# Patient Record
Sex: Female | Born: 1975 | Race: Black or African American | Hispanic: No | Marital: Married | Smoking: Never smoker
Health system: Southern US, Community
[De-identification: ages and names within clinical notes are randomized; demographics above are authoritative.]

## PROBLEM LIST (undated history)

## (undated) DIAGNOSIS — E079 Disorder of thyroid, unspecified: Secondary | ICD-10-CM

## (undated) DIAGNOSIS — G932 Benign intracranial hypertension: Secondary | ICD-10-CM

## (undated) DIAGNOSIS — I2699 Other pulmonary embolism without acute cor pulmonale: Secondary | ICD-10-CM

## (undated) DIAGNOSIS — F32A Depression, unspecified: Secondary | ICD-10-CM

## (undated) DIAGNOSIS — M797 Fibromyalgia: Secondary | ICD-10-CM

## (undated) DIAGNOSIS — I7300 Raynaud's syndrome without gangrene: Secondary | ICD-10-CM

## (undated) DIAGNOSIS — Z8719 Personal history of other diseases of the digestive system: Secondary | ICD-10-CM

## (undated) DIAGNOSIS — H539 Unspecified visual disturbance: Secondary | ICD-10-CM

## (undated) DIAGNOSIS — K589 Irritable bowel syndrome without diarrhea: Secondary | ICD-10-CM

## (undated) DIAGNOSIS — J45909 Unspecified asthma, uncomplicated: Secondary | ICD-10-CM

## (undated) DIAGNOSIS — K219 Gastro-esophageal reflux disease without esophagitis: Secondary | ICD-10-CM

## (undated) DIAGNOSIS — F329 Major depressive disorder, single episode, unspecified: Secondary | ICD-10-CM

## (undated) DIAGNOSIS — D649 Anemia, unspecified: Secondary | ICD-10-CM

## (undated) DIAGNOSIS — I10 Essential (primary) hypertension: Secondary | ICD-10-CM

## (undated) DIAGNOSIS — F419 Anxiety disorder, unspecified: Secondary | ICD-10-CM

## (undated) HISTORY — DX: Depression, unspecified: F32.A

## (undated) HISTORY — DX: Raynaud's syndrome without gangrene: I73.00

## (undated) HISTORY — DX: Fibromyalgia: M79.7

## (undated) HISTORY — DX: Anemia, unspecified: D64.9

## (undated) HISTORY — PX: RHINOPLASTY: SUR1284

## (undated) HISTORY — DX: Benign intracranial hypertension: G93.2

## (undated) HISTORY — DX: Major depressive disorder, single episode, unspecified: F32.9

## (undated) HISTORY — DX: Unspecified visual disturbance: H53.9

## (undated) HISTORY — DX: Disorder of thyroid, unspecified: E07.9

## (undated) HISTORY — DX: Essential (primary) hypertension: I10

## (undated) HISTORY — DX: Irritable bowel syndrome, unspecified: K58.9

## (undated) HISTORY — DX: Gastro-esophageal reflux disease without esophagitis: K21.9

## (undated) HISTORY — DX: Anxiety disorder, unspecified: F41.9

---

## 2001-01-16 ENCOUNTER — Emergency Department (HOSPITAL_COMMUNITY): Admission: EM | Admit: 2001-01-16 | Discharge: 2001-01-16 | Payer: Self-pay | Admitting: *Deleted

## 2002-08-10 HISTORY — PX: TONSILECTOMY, ADENOIDECTOMY, BILATERAL MYRINGOTOMY AND TUBES: SHX2538

## 2003-11-17 ENCOUNTER — Emergency Department (HOSPITAL_COMMUNITY): Admission: AD | Admit: 2003-11-17 | Discharge: 2003-11-17 | Payer: Self-pay | Admitting: Family Medicine

## 2005-02-05 ENCOUNTER — Emergency Department: Payer: Self-pay | Admitting: Emergency Medicine

## 2005-02-06 ENCOUNTER — Other Ambulatory Visit: Payer: Self-pay

## 2005-06-17 ENCOUNTER — Inpatient Hospital Stay: Payer: Self-pay

## 2005-06-17 ENCOUNTER — Other Ambulatory Visit: Payer: Self-pay

## 2007-04-20 ENCOUNTER — Ambulatory Visit: Payer: Self-pay | Admitting: Family Medicine

## 2007-06-01 ENCOUNTER — Ambulatory Visit: Payer: Self-pay | Admitting: General Surgery

## 2007-07-15 ENCOUNTER — Ambulatory Visit: Payer: Self-pay | Admitting: Unknown Physician Specialty

## 2007-07-20 ENCOUNTER — Ambulatory Visit: Payer: Self-pay | Admitting: Unknown Physician Specialty

## 2007-07-21 ENCOUNTER — Ambulatory Visit: Payer: Self-pay | Admitting: Unknown Physician Specialty

## 2007-11-30 ENCOUNTER — Emergency Department: Payer: Self-pay | Admitting: Emergency Medicine

## 2008-11-08 ENCOUNTER — Ambulatory Visit: Payer: Self-pay | Admitting: Internal Medicine

## 2008-11-14 ENCOUNTER — Ambulatory Visit: Payer: Self-pay | Admitting: Internal Medicine

## 2008-12-08 ENCOUNTER — Ambulatory Visit: Payer: Self-pay | Admitting: Internal Medicine

## 2008-12-17 ENCOUNTER — Ambulatory Visit: Payer: Self-pay | Admitting: Family Medicine

## 2008-12-21 ENCOUNTER — Ambulatory Visit: Payer: Self-pay

## 2009-03-05 ENCOUNTER — Encounter: Payer: Self-pay | Admitting: Neurology

## 2009-03-10 ENCOUNTER — Encounter: Payer: Self-pay | Admitting: Neurology

## 2009-07-29 ENCOUNTER — Emergency Department: Payer: Self-pay | Admitting: Emergency Medicine

## 2009-09-04 ENCOUNTER — Ambulatory Visit: Payer: Self-pay | Admitting: Hematology and Oncology

## 2010-10-20 ENCOUNTER — Inpatient Hospital Stay (INDEPENDENT_AMBULATORY_CARE_PROVIDER_SITE_OTHER)
Admission: RE | Admit: 2010-10-20 | Discharge: 2010-10-20 | Disposition: A | Discharge status: Home / Self Care | Payer: Self-pay | Source: Ambulatory Visit | Attending: Family Medicine | Admitting: Family Medicine

## 2010-10-20 DIAGNOSIS — R21 Rash and other nonspecific skin eruption: Secondary | ICD-10-CM

## 2010-10-20 DIAGNOSIS — R6889 Other general symptoms and signs: Secondary | ICD-10-CM

## 2010-11-04 ENCOUNTER — Emergency Department (HOSPITAL_COMMUNITY)
Admission: EM | Admit: 2010-11-04 | Discharge: 2010-11-05 | Disposition: A | Discharge status: Home / Self Care | Payer: Self-pay | Attending: Emergency Medicine | Admitting: Emergency Medicine

## 2010-11-04 ENCOUNTER — Emergency Department (HOSPITAL_COMMUNITY): Payer: Self-pay

## 2010-11-04 DIAGNOSIS — R112 Nausea with vomiting, unspecified: Secondary | ICD-10-CM | POA: Insufficient documentation

## 2010-11-04 DIAGNOSIS — N390 Urinary tract infection, site not specified: Secondary | ICD-10-CM | POA: Insufficient documentation

## 2010-11-04 DIAGNOSIS — R780 Finding of alcohol in blood: Secondary | ICD-10-CM | POA: Insufficient documentation

## 2010-11-04 DIAGNOSIS — N898 Other specified noninflammatory disorders of vagina: Secondary | ICD-10-CM | POA: Insufficient documentation

## 2010-11-04 DIAGNOSIS — I10 Essential (primary) hypertension: Secondary | ICD-10-CM | POA: Insufficient documentation

## 2010-11-04 DIAGNOSIS — Z79899 Other long term (current) drug therapy: Secondary | ICD-10-CM | POA: Insufficient documentation

## 2010-11-04 LAB — CBC
Hemoglobin: 12 g/dL (ref 12.0–15.0)
MCH: 27.1 pg (ref 26.0–34.0)
MCHC: 34.5 g/dL (ref 30.0–36.0)
RDW: 16 % — ABNORMAL HIGH (ref 11.5–15.5)

## 2010-11-04 LAB — URINALYSIS, ROUTINE W REFLEX MICROSCOPIC
Bilirubin Urine: NEGATIVE
Glucose, UA: NEGATIVE mg/dL
Ketones, ur: NEGATIVE mg/dL
Nitrite: NEGATIVE
Protein, ur: NEGATIVE mg/dL
Specific Gravity, Urine: 1.018 (ref 1.005–1.030)
Urobilinogen, UA: 0.2 mg/dL (ref 0.0–1.0)
pH: 5.5 (ref 5.0–8.0)

## 2010-11-04 LAB — COMPREHENSIVE METABOLIC PANEL
AST: 15 U/L (ref 0–37)
CO2: 25 mEq/L (ref 19–32)
Calcium: 8.9 mg/dL (ref 8.4–10.5)
Creatinine, Ser: 0.85 mg/dL (ref 0.4–1.2)
GFR calc Af Amer: >60 mL/min (ref >60)
GFR calc non Af Amer: >60 mL/min (ref >60)

## 2010-11-04 LAB — URINE MICROSCOPIC-ADD ON

## 2010-11-04 LAB — DIFFERENTIAL
Basophils Absolute: 0 10*3/uL (ref 0.0–0.1)
Basophils Relative: 0 % (ref 0–1)
Eosinophils Absolute: 0.2 10*3/uL (ref 0.0–0.7)
Eosinophils Relative: 2 % (ref 0–5)
Lymphocytes Relative: 28 % (ref 12–46)
Lymphs Abs: 2.9 10*3/uL (ref 0.7–4.0)
Monocytes Absolute: 0.6 10*3/uL (ref 0.1–1.0)
Monocytes Relative: 6 % (ref 3–12)
Neutro Abs: 6.5 10*3/uL (ref 1.7–7.7)
Neutrophils Relative %: 64 % (ref 43–77)

## 2010-11-04 LAB — WET PREP, GENITAL
Clue Cells Wet Prep HPF POC: NONE SEEN
Yeast Wet Prep HPF POC: NONE SEEN

## 2010-11-04 LAB — POCT PREGNANCY, URINE: Preg Test, Ur: NEGATIVE

## 2011-06-12 DIAGNOSIS — F411 Generalized anxiety disorder: Secondary | ICD-10-CM | POA: Insufficient documentation

## 2012-03-18 ENCOUNTER — Emergency Department: Payer: Self-pay | Admitting: Emergency Medicine

## 2012-03-18 LAB — URINALYSIS, COMPLETE
Bilirubin,UR: NEGATIVE
Blood: NEGATIVE
Glucose,UR: NEGATIVE mg/dL (ref 0–75)
Ketone: NEGATIVE
Leukocyte Esterase: NEGATIVE
Ph: 5 (ref 4.5–8.0)
Specific Gravity: 1.016 (ref 1.003–1.030)
Squamous Epithelial: 2

## 2012-03-18 LAB — PREGNANCY, URINE: Pregnancy Test, Urine: NEGATIVE m[IU]/mL

## 2012-03-18 LAB — COMPREHENSIVE METABOLIC PANEL
BUN: 12 mg/dL (ref 7–18)
Bilirubin,Total: 0.3 mg/dL (ref 0.2–1.0)
Calcium, Total: 8.8 mg/dL (ref 8.5–10.1)
Chloride: 109 mmol/L — ABNORMAL HIGH (ref 98–107)
Co2: 26 mmol/L (ref 21–32)
EGFR (Non-African Amer.): >60
Potassium: 3.7 mmol/L (ref 3.5–5.1)
SGOT(AST): 12 U/L — ABNORMAL LOW (ref 15–37)
SGPT (ALT): 14 U/L (ref 12–78)

## 2012-03-18 LAB — CBC WITH DIFFERENTIAL/PLATELET
Basophil #: 0.1 10*3/uL (ref 0.0–0.1)
Eosinophil %: 0.7 %
Lymphocyte #: 2.6 10*3/uL (ref 1.0–3.6)
Lymphocyte %: 25.7 %
Monocyte #: 0.7 x10 3/mm (ref 0.2–0.9)
Monocyte %: 6.5 %
Neutrophil %: 66.5 %
Platelet: 411 10*3/uL (ref 150–440)
RDW: 15 % — ABNORMAL HIGH (ref 11.5–14.5)
WBC: 10.1 10*3/uL (ref 3.6–11.0)

## 2012-06-24 DIAGNOSIS — R42 Dizziness and giddiness: Secondary | ICD-10-CM | POA: Insufficient documentation

## 2012-12-08 DIAGNOSIS — R7989 Other specified abnormal findings of blood chemistry: Secondary | ICD-10-CM | POA: Insufficient documentation

## 2012-12-08 DIAGNOSIS — E229 Hyperfunction of pituitary gland, unspecified: Secondary | ICD-10-CM

## 2012-12-08 DIAGNOSIS — G932 Benign intracranial hypertension: Secondary | ICD-10-CM | POA: Insufficient documentation

## 2013-01-25 ENCOUNTER — Ambulatory Visit: Payer: Self-pay | Admitting: Obstetrics and Gynecology

## 2013-01-25 LAB — HEMOGLOBIN: HGB: 11.8 g/dL — ABNORMAL LOW (ref 12.0–16.0)

## 2013-01-25 LAB — BASIC METABOLIC PANEL
BUN: 11 mg/dL (ref 7–18)
Calcium, Total: 8.9 mg/dL (ref 8.5–10.1)
Chloride: 111 mmol/L — ABNORMAL HIGH (ref 98–107)
EGFR (African American): >60
EGFR (Non-African Amer.): >60
Sodium: 141 mmol/L (ref 136–145)

## 2013-02-06 ENCOUNTER — Ambulatory Visit: Payer: Self-pay | Admitting: Obstetrics and Gynecology

## 2013-02-06 HISTORY — PX: ABDOMINAL HYSTERECTOMY: SHX81

## 2013-02-07 LAB — BASIC METABOLIC PANEL
BUN: 7 mg/dL (ref 7–18)
Co2: 25 mmol/L (ref 21–32)
Creatinine: 1.01 mg/dL (ref 0.60–1.30)
EGFR (Non-African Amer.): >60
Osmolality: 279 (ref 275–301)
Sodium: 140 mmol/L (ref 136–145)

## 2013-02-07 LAB — HEMATOCRIT: HCT: 31.7 % — ABNORMAL LOW (ref 35.0–47.0)

## 2013-02-07 LAB — PATHOLOGY REPORT

## 2013-03-22 ENCOUNTER — Ambulatory Visit: Payer: Self-pay | Admitting: Neurology

## 2013-03-22 LAB — PROTIME-INR: INR: 1

## 2013-03-22 LAB — ALBUMIN: Albumin: 3.4 g/dL (ref 3.4–5.0)

## 2013-03-22 LAB — CSF CELL COUNT WITH DIFFERENTIAL
CSF Tube #: 4
RBC (CSF): 18 /mm3
WBC (CSF): 0 /mm3

## 2013-03-25 LAB — CSF CULTURE W GRAM STAIN

## 2013-04-26 DIAGNOSIS — M791 Myalgia, unspecified site: Secondary | ICD-10-CM | POA: Insufficient documentation

## 2013-04-26 DIAGNOSIS — D219 Benign neoplasm of connective and other soft tissue, unspecified: Secondary | ICD-10-CM | POA: Insufficient documentation

## 2013-04-26 DIAGNOSIS — A499 Bacterial infection, unspecified: Secondary | ICD-10-CM | POA: Insufficient documentation

## 2013-04-26 DIAGNOSIS — I10 Essential (primary) hypertension: Secondary | ICD-10-CM | POA: Insufficient documentation

## 2013-04-26 DIAGNOSIS — R519 Headache, unspecified: Secondary | ICD-10-CM | POA: Insufficient documentation

## 2013-04-26 DIAGNOSIS — H5710 Ocular pain, unspecified eye: Secondary | ICD-10-CM | POA: Insufficient documentation

## 2013-04-26 DIAGNOSIS — N390 Urinary tract infection, site not specified: Secondary | ICD-10-CM | POA: Insufficient documentation

## 2013-04-26 DIAGNOSIS — R51 Headache: Secondary | ICD-10-CM

## 2013-05-10 HISTORY — PX: BREAST BIOPSY: SHX20

## 2013-05-29 ENCOUNTER — Encounter: Payer: Self-pay | Admitting: General Surgery

## 2013-05-29 ENCOUNTER — Ambulatory Visit (INDEPENDENT_AMBULATORY_CARE_PROVIDER_SITE_OTHER): Payer: Medicaid Other | Admitting: General Surgery

## 2013-05-29 ENCOUNTER — Other Ambulatory Visit: Payer: Self-pay | Admitting: General Surgery

## 2013-05-29 VITALS — BP 120/80 | HR 78 | Resp 14 | Ht 63.0 in | Wt 310.0 lb

## 2013-05-29 DIAGNOSIS — N6012 Diffuse cystic mastopathy of left breast: Secondary | ICD-10-CM | POA: Insufficient documentation

## 2013-05-29 DIAGNOSIS — N63 Unspecified lump in unspecified breast: Secondary | ICD-10-CM | POA: Insufficient documentation

## 2013-05-29 NOTE — Patient Instructions (Signed)

## 2013-05-29 NOTE — Progress Notes (Signed)
Patient ID: Angela [name], female   DOB: 06/29/1976, 37 y.o.   MRN: [id number]  Chief Complaint  Patient presents with  . Other    breast mass    HPI Angela [name] is a 37 y.o. female who presents for a breast evaluation. The most recent mammogram   was done on 05/18/13 at [facility]. Patient does perform regular self breast checks. She states she has had that lump in her right breast for years and now she states it is getting bigger and very sore . The patient was seen in 2009 for a small lesion beneath the areola of the right breast. Ultrasound that time showed an 8 mm well-circumscribed hypoechoic lesion thought likely represented skin sister breast fibroadenoma. Observation was felt reasonable. This area was at the 6:00 position the breast. The area of patient concern at this time is slightly lateral at the 8:00 position. There is no history of trauma. The patient has experienced massive weight gain since her 2009 exam up (upwards of 100 pounds) reportedly related to steroid administration for her medical conditions. The area as become more pronounced over the past year, especially in the past 4 months.    HPI  Past Medical History  Diagnosis Date  . Hypertension   . Depression   . Anemia   . Thyroid disease     hypothyroid  . Pseudotumor cerebri   . Fibromyalgia   . GERD (gastroesophageal reflux disease)     Past Surgical History  Procedure Laterality Date  . Abdominal hysterectomy  02/06/13  . Tonsilectomy, adenoidectomy, bilateral myringotomy and tubes  2004  . Rhinoplasty    . Cesarean section  1993, 1996, 1997    No family history on file.  Social History History  Substance Use Topics  . Smoking status: Never Smoker   . Smokeless tobacco: Never Used  . Alcohol Use: Yes    Allergies  Allergen Reactions  . Amoxicillin Hives  . Other     Sulfa eye drops- Scratched corneas  . Penicillins Hives    Current Outpatient Prescriptions  Medication Sig  Dispense Refill  . acetaZOLAMIDE (DIAMOX) 500 MG capsule Take 500 mg by mouth 2 (two) times daily.      Marland Kitchen albuterol (PROVENTIL HFA;VENTOLIN HFA) 108 (90 BASE) MCG/ACT inhaler Inhale 2 puffs into the lungs every 6 (six) hours as needed for wheezing.      Marland Kitchen amitriptyline (ELAVIL) 50 MG tablet Take 50 mg by mouth at bedtime.      . Biotin 1000 MCG tablet Take 1,000 mcg by mouth 4 (four) times a week.      . caffeine 200 MG TABS tablet Take 200 mg by mouth every 4 (four) hours as needed.      Marland Kitchen levothyroxine (SYNTHROID, LEVOTHROID) 125 MCG tablet Take 125 mcg by mouth daily before breakfast.      . prednisoLONE acetate (PRED FORTE) 1 % ophthalmic suspension Place 1 drop into both eyes every 4 (four) hours.      . topiramate (TOPAMAX) 25 MG tablet Take 75 mg by mouth daily.       No current facility-administered medications for this visit.    Review of Systems Review of Systems  Constitutional: Negative.   Respiratory: Negative.   Cardiovascular: Negative.     Blood pressure 120/80, pulse 78, resp. rate 14, height 5\' 3"  (1.6 m), weight 310 lb (140.615 kg).  Physical Exam Physical Exam  Constitutional: She is oriented to person, place, and time. She appears  well-developed and well-nourished.  Eyes: No scleral icterus.  Cardiovascular: Normal rate, regular rhythm and normal heart sounds.   Pulmonary/Chest: Breath sounds normal. Right breast exhibits no inverted nipple, no nipple discharge, no skin change and no tenderness. Left breast exhibits no inverted nipple, no mass, no nipple discharge, no skin change and no tenderness.  8-10 mm nodular under the areloar left breast 8 o'clock. Nontender.  Lymphadenopathy:    She has no cervical adenopathy.    She has no axillary adenopathy.  Neurological: She is alert and oriented to person, place, and time.  Skin: Skin is warm and dry.    Data Reviewed 05/18/2013 [facility] mammograms were reviewed. A 1.2 cm oval, macrolobulated circumscribed  mass in the right breast thought to represent a papilloma fibroadenoma. Dilated ducts were not identified. BI-RAD 3. Biopsy recommended in the absence of clinical contraindications.  Ultrasound examination of the right breast in the 8:00 position 3 cm from the nipple showed a irregular 1.25 x 1.4 x 1.46 cm hypoechoic mass. The patient was amenable to core biopsy.  10 cc of 0.5% Xylocaine with 0.25% Marcaine with 1 2000 of epinephrine was utilized well tolerated. A 14-gauge Finesse device was used to obtain multiple samples from the area of concern. Scant bleeding was noted. Skin defect closed with benzoin and Steri-Strips. Telfa and Tegaderm dressing applied. Written instructions for postoperative wound care provided.  Assessment    Right breast mass    Plan    The patient will be contacted when biopsy results are available. She'll plan on nursing visit in one week for a wound check.       Earline Mayotte 05/30/2013, 7:12 AM

## 2013-05-31 ENCOUNTER — Telehealth: Payer: Self-pay | Admitting: *Deleted

## 2013-05-31 LAB — PATHOLOGY

## 2013-05-31 NOTE — Telephone Encounter (Signed)
Notified patient as instructed, patient pleased. Discussed follow-up appointments with nurse next week, patient agrees.  Will need to make f/u with MD at this time.

## 2013-05-31 NOTE — Telephone Encounter (Signed)
Message copied by Currie Paris on Wed May 31, 2013 11:05 AM ------      Message from: [place], Merrily Pew      Created: Wed May 31, 2013  8:40 AM       Please notify the patient the biopsy was fine. Need MD f/u in six weeks to re-assess.       ----- Message -----         From: Milas Kocher, CMA         Sent: 05/31/2013  12:42 AM           To: Earline Mayotte, MD                   ------

## 2013-06-01 ENCOUNTER — Encounter: Payer: Self-pay | Admitting: General Surgery

## 2013-06-05 ENCOUNTER — Telehealth: Payer: Self-pay | Admitting: *Deleted

## 2013-06-05 ENCOUNTER — Ambulatory Visit (INDEPENDENT_AMBULATORY_CARE_PROVIDER_SITE_OTHER): Payer: Medicaid Other | Admitting: *Deleted

## 2013-06-05 DIAGNOSIS — N63 Unspecified lump in unspecified breast: Secondary | ICD-10-CM

## 2013-06-05 NOTE — Patient Instructions (Signed)
Patient to return as scheduled.  

## 2013-06-05 NOTE — Progress Notes (Signed)
Patient here today for follow up post breast biopsy. Dressing and steri strips removed prior to visit today. No bruising noted.  The patient is aware that a heating pad may be used for comfort as needed. Aware of pathology. Follow up as scheduled.

## 2013-06-05 NOTE — Telephone Encounter (Signed)
Message for patient to call the office.  She needs a 6 week follow up arranged.

## 2013-06-08 ENCOUNTER — Telehealth: Payer: Self-pay | Admitting: *Deleted

## 2013-06-08 ENCOUNTER — Encounter: Payer: Self-pay | Admitting: *Deleted

## 2013-06-08 NOTE — Telephone Encounter (Signed)
Another attempt was made to contact patient with no luck. We will arrange a 6 week follow up appointment and mail a letter to the patient with date, time, and instructions.

## 2013-06-29 ENCOUNTER — Telehealth: Payer: Self-pay | Admitting: General Surgery

## 2013-06-29 NOTE — Telephone Encounter (Signed)
v

## 2013-07-10 HISTORY — PX: BREAST BIOPSY: SHX20

## 2013-07-20 ENCOUNTER — Ambulatory Visit: Payer: Medicaid Other | Admitting: General Surgery

## 2013-07-24 ENCOUNTER — Encounter: Payer: Self-pay | Admitting: General Surgery

## 2013-07-24 ENCOUNTER — Ambulatory Visit (INDEPENDENT_AMBULATORY_CARE_PROVIDER_SITE_OTHER): Payer: Medicaid Other | Admitting: General Surgery

## 2013-07-24 ENCOUNTER — Other Ambulatory Visit: Payer: Medicaid Other

## 2013-07-24 VITALS — BP 124/82 | HR 80 | Resp 16 | Ht 63.0 in | Wt 307.0 lb

## 2013-07-24 DIAGNOSIS — N63 Unspecified lump in unspecified breast: Secondary | ICD-10-CM

## 2013-07-24 NOTE — Progress Notes (Signed)
Patient ID: Angela [name], female   DOB: 02-Aug-1976, 37 y.o.   MRN: [id number]  Chief Complaint  Patient presents with  . Follow-up    right breast biopsy    HPI Angela [name] is a 37 y.o. female who presents for a follow up right breast biopsy. The procedure was performed on 05/30/13. The patient denies any new problems with the breast at this time.   HPI  Past Medical History  Diagnosis Date  . Hypertension   . Depression   . Anemia   . Thyroid disease     hypothyroid  . Pseudotumor cerebri   . Fibromyalgia   . GERD (gastroesophageal reflux disease)     Past Surgical History  Procedure Laterality Date  . Abdominal hysterectomy  02/06/13  . Tonsilectomy, adenoidectomy, bilateral myringotomy and tubes  2004  . Rhinoplasty    . Cesarean section  1993, 1996, 1997  . Breast biopsy Right 2014    No family history on file.  Social History History  Substance Use Topics  . Smoking status: Never Smoker   . Smokeless tobacco: Never Used  . Alcohol Use: Yes    Allergies  Allergen Reactions  . Amoxicillin Hives  . Other     Sulfa eye drops- Scratched corneas  . Penicillins Hives    Current Outpatient Prescriptions  Medication Sig Dispense Refill  . acetaZOLAMIDE (DIAMOX) 500 MG capsule Take 500 mg by mouth 2 (two) times daily.      Marland Kitchen albuterol (PROVENTIL HFA;VENTOLIN HFA) 108 (90 BASE) MCG/ACT inhaler Inhale 2 puffs into the lungs every 6 (six) hours as needed for wheezing.      Marland Kitchen amitriptyline (ELAVIL) 50 MG tablet Take 50 mg by mouth at bedtime.      . Biotin 1000 MCG tablet Take 1,000 mcg by mouth 4 (four) times a week.      . caffeine 200 MG TABS tablet Take 200 mg by mouth every 4 (four) hours as needed.      . folic acid (FOLVITE) 1 MG tablet Take 1 mg by mouth daily.      Marland Kitchen levothyroxine (SYNTHROID, LEVOTHROID) 125 MCG tablet Take 125 mcg by mouth daily before breakfast.      . methotrexate (RHEUMATREX) 15 MG tablet Take 15 mg by mouth once a  week. Caution: Chemotherapy. Protect from light.      . prednisoLONE acetate (PRED FORTE) 1 % ophthalmic suspension Place 1 drop into both eyes every 4 (four) hours.      . topiramate (TOPAMAX) 25 MG tablet Take 75 mg by mouth daily.       No current facility-administered medications for this visit.    Review of Systems Review of Systems  Constitutional: Negative.   Respiratory: Negative.   Cardiovascular: Negative.     Blood pressure 124/82, pulse 80, resp. rate 16, height 5\' 3"  (1.6 m), weight 307 lb (139.254 kg).  Physical Exam Physical Exam  Constitutional: She is oriented to person, place, and time. She appears well-developed and well-nourished.  Pulmonary/Chest: Right breast exhibits no inverted nipple, no mass, no nipple discharge, no skin change and no tenderness. Left breast exhibits no inverted nipple, no mass, no nipple discharge, no skin change and no tenderness. Breasts are symmetrical.       Lymphadenopathy:    She has no cervical adenopathy.    She has no axillary adenopathy.  Neurological: She is alert and oriented to person, place, and time.  Skin: Skin is warm and dry.  Data Reviewed Ultrasound examination of the area of palpable thickening shows a bilobed area measuring 0.9 cm in greatest diameter. The patient was amenable to FNA sampling. This was completed using 1 cc of 1% plain Xylocaine. Multiple passes of the lesion were completed and slides x4 were prepared for cytology.  Assessment    Fibroadenoma right breast, asymptomatic.    Plan    The patient will be contacted when cytology report is available. Assuming a benign report, arrangements will made for followup exam and screening mammogram in fall 2015.        Earline Mayotte 07/26/2013, 7:57 AM

## 2013-07-26 LAB — FINE-NEEDLE ASPIRATION

## 2013-07-27 ENCOUNTER — Telehealth: Payer: Self-pay | Admitting: *Deleted

## 2013-07-27 NOTE — Telephone Encounter (Signed)
Pt called at 9:15, stated that you called her and she missed your call. Her best number to reach her is [phone]

## 2013-07-27 NOTE — Telephone Encounter (Signed)
Notified patient as instructed, patient pleased. Discussed follow-up appointments, patient agrees  

## 2013-07-27 NOTE — Telephone Encounter (Signed)
Completed.

## 2013-07-27 NOTE — Telephone Encounter (Signed)
Message copied by Currie Paris on Thu Jul 27, 2013  9:13 AM ------      Message from: [place], [place] W      Created: Thu Jul 27, 2013  8:44 AM       Please notify the patient that the needle sample from the right breast obtained on her December 15 exam shows a benign, fibroadenoma. Normal breast cells tightly packed together.            We'll keep our plan is to arrange for bilateral screening mammograms in fall 2015 with office visit to follow. She should call she appreciates any changes in her breasts            ----- Message -----         From: [facility] Lab Results In Interface         Sent: 07/26/2013   4:41 PM           To: Earline Mayotte, MD                   ------

## 2013-08-10 HISTORY — PX: UPPER GASTROINTESTINAL ENDOSCOPY: SHX188

## 2013-08-10 HISTORY — PX: COLONOSCOPY: SHX174

## 2013-08-16 ENCOUNTER — Ambulatory Visit: Payer: Self-pay | Admitting: Neurology

## 2013-12-26 ENCOUNTER — Ambulatory Visit: Payer: Self-pay | Admitting: Gastroenterology

## 2013-12-27 LAB — PATHOLOGY REPORT

## 2014-01-09 ENCOUNTER — Ambulatory Visit: Payer: Medicaid Other | Admitting: General Surgery

## 2014-05-23 ENCOUNTER — Ambulatory Visit (INDEPENDENT_AMBULATORY_CARE_PROVIDER_SITE_OTHER): Payer: Medicaid Other | Admitting: General Surgery

## 2014-05-23 ENCOUNTER — Other Ambulatory Visit: Payer: Medicaid Other

## 2014-05-23 ENCOUNTER — Encounter: Payer: Self-pay | Admitting: General Surgery

## 2014-05-23 VITALS — BP 136/84 | HR 72 | Resp 14 | Ht 63.0 in | Wt 303.0 lb

## 2014-05-23 DIAGNOSIS — N63 Unspecified lump in breast: Secondary | ICD-10-CM

## 2014-05-23 DIAGNOSIS — N6310 Unspecified lump in the right breast, unspecified quadrant: Secondary | ICD-10-CM

## 2014-05-23 NOTE — Progress Notes (Signed)
Patient ID: Angela [name], female   DOB: 1976/07/22, 38 y.o.   MRN: [id number]  Chief Complaint  Patient presents with  . Follow-up    mammogram    HPI Angela [name] is a 38 y.o. female who presents for a breast evaluation and office ultrasound. The most recent mammogram was done on 05/22/14.  Patient does perform regular self breast checks and gets regular mammograms done.  No new breast issues, only occasional "tingling" pain. Minimal nipple discharge with monthly checks.  Bra size GGG. She has talked about gastric bypass but her thyroid is not stable.  HPI  Past Medical History  Diagnosis Date  . Hypertension   . Depression   . Anemia   . Thyroid disease     hypothyroid  . Pseudotumor cerebri   . Fibromyalgia   . GERD (gastroesophageal reflux disease)   . Anxiety   . Irritable bowel syndrome (IBS)   . Vision disturbance     Past Surgical History  Procedure Laterality Date  . Abdominal hysterectomy  02/06/13  . Tonsilectomy, adenoidectomy, bilateral myringotomy and tubes  2004  . Rhinoplasty    . Cesarean section  1993, 1996, 1997  . Upper gastrointestinal endoscopy  2015    Dr Allen Norris  . Colonoscopy  2015    Dr Allen Norris  . Breast biopsy Right October, 2014    right breast core biopsy, fibroadenomatous changes  . Breast biopsy Right December 2014    FNA retroareolar nodule consistent with fibroadenoma.    No family history on file.  Social History History  Substance Use Topics  . Smoking status: Never Smoker   . Smokeless tobacco: Never Used  . Alcohol Use: Yes    Allergies  Allergen Reactions  . Amoxicillin Hives  . Other     Sulfa eye drops- Scratched corneas  . Penicillins Hives    Current Outpatient Prescriptions  Medication Sig Dispense Refill  . albuterol (PROVENTIL HFA;VENTOLIN HFA) 108 (90 BASE) MCG/ACT inhaler Inhale 2 puffs into the lungs every 6 (six) hours as needed for wheezing.      . Biotin 1000 MCG tablet Take 1,000 mcg by mouth  4 (four) times a week.      . clonazePAM (KLONOPIN) 1 MG tablet Take 1 mg by mouth 3 (three) times daily.      Marland Kitchen dicyclomine (BENTYL) 10 MG capsule Take 10 mg by mouth 4 (four) times daily -  before meals and at bedtime.      . hydrochlorothiazide (HYDRODIURIL) 25 MG tablet Take 25 mg by mouth daily.      Marland Kitchen levothyroxine (SYNTHROID, LEVOTHROID) 125 MCG tablet Take 150 mcg by mouth daily before breakfast.       . oxycodone (OXY-IR) 5 MG capsule Take 5 mg by mouth 2 (two) times daily.      . prednisoLONE acetate (PRED FORTE) 1 % ophthalmic suspension Place 1 drop into both eyes every 4 (four) hours.      . topiramate (TOPAMAX) 25 MG tablet Take 75 mg by mouth daily.      . Vortioxetine HBr (BRINTELLIX) 10 MG TABS Take by mouth daily.       No current facility-administered medications for this visit.    Review of Systems Review of Systems  Constitutional: Negative.   Respiratory: Negative.   Cardiovascular: Negative.     Blood pressure 136/84, pulse 72, resp. rate 14, height 5\' 3"  (1.6 m), weight 303 lb (137.44 kg).  Physical Exam Physical Exam  Constitutional:  She is oriented to person, place, and time. She appears well-developed and well-nourished.  Neck: Neck supple.  Cardiovascular: Normal rate, regular rhythm and normal heart sounds.   Pulmonary/Chest: Effort normal and breath sounds normal. Right breast exhibits mass. Right breast exhibits no inverted nipple, no nipple discharge, no skin change and no tenderness. Left breast exhibits no inverted nipple, no mass, no nipple discharge, no skin change and no tenderness.  Nodule base of right nipple, unchanged.  Lymphadenopathy:    She has no cervical adenopathy.    She has no axillary adenopathy.  Neurological: She is alert and oriented to person, place, and time.  Skin: Skin is warm and dry.    Data Reviewed Bilateral mammograms is May 22, 2014 completed [facility] showed no interval change. BI-RAD-2.  Ultrasound  examination of the retroareolar nodule previously managed by FNA showed a bilobed lesion measuring 0.8 x 0.9 x 0.9 cm as well as 0.4 x 0.5 x 0.5 cm. Both areas are smoothly marginated, she would posterior acoustic enhancement and smooth borders. BI-RAD-2.  Assessment    Retroareolar fibroadenoma.     Plan    The patient is on multiple medications, and for breast tenderness she may obtain some back with the use of antioxidants.  She discussed breast reduction surgery. Her better overall strategy would be bariatric surgery. Follow up in one year with bilateral screening mammograms and office visit.        PCP: Karie Soda 05/24/2014, 6:22 AM

## 2014-05-23 NOTE — Patient Instructions (Addendum)
,  Continue self breast exams. Call office for any new breast issues or concerns. Follow up in one year with bilateral screening mammograms and office visit. May use antioxidants, Protegra or ocuvite

## 2014-05-24 ENCOUNTER — Encounter: Payer: Self-pay | Admitting: General Surgery

## 2014-05-24 DIAGNOSIS — N6310 Unspecified lump in the right breast, unspecified quadrant: Secondary | ICD-10-CM | POA: Insufficient documentation

## 2014-06-11 ENCOUNTER — Encounter: Payer: Self-pay | Admitting: General Surgery

## 2014-10-05 ENCOUNTER — Ambulatory Visit: Payer: Self-pay | Admitting: Anesthesiology

## 2014-10-09 DIAGNOSIS — M069 Rheumatoid arthritis, unspecified: Secondary | ICD-10-CM | POA: Insufficient documentation

## 2014-11-30 NOTE — Op Note (Signed)
PATIENT NAME:  Angela [name], Angela [name] MR#:  [id number] DATE OF BIRTH:  Jan 12, 1976  DATE OF PROCEDURE:  02/06/2013  PREOPERATIVE DIAGNOSES:  1.  Menorrhagia, failed endometrial ablation. 2.  Fibroid uterus.   POSTOPERATIVE DIAGNOSES:  1.  Menorrhagia, failed endometrial ablation. 2.  Fibroid uterus.   PROCEDURE: Laparoscopic supracervical hysterectomy.   SURGEON: Boykin Nearing, MD   FIRST ASSISTANT: Franchot Erichsen, MD  ANESTHESIA:  General endotracheal.   INDICATIONS: This is a 39 year old gravida 5 para 3.  The patient underwent endometrial ablation but had persistent menorrhagia. Postoperatively, the patient is known to have fibroid uterus.   DESCRIPTION OF PROCEDURE: After adequate general endotracheal anesthesia, the patient was prepped and draped in normal sterile fashion. Foley catheter placed into the bladder and single-tooth tenaculum placed on the anterior cervix.  After placing a uterine sound into the uterus, uterine sound and a single-tooth tenaculum were attached with Steri-Strips. Gloves were changed, and a 12 mm infraumbilical incision was made, and the laparoscope was advanced into the abdominal cavity under direct visualization with the Optiview cannula. The patient's abdomen was insufflated with carbon dioxide. A second port was placed below the pannus 2 cm medial to the anterior iliac spine.  Under direct visualization, a  10 mm port was advanced into the abdominal cavity. A third port site placed right lower quadrant, again 2 cm medial to the right anterior iliac spine.  Under direct visualization another 10 mm port was advanced into the abdominal cavity. The patient had large omental adhesion to the anterior abdominal wall. This was taken down with a Harmonic scalpel. Ultimately, when visualization allowed for grasping of the uterus, the right side of the uterus was grasped with a single-tooth tenaculum and the round ligament was then clamped and transected with the  Harmonic followed by clamping and transecting the utero-ovarian ligament. Sequential dissection of the broad ligament down to the left-sided uterine artery was accomplished. The uterine artery was cauterized and at the level of the uterosacral ligaments Harmonic scalpel transected the cervix half way across. A similar procedure was repeated on the patient's right side. After grasping the right side of the uterus and placing on traction, the round ligament and the utero-ovarian ligaments were transected and dissected free from the uterus. The right uterine artery was cauterized with the Kleppinger and transected with Harmonic scalpel, and the cervix was then transected with the Harmonic.  The uterine sound was removed and the rest of the cervical stump was visualized. Kleppinger was brought into the abdominal cavity and the cervical stump was then cauterized aided by a vaginal hand.  Good hemostasis noted. The uterus was then morcellated in standard fashion without difficulties. The ovaries and fallopian tubes appeared normal. The patient's abdomen was copiously irrigated. Good hemostasis noted. Ureters were identified bilaterally and there was normal peristaltic activity noted. Pressure was lowered to 7 mmHg.  Again good hemostasis was noted. Procedure was then terminated. The patient's abdomen was deflated, and the port sites were closed.  Left lower port site was closed with a fascial stitch of 2-0 Vicryl and all remaining incisions were closed with interrupted 4-0 Vicryl suture. Steri-Strips  applied and Tegaderm dressing placed. The single-tooth tenaculum was removed from the cervix, and the cervix was visualized and was noted to have good hemostasis. Estimated blood loss 100 mL. Intraoperative fluids 1 liter LR. Urine output 275 mL.  There were no complications. The patient did receive 900 mg intravenous clindamycin prior to commencement of the case.  ____________________________  Boykin Nearing,  MD tjs:sb D: 02/06/2013 12:17:43 ET T: 02/06/2013 12:27:40 ET JOB#: [id number]  cc: Boykin Nearing, MD, <Dictator> Boykin Nearing MD ELECTRONICALLY SIGNED 02/06/2013 13:24

## 2015-01-28 ENCOUNTER — Ambulatory Visit: Payer: Medicare Other | Attending: Rheumatology | Admitting: Physical Therapy

## 2015-01-28 ENCOUNTER — Encounter: Payer: Self-pay | Admitting: Physical Therapy

## 2015-01-28 DIAGNOSIS — M25511 Pain in right shoulder: Secondary | ICD-10-CM | POA: Diagnosis present

## 2015-01-28 DIAGNOSIS — M542 Cervicalgia: Secondary | ICD-10-CM | POA: Insufficient documentation

## 2015-01-28 NOTE — Patient Instructions (Signed)
All exercises provided were adapted from [email or web address]. Patient was provided a written handout with pictures as described. Any additional cues were manually entered in to handout and copied in to this document.    SCAPULAR RETRACTIONS (10 times, 3 second hold, 2 sets, 1 x per day)  Draw your shoulder blades back and down.     Thoracic Extension (10 times, 3 second hold, 1 set, 3 x per day)  Sitting on a chair with the top hitting your mid back, place your hands crossed on your shoulders.  Slowly tilt back so you are stretching your mid back.  Hold for 20-30 seconds.  Release and repeat.

## 2015-01-29 NOTE — Therapy (Signed)
[place] [facility] [address] [place], [address] Phone: [phone]   Fax:  [phone]  Physical Therapy Evaluation  Patient Details  Name: Angela [name] MRN: [id number] Date of Birth: 05/14/1976 Referring Provider:  Kristopher Glee, *  Encounter Date: 01/28/2015      PT End of Session - 01/28/15 1819    Visit Number 1   Number of Visits 13   Date for PT Re-Evaluation 03/25/15   Authorization Type Medicaid - 1    PT Start Time 4081   PT Stop Time 1528   PT Time Calculation (min) 43 min   Activity Tolerance Patient limited by pain   Behavior During Therapy [facility] for tasks assessed/performed;Flat affect      Past Medical History  Diagnosis Date  . Hypertension   . Depression   . Anemia   . Thyroid disease     hypothyroid  . Pseudotumor cerebri   . Fibromyalgia   . GERD (gastroesophageal reflux disease)   . Anxiety   . Irritable bowel syndrome (IBS)   . Vision disturbance     Past Surgical History  Procedure Laterality Date  . Abdominal hysterectomy  02/06/13  . Tonsilectomy, adenoidectomy, bilateral myringotomy and tubes  2004  . Rhinoplasty    . Cesarean section  1993, 1996, 1997  . Upper gastrointestinal endoscopy  2015    Dr Allen Norris  . Colonoscopy  2015    Dr Allen Norris  . Breast biopsy Right October, 2014    right breast core biopsy, fibroadenomatous changes  . Breast biopsy Right December 2014    FNA retroareolar nodule consistent with fibroadenoma.    There were no vitals filed for this visit.  Visit Diagnosis:  Right shoulder pain - Plan: PT plan of care cert/re-cert  Cervicalgia - Plan: PT plan of care cert/re-cert      Subjective Assessment - 01/28/15 1451    Subjective Patient reports she has had difficulty completing ADLs, holding anything of weight, and use of her RUE after a fall several weeks ago. Patient sought advice from her physician and was provided with a PT  consult and several exercises, which have marginally been helpful.    Pertinent History Patient fell several weeks ago landing in the outstretched arms position and has had increased right shoulder pain since that time. She describes a history of fibromyalgia, neck pain, a "psuedotumor" and OA.    Limitations Lifting   Diagnostic tests She does not describe any    Patient Stated Goals To be able to hold her grandchildren without pain    Currently in Pain? Yes   Pain Score 7    Pain Location Shoulder   Pain Orientation Right   Aggravating Factors  Cold air, rain, weather changes, overhead activities.    Pain Relieving Factors Leaving it alone, and not "messing with it".    Effect of Pain on Daily Activities She is right handed and has had difficulty with ADLs.            [facility] PT Assessment - 01/29/15 0001    Assessment   Medical Diagnosis Right shoulder pain    Hand Dominance Right   Precautions   Precautions None   Restrictions   Weight Bearing Restrictions No   Balance Screen   Has the patient fallen in the past 6 months Yes   How many times? 1   Has the patient had a decrease in activity level because of  a fear of falling?  Yes   Is the patient reluctant to leave their home because of a fear of falling?  No   Home Ec[employer] residence   Prior Function   Level of Independence Independent   Cognition   Overall Cognitive Status Within Functional Limits for tasks assessed   Observation/Other Assessments   Quick DASH  72.7   Sensation   Light Touch Appears Intact   Additional Comments Hyperalgesia noted in right upper trapezius and along RUE   Posture/Postural Control   Posture Comments Forward head posture, rounded shoulders   AROM   Right Shoulder Flexion --  Roughly 90 degrees limited by pain   Right Shoulder ABduction --  Roughly 70-90 degrees limited by pain   Right Shoulder Internal Rotation --  Full   Right Shoulder External  Rotation --  Limited by pain   Left Shoulder Flexion --  Full   Left Shoulder ABduction --  Full   Left Shoulder Internal Rotation --  Full   Left Shoulder External Rotation --  Full   Right Elbow Flexion --  Full   Right Elbow Extension --  Full   Left Elbow Flexion --  Full   Left Elbow Extension --  Full   PROM   Right Shoulder Flexion --  Roughly 90 degrees limited by pain   Right Shoulder ABduction --  Roughly 90 degrees then limited by pain   Right Shoulder Internal Rotation --  Full   Right Shoulder External Rotation --  Limited by pain   Strength   Right Shoulder Flexion 4-/5   Right Shoulder Extension 4-/5   Right Shoulder ABduction 4-/5   Right Shoulder Internal Rotation 3+/5   Right Shoulder External Rotation 3+/5   Left Shoulder Flexion 5/5   Left Shoulder Extension 5/5   Left Shoulder ABduction 5/5   Left Shoulder Internal Rotation 5/5   Left Shoulder External Rotation 5/5   Right Elbow Flexion 4/5   Right Elbow Extension 4/5   Left Elbow Flexion 5/5   Left Elbow Extension 5/5   Palpation   Palpation comment --  Hyperalgesia and myofascial restrictions noted in RUE    Spurling's   Findings Positive   Distraction Test   Findngs --  No change   Neer Impingement test    Findings Positive   Side Right   Hawkins-Kennedy test   Findings Positive   Side Right   Belly Press   Findings Negative   Side Right   Empty Can test   Findings Positive   Side Right   Full Can test   Findings --  Mild +   Side Right   Comment Mild pain to 90 degrees, then too painful to raise higher   Painful Arc of Motion   Findings Positive   Side Right   Comments Patient only able to flex RUE to 90 degrees      Treatments applied  Soft tissue mobilization to right upper trapezius and deltoid region, no change in symptoms  Grade I-II mobilizations provided to right scapula in upward rotation and protraction, thoracic spine P-A, cervical spine P-A. Distraction  provided to cervical spine no change in symptoms  Scapular retractions 2 sets x 10, cuing for not shrugging upper trapezius  Pec minor stretching with hand below waist x 5, increased symptoms  Seated thoracic extensions over chair x 10 with arms crossed over chest  Prone rows x 8, prone horizontal abductions with  shoulder ER x 5, both were painful and discontinued.                      PT Education - 2015-02-17 1817    Education provided Yes   Education Details HEP, potential factors contributing to her condition including scapular dysfunction, rotator cuffweakness. Referral to [facility] clinic.    Person(s) Educated Patient   Methods Explanation;Demonstration;Handout   Comprehension Verbalized understanding;Returned demonstration;Verbal cues required;Tactile cues required             PT Long Term Goals - Feb 17, 2015 1825    PT LONG TERM GOAL #1   Title Patient will be independent with a home exercise program to increase her RUE strength, AROM, and decrease pain by 04/04/2015.    Status New   PT LONG TERM GOAL #2   Title Patient will report a decrease of at least 9% on the Quick Dash to demonstrate decreased pain and increased activity in her RUE by 04/04/2015   Baseline 72.7% - Quick Dash    Status New   PT LONG TERM GOAL #3   Title Patient will demonstrate at least 120 degrees of symptom free shoulder flexion in her RUE to demonstrate increased ADL performance by 04/04/2015.    Baseline 90 degrees                Plan - Feb 17, 2015 1821    Clinical Impression Statement Patient is a 39 y/o female that experienced a fall roughly 2 months ago and has experienced right shoulder pain since that time. She is late to the exam today and was severely limited by pain in her RUE. She certainly appears to have right shoulder "impingement" symptoms, though given her complex medical history (fibromylagia, etc.) it is difficult to determine if there are any additional biomechanical  complications. Patient was provided with a litany of PT treatments, none of which seemed to increase her ROM or decrease her pain in this session. It seems that patient may have cognitive components to her presentation today , as she is rather anxious about any and all RUE movement and is unable to relax her RUE with any PROM with PT. Patient was recommended for follow up with [place] clinic for assessment of HEP   Pt will benefit from skilled therapeutic intervention in order to improve on the following deficits Decreased activity tolerance;Impaired UE functional use;Pain;Decreased strength   Rehab Potential Fair   Clinical Impairments Affecting Rehab Potential History of multiple complaints, fibromyalgia, poor response to treatments provided today.    PT Frequency 2x / week   PT Duration 6 weeks   PT Treatment/Interventions Traction;Ultrasound;Moist Heat;Taping;Dry needling;Therapeutic exercise;Therapeutic activities;Manual techniques;ADLs/Self Care Home Management;Aquatic Therapy   PT Next Visit Plan Manual techniques, distraction, progress HEP    PT Home Exercise Plan See patient instructions    Consulted and Agree with Plan of Care Patient          G-Codes - 2015-02-17 1820    Functional Assessment Tool Used QuickDASH, Clinical judgement    Functional Limitation Carrying, moving and handling objects   Carrying, Moving and Handling Objects Current Status (O3785) At least 60 percent but less than 80 percent impaired, limited or restricted   Carrying, Moving and Handling Objects Goal Status (Y8502) At least 20 percent but less than 40 percent impaired, limited or restricted       Problem List Patient Active Problem List   Diagnosis Date Noted  . Mass of breast, right 05/24/2014  .  Lump or mass in breast 05/29/2013    Kerman Passey, PT, DPT   01/29/2015, 11:30 AM  [place] [facility] [name], [place],  [address] Phone: [phone]   Fax:  [phone]

## 2015-02-20 DIAGNOSIS — N200 Calculus of kidney: Secondary | ICD-10-CM | POA: Insufficient documentation

## 2015-02-20 DIAGNOSIS — H9319 Tinnitus, unspecified ear: Secondary | ICD-10-CM | POA: Insufficient documentation

## 2015-02-20 DIAGNOSIS — R0981 Nasal congestion: Secondary | ICD-10-CM | POA: Insufficient documentation

## 2015-02-20 DIAGNOSIS — J329 Chronic sinusitis, unspecified: Secondary | ICD-10-CM | POA: Insufficient documentation

## 2015-02-20 DIAGNOSIS — R768 Other specified abnormal immunological findings in serum: Secondary | ICD-10-CM | POA: Insufficient documentation

## 2015-04-10 ENCOUNTER — Telehealth: Payer: Self-pay | Admitting: Gastroenterology

## 2015-04-10 NOTE — Telephone Encounter (Signed)
This is an established patient of Vickey Huger, she has not been seen in awhile. I have scheduled her an appointment with Dr Allen Norris on Tuesday October 4th (first available) She is still having problems with her stomach and IBS. She would like to speak with the nurse about medications she could take until she can get in for her appointment with [facility] in October. She was prescribed by Tyson Alias and Linzess and given samples of Align. Please call and advise. Thanks.

## 2015-05-14 ENCOUNTER — Ambulatory Visit: Payer: Self-pay | Admitting: Gastroenterology

## 2015-06-05 ENCOUNTER — Ambulatory Visit: Payer: Medicaid Other | Admitting: General Surgery

## 2015-06-13 ENCOUNTER — Encounter: Payer: Self-pay | Admitting: General Surgery

## 2015-06-18 ENCOUNTER — Other Ambulatory Visit: Payer: Self-pay

## 2015-06-18 ENCOUNTER — Ambulatory Visit: Payer: Self-pay | Admitting: Gastroenterology

## 2015-06-18 ENCOUNTER — Telehealth: Payer: Self-pay

## 2015-06-18 DIAGNOSIS — G43909 Migraine, unspecified, not intractable, without status migrainosus: Secondary | ICD-10-CM | POA: Insufficient documentation

## 2015-06-18 DIAGNOSIS — F329 Major depressive disorder, single episode, unspecified: Secondary | ICD-10-CM | POA: Insufficient documentation

## 2015-06-18 DIAGNOSIS — F32A Depression, unspecified: Secondary | ICD-10-CM | POA: Insufficient documentation

## 2015-06-18 NOTE — Telephone Encounter (Signed)
Patient called to cancel appointment for today as she does not have transportation.   Please call her to reschedule.

## 2015-06-24 NOTE — Telephone Encounter (Signed)
Please call and reschedule. Patient has called once again.

## 2015-06-24 NOTE — Telephone Encounter (Signed)
Pt scheduled in [place] on Nov 30th.

## 2015-07-10 ENCOUNTER — Ambulatory Visit: Payer: Self-pay | Admitting: Gastroenterology

## 2015-07-22 DIAGNOSIS — M7580 Other shoulder lesions, unspecified shoulder: Secondary | ICD-10-CM | POA: Insufficient documentation

## 2015-07-31 ENCOUNTER — Encounter: Payer: Self-pay | Admitting: *Deleted

## 2015-10-15 ENCOUNTER — Ambulatory Visit: Payer: Medicare Other | Admitting: Physical Therapy

## 2015-10-17 ENCOUNTER — Encounter: Payer: Medicare Other | Admitting: Physical Therapy

## 2015-10-22 ENCOUNTER — Encounter: Payer: Medicare Other | Admitting: Physical Therapy

## 2015-10-22 ENCOUNTER — Ambulatory Visit: Payer: Medicare Other | Admitting: Physical Therapy

## 2015-10-24 ENCOUNTER — Encounter: Payer: Medicare Other | Admitting: Physical Therapy

## 2015-10-29 ENCOUNTER — Encounter: Payer: Medicare Other | Admitting: Physical Therapy

## 2015-10-31 ENCOUNTER — Encounter: Payer: Medicare Other | Admitting: Physical Therapy

## 2015-11-05 ENCOUNTER — Encounter: Payer: Medicare Other | Admitting: Physical Therapy

## 2015-11-07 ENCOUNTER — Encounter: Payer: Medicare Other | Admitting: Physical Therapy

## 2015-12-19 ENCOUNTER — Ambulatory Visit (INDEPENDENT_AMBULATORY_CARE_PROVIDER_SITE_OTHER): Payer: Medicare Other | Admitting: Gastroenterology

## 2015-12-19 ENCOUNTER — Encounter: Payer: Self-pay | Admitting: Gastroenterology

## 2015-12-19 VITALS — Temp 97.8°F | Ht 63.0 in | Wt 274.0 lb

## 2015-12-19 DIAGNOSIS — K582 Mixed irritable bowel syndrome: Secondary | ICD-10-CM | POA: Diagnosis not present

## 2015-12-19 MED ORDER — DICYCLOMINE HCL 20 MG PO TABS: 20.0000 mg | ORAL_TABLET | Freq: Three times a day (TID) | ORAL | Status: DC

## 2015-12-19 NOTE — Progress Notes (Signed)
Primary Care Physician: Casilda Carls, MD  Primary Gastroenterologist:  Dr. Lucilla Lame  Chief Complaint  Patient presents with  . Abdominal Pain    Constipation    HPI: Angela [name] is a 40 y.o. female here for constipation. The patient reports that she has alternating diarrhea and constipation. The patient had a colonoscopy last year that did not show any cause for her symptoms. She also has some left-sided abdominal pain. She is obese and has been trying to lose weight. There is no report of any black stools or bloody stools. She has been on Linzess 145 g. She states that she takes this as needed because she has alternating diarrhea and constipation.  Current Outpatient Prescriptions  Medication Sig Dispense Refill  . acyclovir (ZOVIRAX) 400 MG tablet Take 1 tablet by mouth.    Marland Kitchen albuterol (PROVENTIL HFA;VENTOLIN HFA) 108 (90 BASE) MCG/ACT inhaler Inhale 2 puffs into the lungs every 6 (six) hours as needed for wheezing.    Marland Kitchen amitriptyline (ELAVIL) 25 MG tablet Take by mouth.    Marland Kitchen apraclonidine (IOPIDINE) 0.5 % ophthalmic solution Place 1 drop into both eyes 1 day or 1 dose.    Marland Kitchen atorvastatin (LIPITOR) 10 MG tablet Take 1 tablet by mouth 1 day or 1 dose.    . Biotin 1000 MCG tablet Take 1,000 mcg by mouth 4 (four) times a week.    . citalopram (CELEXA) 10 MG tablet Take by mouth.    . clonazePAM (KLONOPIN) 1 MG tablet Take 1 mg by mouth 3 (three) times daily.    Marland Kitchen dicyclomine (BENTYL) 10 MG capsule Take 10 mg by mouth 4 (four) times daily -  before meals and at bedtime.    . fluticasone (FLONASE) 50 MCG/ACT nasal spray 2 sprays by Each Nare route daily.    . hydrochlorothiazide (HYDRODIURIL) 25 MG tablet Take 25 mg by mouth daily.    Marland Kitchen levothyroxine (SYNTHROID, LEVOTHROID) 125 MCG tablet Take 150 mcg by mouth daily before breakfast.     . Linaclotide (LINZESS) 145 MCG CAPS capsule Take by mouth.    . nortriptyline (PAMELOR) 10 MG capsule Take by mouth.    Marland Kitchen omeprazole  (PRILOSEC) 20 MG capsule Take by mouth.    Marland Kitchen oxycodone (OXY-IR) 5 MG capsule Take 5 mg by mouth 2 (two) times daily.    . prednisoLONE acetate (PRED FORTE) 1 % ophthalmic suspension Place 1 drop into both eyes every 4 (four) hours.    . ranitidine (ZANTAC) 150 MG tablet Take by mouth.    . topiramate (TOPAMAX) 25 MG tablet Take 75 mg by mouth daily.    . Vilazodone HCl (VIIBRYD) 40 MG TABS Take by mouth.    . Vitamin D, Ergocalciferol, (DRISDOL) 50000 units CAPS capsule Take 1 capsule by mouth.    . Vortioxetine HBr (BRINTELLIX) 10 MG TABS Take by mouth daily.    Marland Kitchen dicyclomine (BENTYL) 20 MG tablet Take 1 tablet (20 mg total) by mouth 4 (four) times daily -  before meals and at bedtime. 90 tablet 2   No current facility-administered medications for this visit.    Allergies as of 12/19/2015 - Review Complete 12/19/2015  Allergen Reaction Noted  . Amoxicillin Hives 05/29/2013  . Other  05/29/2013  . Penicillins Hives 05/29/2013    ROS:  General: Negative for anorexia, weight loss, fever, chills, fatigue, weakness. ENT: Negative for hoarseness, difficulty swallowing , nasal congestion. CV: Negative for chest pain, angina, palpitations, dyspnea on exertion, peripheral edema.  Respiratory: Negative for dyspnea at rest, dyspnea on exertion, cough, sputum, wheezing.  GI: See history of present illness. GU:  Negative for dysuria, hematuria, urinary incontinence, urinary frequency, nocturnal urination.  Endo: Negative for unusual weight change.    Physical Examination:   Temp(Src) 97.8 F (36.6 C) (Oral)  Ht 5\' 3"  (1.6 m)  Wt 274 lb (124.286 kg)  BMI 48.55 kg/m2  General: Well-nourished, well-developed in no acute distress.  Eyes: No icterus. Conjunctivae pink. Mouth: Oropharyngeal mucosa moist and pink , no lesions erythema or exudate. Lungs: Clear to auscultation bilaterally. Non-labored. Heart: Regular rate and rhythm, no murmurs rubs or gallops.  Abdomen: Bowel sounds are  normal, Mild tenderness in the left upper quadrant, nondistended, no hepatosplenomegaly or masses, no abdominal bruits or hernia , no rebound or guarding.   Extremities: No lower extremity edema. No clubbing or deformities. Neuro: Alert and oriented x 3.  Grossly intact. Skin: Warm and dry, no jaundice.   Psych: Alert and cooperative, normal mood and affect.  Labs:    Imaging Studies: No results found.  Assessment and Plan:   Angela [name] is a 40 y.o. y/o female  who has irritable bowel syndrome with alternating diarrhea and constipation. The patient has been told to increase fiber in her diet. She has been told to take Citracil once a day. The patient has also been started on a low with those of Linzess 72 g to be taken as needed. The patient will contact me if her symptoms do not improve.   Note: This dictation was prepared with Dragon dictation along with smaller phrase technology. Any transcriptional errors that result from this process are unintentional.

## 2015-12-20 DIAGNOSIS — J301 Allergic rhinitis due to pollen: Secondary | ICD-10-CM | POA: Insufficient documentation

## 2015-12-20 DIAGNOSIS — G43009 Migraine without aura, not intractable, without status migrainosus: Secondary | ICD-10-CM | POA: Insufficient documentation

## 2015-12-20 DIAGNOSIS — A6000 Herpesviral infection of urogenital system, unspecified: Secondary | ICD-10-CM | POA: Insufficient documentation

## 2016-02-20 DIAGNOSIS — J45909 Unspecified asthma, uncomplicated: Secondary | ICD-10-CM | POA: Insufficient documentation

## 2016-11-16 ENCOUNTER — Other Ambulatory Visit: Payer: Self-pay

## 2016-11-16 ENCOUNTER — Telehealth: Payer: Self-pay | Admitting: Gastroenterology

## 2016-11-16 MED ORDER — DICYCLOMINE HCL 10 MG PO CAPS: 10.0000 mg | ORAL_CAPSULE | Freq: Three times a day (TID) | ORAL | 3 refills | Status: DC

## 2016-11-16 MED ORDER — LINACLOTIDE 145 MCG PO CAPS: 145.0000 ug | ORAL_CAPSULE | Freq: Every day | ORAL | 3 refills | Status: DC

## 2016-11-16 NOTE — Telephone Encounter (Signed)
Rx refills for Bentyl and Linzess has been sent to [facility] [place] per pt request.

## 2016-11-16 NOTE — Telephone Encounter (Signed)
*  STAT* If patient is at the pharmacy, call can be transferred to refill team.   1. Which medications need to be refilled? (please list name of each medication and dose if known) dicyclomine (BENTYL) 10 MG capsule, and linzess.   2. Which pharmacy/location (including street and city if local pharmacy) is medication to be sent to? [facility] [place]  3. Do they need a 30 day or 90 day supply? 90 day

## 2016-11-24 ENCOUNTER — Encounter: Payer: Self-pay | Admitting: *Deleted

## 2016-11-26 ENCOUNTER — Inpatient Hospital Stay: Payer: Self-pay

## 2016-11-26 ENCOUNTER — Ambulatory Visit (INDEPENDENT_AMBULATORY_CARE_PROVIDER_SITE_OTHER): Payer: Medicare Other | Admitting: General Surgery

## 2016-11-26 ENCOUNTER — Encounter: Payer: Self-pay | Admitting: General Surgery

## 2016-11-26 VITALS — BP 130/74 | HR 84 | Resp 14 | Ht 63.0 in | Wt 275.0 lb

## 2016-11-26 DIAGNOSIS — N6310 Unspecified lump in the right breast, unspecified quadrant: Secondary | ICD-10-CM

## 2016-11-26 DIAGNOSIS — N6452 Nipple discharge: Secondary | ICD-10-CM | POA: Diagnosis not present

## 2016-11-26 DIAGNOSIS — N6314 Unspecified lump in the right breast, lower inner quadrant: Secondary | ICD-10-CM | POA: Diagnosis not present

## 2016-11-26 NOTE — Progress Notes (Addendum)
Patient ID: Angela [name], female   DOB: [birth year], 41 y.o.   MRN: [id number]  Chief Complaint  Patient presents with  . Follow-up    mammogram    HPI Angela [name] is a 41 y.o. female who presents for a breast evaluation. The most recent mammogram was done on 11/23/2016. She states she has been having a lot more tenderness in both breast.Some occasional "tingling" around her nipples.Patient was seen in 2015 for a right breast biopsy and in 2014 she had a FNA. Patient does perform regular self breast checks and gets regular mammograms done.    HPI  Past Medical History:  Diagnosis Date  . Anemia   . Anxiety   . Depression   . Fibromyalgia   . GERD (gastroesophageal reflux disease)   . Hypertension   . Irritable bowel syndrome (IBS)   . Pseudotumor cerebri   . Thyroid disease    hypothyroid  . Vision disturbance     Past Surgical History:  Procedure Laterality Date  . ABDOMINAL HYSTERECTOMY  02/06/13  . BREAST BIOPSY Right October, 2014   right breast core biopsy, fibroadenomatous changes  . BREAST BIOPSY Right December 2014   FNA retroareolar nodule consistent with fibroadenoma.  . CESAREAN SECTION  1993, 1996, 1997  . COLONOSCOPY  2015   Dr Allen Norris  . RHINOPLASTY    . TONSILECTOMY, ADENOIDECTOMY, BILATERAL MYRINGOTOMY AND TUBES  2004  . UPPER GASTROINTESTINAL ENDOSCOPY  2015   Dr Allen Norris    Family History  Problem Relation Age of Onset  . Depression Mother   . Migraines Mother   . Diverticulitis Mother   . Hypertension Mother   . Heart disease Father   . Diabetes Father   . Hypertension Father     Social History Social History  Substance Use Topics  . Smoking status: Never Smoker  . Smokeless tobacco: Never Used  . Alcohol use Yes    Allergies  Allergen Reactions  . Amoxicillin Hives  . Lisinopril Cough  . Other     Sulfa eye drops- Scratched corneas  . Penicillins Hives    Current Outpatient Prescriptions  Medication Sig Dispense  Refill  . acyclovir (ZOVIRAX) 400 MG tablet Take 1 tablet by mouth.    Marland Kitchen albuterol (PROVENTIL HFA;VENTOLIN HFA) 108 (90 BASE) MCG/ACT inhaler Inhale 2 puffs into the lungs every 6 (six) hours as needed for wheezing.    Marland Kitchen amitriptyline (ELAVIL) 25 MG tablet Take by mouth.    Marland Kitchen atorvastatin (LIPITOR) 10 MG tablet Take 1 tablet by mouth 1 day or 1 dose.    . Biotin 1000 MCG tablet Take 1,000 mcg by mouth 4 (four) times a week.    . citalopram (CELEXA) 10 MG tablet Take by mouth.    . clonazePAM (KLONOPIN) 1 MG tablet Take 1 mg by mouth 3 (three) times daily.    Marland Kitchen dicyclomine (BENTYL) 10 MG capsule Take 1 capsule (10 mg total) by mouth 3 (three) times daily before meals. 270 capsule 3  . dicyclomine (BENTYL) 20 MG tablet Take 1 tablet (20 mg total) by mouth 4 (four) times daily -  before meals and at bedtime. 90 tablet 2  . hydrochlorothiazide (HYDRODIURIL) 25 MG tablet Take 25 mg by mouth daily.    Marland Kitchen levothyroxine (SYNTHROID, LEVOTHROID) 125 MCG tablet Take 150 mcg by mouth daily before breakfast.     . linaclotide (LINZESS) 145 MCG CAPS capsule Take 1 capsule (145 mcg total) by mouth daily before breakfast. 90 capsule  3  . omeprazole (PRILOSEC) 20 MG capsule Take by mouth.    Marland Kitchen oxycodone (OXY-IR) 5 MG capsule Take 5 mg by mouth 2 (two) times daily.    . prednisoLONE acetate (PRED FORTE) 1 % ophthalmic suspension Place 1 drop into both eyes every 4 (four) hours.    . topiramate (TOPAMAX) 25 MG tablet Take 75 mg by mouth daily.    . Vitamin D, Ergocalciferol, (DRISDOL) 50000 units CAPS capsule Take 1 capsule by mouth.    . Vortioxetine HBr (BRINTELLIX) 10 MG TABS Take by mouth daily.    . fluticasone (FLONASE) 50 MCG/ACT nasal spray 2 sprays by Each Nare route daily.     No current facility-administered medications for this visit.     Review of Systems Review of Systems  Blood pressure 130/74, pulse 84, resp. rate 14, height 5\' 3"  (1.6 m), weight 275 lb (124.7 kg).  Physical Exam Physical  Exam  Constitutional: She is oriented to person, place, and time. She appears well-developed and well-nourished.  Eyes: Conjunctivae are normal. No scleral icterus.  Neck: Neck supple.  Cardiovascular: Normal rate, regular rhythm and normal heart sounds.   Pulmonary/Chest: Effort normal and breath sounds normal. Right breast exhibits mass. Right breast exhibits no inverted nipple, no nipple discharge, no skin change and no tenderness. Left breast exhibits no inverted nipple, no mass, no nipple discharge, no skin change and no tenderness.    Clear drainage is notable from multiple ducts in both the right and left breast. This is with vigorous massage.  No dominant breast nodules or mass effect noted except that in the right retroareolar area.  Lymphadenopathy:    She has no cervical adenopathy.    She has no axillary adenopathy.  Neurological: She is alert and oriented to person, place, and time.  Skin: Skin is warm and dry.    Data Reviewed 05/24/2014 ultrasound of the right breast showed a bilobed nodule measuring 0.8 x 0.9 x 0.9 and 0.4 x 0.5 x 0.5 cm.Marland Kitchen  Ultrasound today at the 9:00 position, 1 cm from the nipple shows a single dominant nodule measuring 0.9 x 1.0 x 1.2 cm. An adjacent 3 mm anechoic lesion is also noted. No posterior acoustic enhancement. No increased vascular flow. diameter with an adjacent small cystic lesion measuring up to 0.3 cm.  Moderate increased from last exam. BI-RADS-3.  Assessment    Slowly enlarging right breast nodule, unlikely related to present complaints of bilateral nipple tenderness.  Multiduct, clear nipple drainage, likely physiologic.    Plan    With the slightly enlarging area and mild focal discomfort of the right retroareolar area nodule, excision has been recommended. Due to its superficial location this would be best completed by a simple excision rather than vacuum biopsy. This will not likely change her report of intermittent nipple  tenderness. Risks of the procedure were reviewed.    Patient to return for right breast excision.   HPI, Physical Exam, Assessment and Plan have been scribed under the direction and in the presence of Hervey Ard, MD.  Gaspar Cola, CMA  I have completed the exam and reviewed the above documentation for accuracy and completeness.  I agree with the above.  Haematologist has been used and any errors in dictation or transcription are unintentional.  Hervey Ard, M.D., F.A.C.S.  Robert Bellow 11/27/2016, 8:13 PM

## 2016-11-26 NOTE — Patient Instructions (Signed)
Patient to return for right breast excision .  

## 2016-11-27 DIAGNOSIS — N6452 Nipple discharge: Secondary | ICD-10-CM | POA: Insufficient documentation

## 2016-12-10 ENCOUNTER — Ambulatory Visit: Payer: Medicare Other | Admitting: General Surgery

## 2016-12-16 ENCOUNTER — Ambulatory Visit (INDEPENDENT_AMBULATORY_CARE_PROVIDER_SITE_OTHER): Payer: Medicare Other | Admitting: General Surgery

## 2016-12-16 ENCOUNTER — Encounter: Payer: Self-pay | Admitting: General Surgery

## 2016-12-16 VITALS — BP 112/68 | HR 86 | Resp 12 | Ht 63.0 in | Wt 274.0 lb

## 2016-12-16 DIAGNOSIS — N6310 Unspecified lump in the right breast, unspecified quadrant: Secondary | ICD-10-CM

## 2016-12-16 DIAGNOSIS — N6341 Unspecified lump in right breast, subareolar: Secondary | ICD-10-CM

## 2016-12-16 HISTORY — PX: BREAST BIOPSY: SHX20

## 2016-12-16 MED ORDER — TRAMADOL HCL 50 MG PO TABS: 50.0000 mg | ORAL_TABLET | Freq: Four times a day (QID) | ORAL | 0 refills | Status: DC | PRN

## 2016-12-16 NOTE — Progress Notes (Signed)
Patient ID: Angela [name], female   DOB: 1975/11/27, 41 y.o.   MRN: [id number]  Chief Complaint  Patient presents with  . Procedure    HPI Angela [name] is a 41 y.o. female.  Here today for excision right breast mass.The procedure had been reviewed in detail.  HPI  Past Medical History:  Diagnosis Date  . Anemia   . Anxiety   . Depression   . Fibromyalgia   . GERD (gastroesophageal reflux disease)   . Hypertension   . Irritable bowel syndrome (IBS)   . Pseudotumor cerebri   . Thyroid disease    hypothyroid  . Vision disturbance     Past Surgical History:  Procedure Laterality Date  . ABDOMINAL HYSTERECTOMY  02/06/13  . BREAST BIOPSY Right October, 2014   right breast core biopsy, fibroadenomatous changes  . BREAST BIOPSY Right December 2014   FNA retroareolar nodule consistent with fibroadenoma.  . CESAREAN SECTION  1993, 1996, 1997  . COLONOSCOPY  2015   Dr Allen Norris  . RHINOPLASTY    . TONSILECTOMY, ADENOIDECTOMY, BILATERAL MYRINGOTOMY AND TUBES  2004  . UPPER GASTROINTESTINAL ENDOSCOPY  2015   Dr Allen Norris    Family History  Problem Relation Age of Onset  . Depression Mother   . Migraines Mother   . Diverticulitis Mother   . Hypertension Mother   . Heart disease Father   . Diabetes Father   . Hypertension Father     Social History Social History  Substance Use Topics  . Smoking status: Never Smoker  . Smokeless tobacco: Never Used  . Alcohol use Yes    Allergies  Allergen Reactions  . Amoxicillin Hives  . Lisinopril Cough  . Other     Sulfa eye drops- Scratched corneas  . Penicillins Hives    Current Outpatient Prescriptions  Medication Sig Dispense Refill  . acyclovir (ZOVIRAX) 400 MG tablet Take 1 tablet by mouth.    Marland Kitchen albuterol (PROVENTIL HFA;VENTOLIN HFA) 108 (90 BASE) MCG/ACT inhaler Inhale 2 puffs into the lungs every 6 (six) hours as needed for wheezing.    Marland Kitchen atorvastatin (LIPITOR) 10 MG tablet Take 1 tablet by mouth 1 day  or 1 dose.    . Biotin 1000 MCG tablet Take 1,000 mcg by mouth 4 (four) times a week.    . citalopram (CELEXA) 10 MG tablet Take by mouth.    . clonazePAM (KLONOPIN) 1 MG tablet Take 1 mg by mouth 3 (three) times daily.    Marland Kitchen dicyclomine (BENTYL) 10 MG capsule Take 1 capsule (10 mg total) by mouth 3 (three) times daily before meals. 270 capsule 3  . dicyclomine (BENTYL) 20 MG tablet Take 1 tablet (20 mg total) by mouth 4 (four) times daily -  before meals and at bedtime. 90 tablet 2  . hydrochlorothiazide (HYDRODIURIL) 25 MG tablet Take 25 mg by mouth daily.    Marland Kitchen levothyroxine (SYNTHROID, LEVOTHROID) 125 MCG tablet Take 150 mcg by mouth daily before breakfast.     . linaclotide (LINZESS) 145 MCG CAPS capsule Take 1 capsule (145 mcg total) by mouth daily before breakfast. 90 capsule 3  . omeprazole (PRILOSEC) 20 MG capsule Take by mouth.    . prednisoLONE acetate (PRED FORTE) 1 % ophthalmic suspension Place 1 drop into both eyes every 4 (four) hours.    . topiramate (TOPAMAX) 25 MG tablet Take 75 mg by mouth daily.    . Vitamin D, Ergocalciferol, (DRISDOL) 50000 units CAPS capsule Take 1 capsule by  mouth.    . Vortioxetine HBr (BRINTELLIX) 10 MG TABS Take by mouth daily.    . fluticasone (FLONASE) 50 MCG/ACT nasal spray 2 sprays by Each Nare route daily.    . traMADol (ULTRAM) 50 MG tablet Take 1 tablet (50 mg total) by mouth every 6 (six) hours as needed. 10 tablet 0   No current facility-administered medications for this visit.     Review of Systems Review of Systems  Constitutional: Negative.   Respiratory: Negative.   Cardiovascular: Negative.     Blood pressure 112/68, pulse 86, resp. rate 12, height 5\' 3"  (1.6 m), weight 274 lb (124.3 kg).  Physical Exam Physical Exam  Constitutional: She is oriented to person, place, and time. She appears well-developed and well-nourished.  Pulmonary/Chest:    Neurological: She is alert and oriented to person, place, and time.  Skin: Skin is  warm and dry.  Psychiatric: Her behavior is normal.    Data Reviewed The area was cleansed with alcohol and after marking to preclude loss in the adjacent tissue, 10 mL of 0.5% Xylocaine with 0.25% Marcaine with 1-200,000 of epinephrine was instilled and well tolerated. After suitable waiting. ChloraPrep was applied to the skin. A radial incision was made over the palpable mass. This was made sharply and hemostasis achieved with 3-0 Vicryl ties. The mass was excised sharply and orientated with a short suture on the superior edge and a long suture on the medial edge. It was sent in formalin for routine histology. The consistency was that of a fibroadenoma. The deep tissue was approximated with interrupted 3-0 Vicryl sutures. The skin closed with a running 4-0 Vicryl septic liver suture. Benzoin, Steri-Strips followed by Telfa and Tegaderm dressing. Ice pack provided. Postoperative wound care instructions provided in writing and reviewed with the patient by the nurse.  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    Plan    The patient will report if she has any concerns regarding wound healing.    RX for tramadol 50 mg as needed #10  HPI, Physical Exam, Assessment and Plan have been scribed under the direction and in the presence of Robert Bellow, MD.  Angela Fetch, RN  Robert Bellow 12/16/2016, 9:37 PM

## 2016-12-16 NOTE — Patient Instructions (Signed)

## 2016-12-19 ENCOUNTER — Telehealth: Payer: Self-pay | Admitting: General Surgery

## 2016-12-19 NOTE — Telephone Encounter (Signed)
Left message that all was well. To call if questions. My chart message sent as well.

## 2018-01-25 DIAGNOSIS — Z9079 Acquired absence of other genital organ(s): Secondary | ICD-10-CM | POA: Insufficient documentation

## 2018-01-25 DIAGNOSIS — E221 Hyperprolactinemia: Secondary | ICD-10-CM | POA: Insufficient documentation

## 2018-01-25 DIAGNOSIS — I7300 Raynaud's syndrome without gangrene: Secondary | ICD-10-CM | POA: Insufficient documentation

## 2018-01-25 DIAGNOSIS — F4310 Post-traumatic stress disorder, unspecified: Secondary | ICD-10-CM | POA: Insufficient documentation

## 2018-01-25 DIAGNOSIS — E785 Hyperlipidemia, unspecified: Secondary | ICD-10-CM | POA: Insufficient documentation

## 2018-01-25 DIAGNOSIS — E039 Hypothyroidism, unspecified: Secondary | ICD-10-CM | POA: Insufficient documentation

## 2018-01-25 DIAGNOSIS — K589 Irritable bowel syndrome without diarrhea: Secondary | ICD-10-CM | POA: Insufficient documentation

## 2018-01-25 DIAGNOSIS — F32A Depression, unspecified: Secondary | ICD-10-CM | POA: Insufficient documentation

## 2018-01-25 DIAGNOSIS — R5383 Other fatigue: Secondary | ICD-10-CM | POA: Insufficient documentation

## 2018-01-25 DIAGNOSIS — Z9089 Acquired absence of other organs: Secondary | ICD-10-CM | POA: Insufficient documentation

## 2018-05-31 DIAGNOSIS — O926 Galactorrhea: Secondary | ICD-10-CM | POA: Insufficient documentation

## 2018-12-20 ENCOUNTER — Emergency Department: Payer: Medicare Other

## 2018-12-20 ENCOUNTER — Other Ambulatory Visit: Payer: Self-pay

## 2018-12-20 ENCOUNTER — Inpatient Hospital Stay
Admission: EM | Admit: 2018-12-20 | Discharge: 2018-12-23 | DRG: 176 | Disposition: A | Discharge status: Home / Self Care | Payer: Medicare Other | Attending: Internal Medicine | Admitting: Internal Medicine

## 2018-12-20 DIAGNOSIS — M797 Fibromyalgia: Secondary | ICD-10-CM | POA: Diagnosis present

## 2018-12-20 DIAGNOSIS — Z90710 Acquired absence of both cervix and uterus: Secondary | ICD-10-CM

## 2018-12-20 DIAGNOSIS — F329 Major depressive disorder, single episode, unspecified: Secondary | ICD-10-CM | POA: Diagnosis present

## 2018-12-20 DIAGNOSIS — I2699 Other pulmonary embolism without acute cor pulmonale: Principal | ICD-10-CM | POA: Diagnosis present

## 2018-12-20 DIAGNOSIS — Z880 Allergy status to penicillin: Secondary | ICD-10-CM

## 2018-12-20 DIAGNOSIS — Z23 Encounter for immunization: Secondary | ICD-10-CM | POA: Diagnosis present

## 2018-12-20 DIAGNOSIS — G894 Chronic pain syndrome: Secondary | ICD-10-CM | POA: Diagnosis present

## 2018-12-20 DIAGNOSIS — Z86711 Personal history of pulmonary embolism: Secondary | ICD-10-CM | POA: Diagnosis not present

## 2018-12-20 DIAGNOSIS — E785 Hyperlipidemia, unspecified: Secondary | ICD-10-CM | POA: Diagnosis present

## 2018-12-20 DIAGNOSIS — F419 Anxiety disorder, unspecified: Secondary | ICD-10-CM | POA: Diagnosis present

## 2018-12-20 DIAGNOSIS — E039 Hypothyroidism, unspecified: Secondary | ICD-10-CM | POA: Diagnosis present

## 2018-12-20 DIAGNOSIS — I10 Essential (primary) hypertension: Secondary | ICD-10-CM | POA: Diagnosis present

## 2018-12-20 DIAGNOSIS — K589 Irritable bowel syndrome without diarrhea: Secondary | ICD-10-CM | POA: Diagnosis present

## 2018-12-20 DIAGNOSIS — Z818 Family history of other mental and behavioral disorders: Secondary | ICD-10-CM

## 2018-12-20 DIAGNOSIS — Z86718 Personal history of other venous thrombosis and embolism: Secondary | ICD-10-CM | POA: Diagnosis not present

## 2018-12-20 DIAGNOSIS — Z882 Allergy status to sulfonamides status: Secondary | ICD-10-CM

## 2018-12-20 DIAGNOSIS — Z20828 Contact with and (suspected) exposure to other viral communicable diseases: Secondary | ICD-10-CM | POA: Diagnosis present

## 2018-12-20 DIAGNOSIS — R1011 Right upper quadrant pain: Secondary | ICD-10-CM

## 2018-12-20 DIAGNOSIS — Z6841 Body Mass Index (BMI) 40.0 and over, adult: Secondary | ICD-10-CM | POA: Diagnosis not present

## 2018-12-20 DIAGNOSIS — Z79890 Hormone replacement therapy (postmenopausal): Secondary | ICD-10-CM | POA: Diagnosis not present

## 2018-12-20 DIAGNOSIS — K219 Gastro-esophageal reflux disease without esophagitis: Secondary | ICD-10-CM | POA: Diagnosis present

## 2018-12-20 DIAGNOSIS — J9811 Atelectasis: Secondary | ICD-10-CM | POA: Diagnosis present

## 2018-12-20 DIAGNOSIS — J90 Pleural effusion, not elsewhere classified: Secondary | ICD-10-CM | POA: Diagnosis present

## 2018-12-20 DIAGNOSIS — J45909 Unspecified asthma, uncomplicated: Secondary | ICD-10-CM | POA: Diagnosis present

## 2018-12-20 DIAGNOSIS — G932 Benign intracranial hypertension: Secondary | ICD-10-CM | POA: Diagnosis present

## 2018-12-20 DIAGNOSIS — G43909 Migraine, unspecified, not intractable, without status migrainosus: Secondary | ICD-10-CM | POA: Diagnosis present

## 2018-12-20 DIAGNOSIS — Z79891 Long term (current) use of opiate analgesic: Secondary | ICD-10-CM

## 2018-12-20 DIAGNOSIS — Z8249 Family history of ischemic heart disease and other diseases of the circulatory system: Secondary | ICD-10-CM

## 2018-12-20 DIAGNOSIS — Z833 Family history of diabetes mellitus: Secondary | ICD-10-CM

## 2018-12-20 DIAGNOSIS — Z888 Allergy status to other drugs, medicaments and biological substances status: Secondary | ICD-10-CM

## 2018-12-20 DIAGNOSIS — I959 Hypotension, unspecified: Secondary | ICD-10-CM | POA: Diagnosis present

## 2018-12-20 DIAGNOSIS — Z79899 Other long term (current) drug therapy: Secondary | ICD-10-CM

## 2018-12-20 LAB — CBC WITH DIFFERENTIAL/PLATELET
Abs Immature Granulocytes: 0.04 10*3/uL (ref 0.00–0.07)
Basophils Absolute: 0.1 10*3/uL (ref 0.0–0.1)
Basophils Relative: 1 %
Eosinophils Absolute: 0.2 10*3/uL (ref 0.0–0.5)
Eosinophils Relative: 1 %
HCT: 37.9 % (ref 36.0–46.0)
Hemoglobin: 12.4 g/dL (ref 12.0–15.0)
Immature Granulocytes: 0 %
Lymphocytes Relative: 25 %
Lymphs Abs: 2.7 10*3/uL (ref 0.7–4.0)
MCH: 28.2 pg (ref 26.0–34.0)
MCHC: 32.7 g/dL (ref 30.0–36.0)
MCV: 86.1 fL (ref 80.0–100.0)
Monocytes Absolute: 0.7 10*3/uL (ref 0.1–1.0)
Monocytes Relative: 7 %
Neutro Abs: 7.2 10*3/uL (ref 1.7–7.7)
Neutrophils Relative %: 66 %
Platelets: 397 10*3/uL (ref 150–400)
RBC: 4.4 MIL/uL (ref 3.87–5.11)
RDW: 13.2 % (ref 11.5–15.5)
WBC: 10.9 10*3/uL — ABNORMAL HIGH (ref 4.0–10.5)
nRBC: 0 % (ref 0.0–0.2)

## 2018-12-20 LAB — PROTIME-INR
INR: 1.1 (ref 0.8–1.2)
Prothrombin Time: 14.1 seconds (ref 11.4–15.2)

## 2018-12-20 LAB — COMPREHENSIVE METABOLIC PANEL
ALT: 9 U/L (ref 0–44)
AST: 11 U/L — ABNORMAL LOW (ref 15–41)
Albumin: 3.5 g/dL (ref 3.5–5.0)
Alkaline Phosphatase: 53 U/L (ref 38–126)
Anion gap: 5 (ref 5–15)
BUN: 11 mg/dL (ref 6–20)
CO2: 21 mmol/L — ABNORMAL LOW (ref 22–32)
Calcium: 8.5 mg/dL — ABNORMAL LOW (ref 8.9–10.3)
Chloride: 114 mmol/L — ABNORMAL HIGH (ref 98–111)
Creatinine, Ser: 0.94 mg/dL (ref 0.44–1.00)
GFR calc Af Amer: >60 mL/min (ref >60)
GFR calc non Af Amer: >60 mL/min (ref >60)
Glucose, Bld: 89 mg/dL (ref 70–99)
Potassium: 3.9 mmol/L (ref 3.5–5.1)
Sodium: 140 mmol/L (ref 135–145)
Total Bilirubin: 0.3 mg/dL (ref 0.3–1.2)
Total Protein: 6.9 g/dL (ref 6.5–8.1)

## 2018-12-20 LAB — POCT PREGNANCY, URINE: Preg Test, Ur: NEGATIVE

## 2018-12-20 LAB — APTT: aPTT: 40 seconds — ABNORMAL HIGH (ref 24–36)

## 2018-12-20 LAB — FIBRIN DERIVATIVES D-DIMER (ARMC ONLY): Fibrin derivatives D-dimer (ARMC): 1706.47 ng/mL (FEU) — ABNORMAL HIGH (ref 0.00–499.00)

## 2018-12-20 LAB — LIPASE, BLOOD: Lipase: 31 U/L (ref 11–51)

## 2018-12-20 MED ORDER — SODIUM CHLORIDE 0.9 % IV SOLN
INTRAVENOUS | Status: DC
  Administered 2018-12-21 – 2018-12-23 (×6): via INTRAVENOUS

## 2018-12-20 MED ORDER — HEPARIN BOLUS VIA INFUSION
5000.0000 [IU] | Freq: Once | INTRAVENOUS | Status: AC
  Administered 2018-12-20: 23:00:00 5000 [IU] via INTRAVENOUS
  Filled 2018-12-20: qty 5000

## 2018-12-20 MED ORDER — BIOTIN 1000 MCG PO TABS: 1000.0000 ug | ORAL_TABLET | ORAL | Status: DC

## 2018-12-20 MED ORDER — ONDANSETRON HCL 4 MG/2ML IJ SOLN
4.0000 mg | Freq: Four times a day (QID) | INTRAMUSCULAR | Status: DC | PRN
  Administered 2018-12-21: 09:00:00 4 mg via INTRAVENOUS
  Filled 2018-12-20: qty 2

## 2018-12-20 MED ORDER — ACETAMINOPHEN 325 MG PO TABS
650.0000 mg | ORAL_TABLET | Freq: Four times a day (QID) | ORAL | Status: DC | PRN
  Administered 2018-12-21 – 2018-12-23 (×2): 650 mg via ORAL
  Filled 2018-12-20 (×2): qty 2

## 2018-12-20 MED ORDER — HEPARIN (PORCINE) 25000 UT/250ML-% IV SOLN
1400.0000 [IU]/h | INTRAVENOUS | Status: DC
  Administered 2018-12-20 – 2018-12-21 (×2): 1500 [IU]/h via INTRAVENOUS
  Administered 2018-12-23: 1400 [IU]/h via INTRAVENOUS
  Filled 2018-12-20 (×5): qty 250

## 2018-12-20 MED ORDER — HEPARIN SODIUM (PORCINE) 5000 UNIT/ML IJ SOLN: 4000.0000 [IU] | Freq: Once | INTRAMUSCULAR | Status: DC

## 2018-12-20 MED ORDER — LEVOTHYROXINE SODIUM 150 MCG PO TABS
150.0000 ug | ORAL_TABLET | Freq: Every day | ORAL | Status: DC
  Administered 2018-12-21 – 2018-12-23 (×3): 150 ug via ORAL
  Filled 2018-12-20 (×2): qty 1
  Filled 2018-12-20: qty 3
  Filled 2018-12-20: qty 1
  Filled 2018-12-20 (×2): qty 3

## 2018-12-20 MED ORDER — WARFARIN SODIUM 5 MG PO TABS
5.0000 mg | ORAL_TABLET | Freq: Once | ORAL | Status: AC
  Administered 2018-12-21: 03:00:00 5 mg via ORAL
  Filled 2018-12-20: qty 1

## 2018-12-20 MED ORDER — HEPARIN (PORCINE) 25000 UT/250ML-% IV SOLN: 10.0000 [IU]/kg/h | INTRAVENOUS | Status: DC

## 2018-12-20 MED ORDER — WARFARIN SODIUM 5 MG PO TABS
5.0000 mg | ORAL_TABLET | Freq: Once | ORAL | Status: AC
  Administered 2018-12-21: 18:00:00 5 mg via ORAL
  Filled 2018-12-20: qty 1

## 2018-12-20 MED ORDER — ACETAMINOPHEN 650 MG RE SUPP: 650.0000 mg | Freq: Four times a day (QID) | RECTAL | Status: DC | PRN

## 2018-12-20 MED ORDER — ALBUTEROL SULFATE (2.5 MG/3ML) 0.083% IN NEBU: 3.0000 mL | INHALATION_SOLUTION | Freq: Four times a day (QID) | RESPIRATORY_TRACT | Status: DC | PRN

## 2018-12-20 MED ORDER — CITALOPRAM HYDROBROMIDE 10 MG PO TABS
10.0000 mg | ORAL_TABLET | Freq: Every day | ORAL | Status: DC
  Filled 2018-12-20: qty 1

## 2018-12-20 MED ORDER — ONDANSETRON HCL 4 MG/2ML IJ SOLN
4.0000 mg | Freq: Once | INTRAMUSCULAR | Status: AC
  Administered 2018-12-20: 20:00:00 4 mg via INTRAVENOUS
  Filled 2018-12-20: qty 2

## 2018-12-20 MED ORDER — DICYCLOMINE HCL 20 MG PO TABS
20.0000 mg | ORAL_TABLET | Freq: Three times a day (TID) | ORAL | Status: DC
  Filled 2018-12-20 (×2): qty 1

## 2018-12-20 MED ORDER — ATORVASTATIN CALCIUM 10 MG PO TABS: 10.0000 mg | ORAL_TABLET | ORAL | Status: DC

## 2018-12-20 MED ORDER — TOPIRAMATE 25 MG PO TABS
75.0000 mg | ORAL_TABLET | Freq: Every day | ORAL | Status: DC
  Filled 2018-12-20: qty 3

## 2018-12-20 MED ORDER — TRAZODONE HCL 50 MG PO TABS
25.0000 mg | ORAL_TABLET | Freq: Every evening | ORAL | Status: DC | PRN
  Administered 2018-12-21: 21:00:00 25 mg via ORAL
  Filled 2018-12-20: qty 1

## 2018-12-20 MED ORDER — MORPHINE SULFATE (PF) 4 MG/ML IV SOLN
4.0000 mg | Freq: Once | INTRAVENOUS | Status: AC
  Administered 2018-12-20: 4 mg via INTRAVENOUS
  Filled 2018-12-20: qty 1

## 2018-12-20 MED ORDER — PREDNISOLONE ACETATE 1 % OP SUSP: 1.0000 [drp] | OPHTHALMIC | Status: DC

## 2018-12-20 MED ORDER — HYDROCHLOROTHIAZIDE 25 MG PO TABS: 25.0000 mg | ORAL_TABLET | Freq: Every day | ORAL | Status: DC

## 2018-12-20 MED ORDER — IOHEXOL 350 MG/ML SOLN
75.0000 mL | Freq: Once | INTRAVENOUS | Status: AC | PRN
  Administered 2018-12-20: 22:00:00 75 mL via INTRAVENOUS
  Filled 2018-12-20: qty 75

## 2018-12-20 MED ORDER — MAGNESIUM HYDROXIDE 400 MG/5ML PO SUSP
30.0000 mL | Freq: Every day | ORAL | Status: DC | PRN
  Administered 2018-12-21: 30 mL via ORAL
  Filled 2018-12-20: qty 30

## 2018-12-20 MED ORDER — VORTIOXETINE HBR 5 MG PO TABS
10.0000 mg | ORAL_TABLET | Freq: Every day | ORAL | Status: DC
  Filled 2018-12-20: qty 2

## 2018-12-20 MED ORDER — LINACLOTIDE 145 MCG PO CAPS
145.0000 ug | ORAL_CAPSULE | Freq: Every day | ORAL | Status: DC
  Filled 2018-12-20: qty 1

## 2018-12-20 MED ORDER — VITAMIN D (ERGOCALCIFEROL) 1.25 MG (50000 UNIT) PO CAPS: 50000.0000 [IU] | ORAL_CAPSULE | ORAL | Status: DC

## 2018-12-20 MED ORDER — PANTOPRAZOLE SODIUM 40 MG PO TBEC
40.0000 mg | DELAYED_RELEASE_TABLET | Freq: Every day | ORAL | Status: DC
  Administered 2018-12-21 – 2018-12-23 (×3): 40 mg via ORAL
  Filled 2018-12-20 (×3): qty 1

## 2018-12-20 MED ORDER — CLONAZEPAM 1 MG PO TABS: 1.0000 mg | ORAL_TABLET | Freq: Three times a day (TID) | ORAL | Status: DC

## 2018-12-20 MED ORDER — ONDANSETRON HCL 4 MG PO TABS: 4.0000 mg | ORAL_TABLET | Freq: Four times a day (QID) | ORAL | Status: DC | PRN

## 2018-12-20 NOTE — Consult Note (Signed)
ANTICOAGULATION CONSULT NOTE - Initial Consult  Pharmacy Consult for Heparin Drip dosing and monitoring  Indication: pulmonary embolus/Pulmonary infarct   Allergies  Allergen Reactions  . Amoxicillin Hives  . Lisinopril Cough  . Nortriptyline   . Other     Sulfa eye drops- Scratched corneas  . Penicillins Hives  . Sulfa Antibiotics     Patient Measurements: Height: 5\' 3"  (160 cm) Weight: 288 lb (130.6 kg) IBW/kg (Calculated) : 52.4 Heparin Dosing Weight: 85kg  Vital Signs: Temp: 98.4 F (36.9 C) (05/12 1847) Temp Source: Oral (05/12 1846) BP: 116/62 (05/12 2112) Pulse Rate: 62 (05/12 2112)  Labs: Recent Labs    12/20/18 1859  HGB 12.4  HCT 37.9  PLT 397  CREATININE 0.94    Estimated Creatinine Clearance: 102 mL/min (by C-G formula based on SCr of 0.94 mg/dL).   Medical History: Past Medical History:  Diagnosis Date  . Anemia   . Anxiety   . Depression   . Fibromyalgia   . GERD (gastroesophageal reflux disease)   . Hypertension   . Irritable bowel syndrome (IBS)   . Pseudotumor cerebri   . Thyroid disease    hypothyroid  . Vision disturbance     Assessment: Pharmacy consulted for heparin drip dosing and monitoring for 43 yo female admitted with pulmonary embolus/Pulmonary infarct.   Goal of Therapy:  Heparin level 0.3-0.7 units/ml Monitor platelets by anticoagulation protocol: Yes   Plan:  Baseline labs ordered Dosing weight: 85 kg Give 5000 units bolus x 1 Start heparin infusion at 1500 units/hr Check anti-Xa level in 6 hours and daily while on heparin Continue to monitor H&H and platelets  Pernell Dupre, PharmD, BCPS Clinical Pharmacist 12/20/2018 11:58 PM

## 2018-12-20 NOTE — ED Provider Notes (Addendum)
[facility] Emergency Department Provider Note   ____________________________________________   First MD Initiated Contact with Patient 12/20/18 1906     (approximate)  I have reviewed the triage vital signs and the nursing notes.   HISTORY  Chief Complaint Shortness of Breath  HPI Angela [name] is a 43 y.o. female who reports right upper quadrant pain.  She says it started across the whole abdomen but the left side got better and the worse pain is moved to the right.  It is worse with deep breathing.  Is worse with palpation.  It makes her short of breath.  Patient has no known injury.  Pain is actually right under the ribs.  Is moderately severe, sharp.        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/09/2014   Mass of breast, right 05/24/2014   Lump or mass in breast 05/29/2013   Breast lump 05/29/2013   Essential (primary) hypertension 04/26/2013   Cephalalgia 04/26/2013   Fibroid 04/26/2013   Muscle ache 04/26/2013   Benign intracranial hypertension 12/08/2012   Increased prolactin level ([place]) 12/08/2012   Anxiety state 06/12/2011    Past Surgical History:  Procedure Laterality Date   ABDOMINAL HYSTERECTOMY  02/06/13   BREAST BIOPSY Right October, 2014   right breast core biopsy, fibroadenomatous changes   BREAST BIOPSY Right December 2014   FNA retroareolar  nodule consistent with fibroadenoma.   BREAST BIOPSY Right 12/16/2016   Fibroadenoma in the retroareolar area at 6:00.   CESAREAN SECTION  1993, 1996, 1997   COLONOSCOPY  2015   Dr Allen Norris   RHINOPLASTY     TONSILECTOMY, ADENOIDECTOMY, BILATERAL MYRINGOTOMY AND TUBES  2004   UPPER GASTROINTESTINAL ENDOSCOPY  2015   Dr Allen Norris    Prior to Admission medications   Medication Sig Start Date End Date Taking? Authorizing Provider  acyclovir (ZOVIRAX) 400 MG tablet Take 1 tablet by mouth.    [provider]  albuterol (PROVENTIL HFA;VENTOLIN HFA) 108 (90 BASE) MCG/ACT inhaler Inhale 2 puffs into the lungs every 6 (six) hours as needed for wheezing.    [provider]  atorvastatin (LIPITOR) 10 MG tablet Take 1 tablet by mouth 1 day or 1 dose.    [provider]  Biotin 1000 MCG tablet Take 1,000 mcg by mouth 4 (four) times a week.    [provider]  citalopram (CELEXA) 10 MG tablet Take by mouth.    [provider]  clonazePAM (KLONOPIN) 1 MG tablet Take 1 mg by mouth 3 (three) times daily.    [provider]  dicyclomine (BENTYL) 10 MG capsule Take 1 capsule (10 mg total) by mouth 3 (three) times daily before meals. 11/16/16   Lucilla Lame, MD  dicyclomine (BENTYL) 20 MG tablet Take 1 tablet (20 mg total) by mouth 4 (four) times daily -  before meals and at  bedtime. 12/19/15   Lucilla Lame, MD  fluticasone (FLONASE) 50 MCG/ACT nasal spray 2 sprays by Each Nare route daily. 02/20/15 02/20/16  [provider]  hydrochlorothiazide (HYDRODIURIL) 25 MG tablet Take 25 mg by mouth daily.    [provider]  levothyroxine (SYNTHROID, LEVOTHROID) 125 MCG tablet Take 150 mcg by mouth daily before breakfast.     [provider]  linaclotide (LINZESS) 145 MCG CAPS capsule Take 1 capsule (145 mcg total) by mouth daily before breakfast. 11/16/16   Lucilla Lame, MD  omeprazole (PRILOSEC) 20 MG capsule Take by mouth.    [provider]  prednisoLONE acetate (PRED FORTE) 1 % ophthalmic suspension Place 1 drop into both eyes every 4 (four) hours.    [provider]  topiramate (TOPAMAX) 25 MG tablet Take 75 mg by mouth daily.    [provider]  traMADol (ULTRAM) 50 MG tablet Take 1 tablet (50 mg total) by mouth every 6 (six) hours as needed. 12/16/16   Robert Bellow, MD  Vitamin D, Ergocalciferol, (DRISDOL) 50000 units CAPS capsule Take 1 capsule by mouth. 08/28/15   [provider]  Vortioxetine HBr (BRINTELLIX) 10 MG TABS Take by mouth daily.    [provider]    Allergies Amoxicillin; Lisinopril; Other; and Penicillins  Family History  Problem Relation Age of Onset   Depression Mother    Migraines Mother    Diverticulitis Mother    Hypertension Mother    Heart disease Father    Diabetes Father    Hypertension Father     Social History Social History   Tobacco Use   Smoking status: Never Smoker   Smokeless tobacco: Never Used  Substance Use Topics   Alcohol use: Yes   Drug use: No    Review of Systems  Constitutional: No fever/chills Eyes: No visual changes. ENT: No sore throat. Cardiovascular: Denies chest pain. Respiratory: Denies shortness of breath. Gastrointestinal: See HPI Genitourinary: Negative for dysuria. Musculoskeletal: Negative for back pain. Skin: Negative for rash. Neurological: Negative for headaches, focal weakness   ____________________________________________   PHYSICAL EXAM:  VITAL SIGNS: ED Triage Vitals  Enc Vitals Group     BP 12/20/18 1846 132/69     Pulse Rate 12/20/18 1846 84     Resp 12/20/18 1846 17     Temp 12/20/18 1847 98.4 F (36.9 C)     Temp Source 12/20/18 1846 Oral     SpO2 12/20/18 1846 100 %     Weight 12/20/18 1847 288 lb (130.6 kg)     Height 12/20/18 1847 5\' 3"  (1.6 m)     Head Circumference --      Peak Flow --      Pain Score 12/20/18 1847 6     Pain Loc --      Pain Edu? --       Excl. in [place]? --     Constitutional: Alert and oriented. Well appearing and in no acute distress. Eyes: Conjunctivae are normal.  Head: Atraumatic. Nose: No congestion/rhinnorhea. Mouth/Throat: Mucous membranes are moist.  Oropharynx non-erythematous. Neck: No stridor. Cardiovascular: Normal rate, regular rhythm. Grossly normal heart sounds.  Good peripheral circulation. Respiratory: Normal respiratory effort.  No retractions. Lungs CTAB. Gastrointestinal: Soft tender to palpation in the right upper quadrant but not to percussion.  There is no rebound tenderness either.  No distention. No abdominal bruits. No CVA tenderness. Musculoskeletal: No lower extremity tenderness nor edema.  Neurologic:  Normal speech and language. No  gross focal neurologic deficits are appreciated.  Skin:  Skin is warm, dry and intact. No rash noted.   ____________________________________________   LABS (all labs ordered are listed, but only abnormal results are displayed)  Labs Reviewed  COMPREHENSIVE METABOLIC PANEL - Abnormal; Notable for the following components:      Result Value   Chloride 114 (*)    CO2 21 (*)    Calcium 8.5 (*)    AST 11 (*)    All other components within normal limits  CBC WITH DIFFERENTIAL/PLATELET - Abnormal; Notable for the following components:   WBC 10.9 (*)    All other components within normal limits  FIBRIN DERIVATIVES D-DIMER ([facility] ONLY) - Abnormal; Notable for the following components:   Fibrin derivatives D-dimer [facility]) 7,782.42 (*)    All other components within normal limits  LIPASE, BLOOD  POCT PREGNANCY, URINE   ____________________________________________  EKG   ____________________________________________  RADIOLOGY  ED MD interpretation: Ultrasound read by radiology shows no gallbladder pathology.  I reviewed the films after the radiologist read them.  CT shows what appears to be a pulmonary infarct.  Official radiology report(s): Ct Angio  Chest Pe W And/or Wo Contrast  Result Date: 12/20/2018 CLINICAL DATA:  Right upper quadrant pain with pleuritic chest pain. Positive D-dimer. EXAM: CT ANGIOGRAPHY CHEST WITH CONTRAST TECHNIQUE: Multidetector CT imaging of the chest was performed using the standard protocol during bolus administration of intravenous contrast. Multiplanar CT image reconstructions and MIPs were obtained to evaluate the vascular anatomy. CONTRAST:  42mL OMNIPAQUE IOHEXOL 350 MG/ML SOLN COMPARISON:  CTA chest 06/17/2005 FINDINGS: Cardiovascular: --Pulmonary arteries: Satisfactory contrast bolus. Beam attenuation caused by patient body habitus limits assessment beyond the lobar level.Within that limitation, there is no central pulmonary embolus. There is, however, a wedge-shaped focus of peripheral consolidation within the right lower lobe. The adjacent segmental pulmonary artery branch appears relatively hypodense compared to other branches at the same level, probably indicating a small embolus (series 5 image 145). The main pulmonary artery is within normal limits for size. --Aorta: Limited opacification of the aorta due to bolus timing optimization for the pulmonary arteries. Conventional 3 vessel aortic branching pattern. The aortic course and caliber are normal. There is no aortic atherosclerosis. --Heart: Normal size. No pericardial effusion. Mediastinum/Nodes: No mediastinal, hilar or axillary lymphadenopathy. The visualized thyroid and thoracic esophageal course are unremarkable. Lungs/Pleura: Small right pleural effusion, with associated atelectasis. As above, there is a wedge-shaped focus of peripheral consolidation in the right lower lobe. Upper Abdomen: Contrast bolus timing is not optimized for evaluation of the abdominal organs. Within this limitation, the visualized organs of the upper abdomen are normal. Musculoskeletal: No chest wall abnormality. No acute or significant osseous findings. Review of the MIP images  confirms the above findings. IMPRESSION: 1. Examination limited due to beam attenuation caused by patient body habitus, reducing sensitivity. 2. Wedge-shaped opacity in the right lower lobe lateral basal segment is most consistent with a pulmonary infarct. Relative hypoenhancement within the associated segmental artery probably represents a small pulmonary embolus, though it is difficult to be sure given limitations described above. 3. Small right pleural effusion with associated atelectasis. Critical Value/emergent results were called by telephone at the time of interpretation on 12/20/2018 at 10:23 pm to Dr. Conni Slipper , who verbally acknowledged these results. Electronically Signed   By: Ulyses Jarred M.D.   On: 12/20/2018 22:26   Dg Chest Portable 1 View  Result Date: 12/20/2018 CLINICAL DATA:  Pain and shortness of  breath EXAM: PORTABLE CHEST 1 VIEW COMPARISON:  April 20, 2007 FINDINGS: No edema or consolidation. The heart size and pulmonary vascularity are normal. No adenopathy. No pneumothorax. No bone lesions. IMPRESSION: No edema or consolidation. Electronically Signed   By: Lowella Grip III M.D.   On: 12/20/2018 20:02   US Abdomen Limited Ruq  Result Date: 12/20/2018 CLINICAL DATA:  Right upper quadrant pain EXAM: ULTRASOUND ABDOMEN LIMITED RIGHT UPPER QUADRANT COMPARISON:  CT abdomen and pelvis November 04, 2010 FINDINGS: Gallbladder: No gallstones or wall thickening visualized. There is no pericholecystic fluid. No sonographic Murphy sign noted by sonographer. Common bile duct: Diameter: 4 mm. No intrahepatic or extrahepatic biliary duct dilatation. Liver: No focal lesion identified. Within normal limits in parenchymal echogenicity. Portal vein is patent on color Doppler imaging with normal direction of blood flow towards the liver. There is a right pleural effusion. IMPRESSION: There is a right pleural effusion.  Study otherwise unremarkable. Electronically Signed   By: Lowella Grip  III M.D.   On: 12/20/2018 20:30    ____________________________________________   PROCEDURES  Procedure(s) performed (including Critical Care): Critical care time half an hour.  This includes reviewing her CT scan attempting to review her old records discussing her case with the radiologist and with the patient herself and then with the hospitalist.  Procedures   ____________________________________________   INITIAL IMPRESSION / [place] / ED COURSE   Patient with pulmonary infarct.  When I tell her she says no he has not had 1 of these before and feels the same.  We will get her in the hospital and start her on some heparin.  She was converted to Coumadin before we will probably do the same thing again.             ____________________________________________   FINAL CLINICAL IMPRESSION(S) / ED DIAGNOSES  Final diagnoses:  Pulmonary infarct [facility])     ED Discharge Orders    None       Note:  This document was prepared using Dragon voice recognition software and may include unintentional dictation errors.    Nena Polio, MD 12/20/18 2300    Nena Polio, MD 12/30/18 [phone]

## 2018-12-20 NOTE — ED Notes (Signed)
ED TO INPATIENT HANDOFF REPORT  ED Nurse Name and Phone #: Joshus Rogan [phone]  S Name/Age/Gender Angela [name] 43 y.o. female Room/Bed: ED32A/ED32A  Code Status   Code Status: Full Code  Home/SNF/Other Home Patient oriented to: self, place, time and situation Is this baseline? Yes   Triage Complete: Triage complete  Chief Complaint Upper abd pain/sob  Triage Note Pt c/o SOB with rib pain for the past 3 days. 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   Allergies Allergies  Allergen Reactions  . Amoxicillin Hives  . Lisinopril Cough  . Nortriptyline   . Other     Sulfa eye drops- Scratched corneas  . Penicillins Hives  . Sulfa Antibiotics     Level of Care/Admitting Diagnosis ED Disposition    ED Disposition Condition [facility] Area: [place] [[id number]]  Level of Care: Telemetry [5]  Covid Evaluation: N/A  Diagnosis: Pulmonary embolism with infarction [facility]) [[id number]]  Admitting Physician: Christel Mormon [[id number]]  Attending Physician: Christel Mormon [[id number]]  Estimated length of stay: past midnight tomorrow  Certification:: I certify this patient will need inpatient services for at least 2 midnights  PT Class (Do Not Modify): Inpatient [101]  PT Acc Code (Do Not Modify): Private [1]       B Medical/Surgery History Past Medical History:  Diagnosis Date  . Anemia   . Anxiety   . Depression   . Fibromyalgia   . GERD (gastroesophageal reflux disease)   . Hypertension   . Irritable bowel syndrome (IBS)   . Pseudotumor cerebri   . Thyroid disease    hypothyroid  . Vision disturbance    Past Surgical History:  Procedure Laterality Date  . ABDOMINAL HYSTERECTOMY  02/06/13  . BREAST BIOPSY Right October, 2014   right breast core biopsy, fibroadenomatous changes  . BREAST BIOPSY Right December 2014   FNA retroareolar nodule consistent with fibroadenoma.  Marland Kitchen BREAST BIOPSY  Right 12/16/2016   Fibroadenoma in the retroareolar area at 6:00.  Marland Kitchen [place]  . COLONOSCOPY  2015   Dr Allen Norris  . RHINOPLASTY    . TONSILECTOMY, ADENOIDECTOMY, BILATERAL MYRINGOTOMY AND TUBES  2004  . UPPER GASTROINTESTINAL ENDOSCOPY  2015   Dr Allen Norris     A IV Location/Drains/Wounds Patient Lines/Drains/Airways Status   Active Line/Drains/Airways    Name:   Placement date:   Placement time:   Site:   Days:   Peripheral IV 12/20/18 Left Antecubital   12/20/18    1859    Antecubital   less than 1   Peripheral IV 12/20/18 Right Antecubital   12/20/18    2321    Antecubital   less than 1          Intake/Output Last 24 hours No intake or output data in the 24 hours ending 12/20/18 2351  Labs/Imaging Results for orders placed or performed during the hospital encounter of 12/20/18 (from the past 48 hour(s))  Comprehensive metabolic panel     Status: Abnormal   Collection Time: 12/20/18  6:59 PM  Result Value Ref Range   Sodium 140 135 - 145 mmol/L   Potassium 3.9 3.5 - 5.1 mmol/L   Chloride 114 (H) 98 - 111 mmol/L   CO2 21 (L) 22 - 32 mmol/L   Glucose, Bld 89 70 - 99 mg/dL   BUN 11 6 - 20 mg/dL  Creatinine, Ser 0.94 0.44 - 1.00 mg/dL   Calcium 8.5 (L) 8.9 - 10.3 mg/dL   Total Protein 6.9 6.5 - 8.1 g/dL   Albumin 3.5 3.5 - 5.0 g/dL   AST 11 (L) 15 - 41 U/L   ALT 9 0 - 44 U/L   Alkaline Phosphatase 53 38 - 126 U/L   Total Bilirubin 0.3 0.3 - 1.2 mg/dL   GFR calc non Af Amer >60 >60 mL/min   GFR calc Af Amer >60 >60 mL/min   Anion gap 5 5 - 15    Comment: Performed at [facility], [address] [place], [place] [address]  Lipase, blood     Status: None   Collection Time: 12/20/18  6:59 PM  Result Value Ref Range   Lipase 31 11 - 51 U/L    Comment: Performed at [facility], [address] [place], [place] [address]  CBC with Differential     Status: Abnormal   Collection Time: 12/20/18  6:59 PM  Result Value Ref Range   WBC  10.9 (H) 4.0 - 10.5 K/uL   RBC 4.40 3.87 - 5.11 MIL/uL   Hemoglobin 12.4 12.0 - 15.0 g/dL   HCT 37.9 36.0 - 46.0 %   MCV 86.1 80.0 - 100.0 fL   MCH 28.2 26.0 - 34.0 pg   MCHC 32.7 30.0 - 36.0 g/dL   RDW 13.2 11.5 - 15.5 %   Platelets 397 150 - 400 K/uL   nRBC 0.0 0.0 - 0.2 %   Neutrophils Relative % 66 %   Neutro Abs 7.2 1.7 - 7.7 K/uL   Lymphocytes Relative 25 %   Lymphs Abs 2.7 0.7 - 4.0 K/uL   Monocytes Relative 7 %   Monocytes Absolute 0.7 0.1 - 1.0 K/uL   Eosinophils Relative 1 %   Eosinophils Absolute 0.2 0.0 - 0.5 K/uL   Basophils Relative 1 %   Basophils Absolute 0.1 0.0 - 0.1 K/uL   Immature Granulocytes 0 %   Abs Immature Granulocytes 0.04 0.00 - 0.07 K/uL    Comment: Performed at [facility], [address] [place], [place] [address]  Fibrin derivatives D-Dimer     Status: Abnormal   Collection Time: 12/20/18  6:59 PM  Result Value Ref Range   Fibrin derivatives D-dimer ([facility]) 1,706.47 (H) 0.00 - 499.00 ng/mL (FEU)    Comment: (NOTE) <> Exclusion of Venous Thromboembolism (VTE) - OUTPATIENT ONLY   (Emergency Department or [place])   0-499 ng/ml (FEU): With a low to intermediate pretest probability                      for VTE this test result excludes the diagnosis                      of VTE.   >499 ng/ml (FEU) : VTE not excluded; additional work up for VTE is                      required. <> Testing on Inpatients and Evaluation of Disseminated Intravascular   Coagulation (DIC) Reference Range:   0-499 ng/ml (FEU) Performed at [facility], [address] [place], [place] [address]   Pregnancy, urine POC     Status: None   Collection Time: 12/20/18  7:05 PM  Result Value Ref Range   Preg Test, Ur NEGATIVE NEGATIVE    Comment:  THE SENSITIVITY OF THIS METHODOLOGY IS >24 mIU/mL    Ct Angio Chest Pe W And/or Wo Contrast  Result Date: 12/20/2018 CLINICAL DATA:  Right upper quadrant pain with pleuritic chest pain. Positive  D-dimer. EXAM: CT ANGIOGRAPHY CHEST WITH CONTRAST TECHNIQUE: Multidetector CT imaging of the chest was performed using the standard protocol during bolus administration of intravenous contrast. Multiplanar CT image reconstructions and MIPs were obtained to evaluate the vascular anatomy. CONTRAST:  76mL OMNIPAQUE IOHEXOL 350 MG/ML SOLN COMPARISON:  CTA chest 06/17/2005 FINDINGS: Cardiovascular: --Pulmonary arteries: Satisfactory contrast bolus. Beam attenuation caused by patient body habitus limits assessment beyond the lobar level.Within that limitation, there is no central pulmonary embolus. There is, however, a wedge-shaped focus of peripheral consolidation within the right lower lobe. The adjacent segmental pulmonary artery branch appears relatively hypodense compared to other branches at the same level, probably indicating a small embolus (series 5 image 145). The main pulmonary artery is within normal limits for size. --Aorta: Limited opacification of the aorta due to bolus timing optimization for the pulmonary arteries. Conventional 3 vessel aortic branching pattern. The aortic course and caliber are normal. There is no aortic atherosclerosis. --Heart: Normal size. No pericardial effusion. Mediastinum/Nodes: No mediastinal, hilar or axillary lymphadenopathy. The visualized thyroid and thoracic esophageal course are unremarkable. Lungs/Pleura: Small right pleural effusion, with associated atelectasis. As above, there is a wedge-shaped focus of peripheral consolidation in the right lower lobe. Upper Abdomen: Contrast bolus timing is not optimized for evaluation of the abdominal organs. Within this limitation, the visualized organs of the upper abdomen are normal. Musculoskeletal: No chest wall abnormality. No acute or significant osseous findings. Review of the MIP images confirms the above findings. IMPRESSION: 1. Examination limited due to beam attenuation caused by patient body habitus, reducing sensitivity.  2. Wedge-shaped opacity in the right lower lobe lateral basal segment is most consistent with a pulmonary infarct. Relative hypoenhancement within the associated segmental artery probably represents a small pulmonary embolus, though it is difficult to be sure given limitations described above. 3. Small right pleural effusion with associated atelectasis. Critical Value/emergent results were called by telephone at the time of interpretation on 12/20/2018 at 10:23 pm to Dr. Conni Slipper , who verbally acknowledged these results. Electronically Signed   By: Ulyses Jarred M.D.   On: 12/20/2018 22:26   Dg Chest Portable 1 View  Result Date: 12/20/2018 CLINICAL DATA:  Pain and shortness of breath EXAM: PORTABLE CHEST 1 VIEW COMPARISON:  April 20, 2007 FINDINGS: No edema or consolidation. The heart size and pulmonary vascularity are normal. No adenopathy. No pneumothorax. No bone lesions. IMPRESSION: No edema or consolidation. Electronically Signed   By: Lowella Grip III M.D.   On: 12/20/2018 20:02   US Abdomen Limited Ruq  Result Date: 12/20/2018 CLINICAL DATA:  Right upper quadrant pain EXAM: ULTRASOUND ABDOMEN LIMITED RIGHT UPPER QUADRANT COMPARISON:  CT abdomen and pelvis November 04, 2010 FINDINGS: Gallbladder: No gallstones or wall thickening visualized. There is no pericholecystic fluid. No sonographic Murphy sign noted by sonographer. Common bile duct: Diameter: 4 mm. No intrahepatic or extrahepatic biliary duct dilatation. Liver: No focal lesion identified. Within normal limits in parenchymal echogenicity. Portal vein is patent on color Doppler imaging with normal direction of blood flow towards the liver. There is a right pleural effusion. IMPRESSION: There is a right pleural effusion.  Study otherwise unremarkable. Electronically Signed   By: Lowella Grip III M.D.   On: 12/20/2018 20:30    Pending Labs [employer] (From  admission, onward)    Start     Ordered   12/21/18 8250  Basic  metabolic panel  Tomorrow morning,   STAT     12/20/18 2347   12/21/18 0500  CBC  Tomorrow morning,   STAT     12/20/18 2347   12/21/18 0500  Protime-INR  Daily,   STAT     12/20/18 2347   12/20/18 2320  APTT  ONCE - STAT,   STAT     12/20/18 2319   12/20/18 2320  Protime-INR  ONCE - STAT,   STAT     12/20/18 2319          Vitals/Pain Today's Vitals   12/20/18 1847 12/20/18 1854 12/20/18 2107 12/20/18 2112  BP:    116/62  Pulse:    62  Resp:    18  Temp: 98.4 F (36.9 C)     TempSrc:      SpO2:  100%  100%  Weight: 130.6 kg     Height: 5\' 3"  (1.6 m)     PainSc: 6   5  0-No pain    Isolation Precautions No active isolations  Medications Medications  heparin bolus via infusion 5,000 Units (5,000 Units Intravenous Bolus from Bag 12/20/18 2322)    Followed by  heparin ADULT infusion 100 units/mL (25000 units/272mL sodium chloride 0.45%) (1,500 Units/hr Intravenous New Bag/Given 12/20/18 2322)  atorvastatin (LIPITOR) tablet 10 mg (has no administration in time range)  hydrochlorothiazide (HYDRODIURIL) tablet 25 mg (has no administration in time range)  citalopram (CELEXA) tablet 10 mg (has no administration in time range)  levothyroxine (SYNTHROID) tablet 150 mcg (has no administration in time range)  vortioxetine HBr (TRINTELLIX) tablet 10 mg (has no administration in time range)  dicyclomine (BENTYL) tablet 20 mg (has no administration in time range)  linaclotide (LINZESS) capsule 145 mcg (has no administration in time range)  pantoprazole (PROTONIX) EC tablet 40 mg (has no administration in time range)  clonazePAM (KLONOPIN) tablet 1 mg (has no administration in time range)  topiramate (TOPAMAX) tablet 75 mg (has no administration in time range)  Biotin 1 mg (has no administration in time range)  Vitamin D (Ergocalciferol) (DRISDOL) capsule 50,000 Units (has no administration in time range)  albuterol (VENTOLIN HFA) 108 (90 Base) MCG/ACT inhaler 2 puff (has no  administration in time range)  prednisoLONE acetate (PRED FORTE) 1 % ophthalmic suspension 1 drop (has no administration in time range)  0.9 %  sodium chloride infusion (has no administration in time range)  acetaminophen (TYLENOL) tablet 650 mg (has no administration in time range)    Or  acetaminophen (TYLENOL) suppository 650 mg (has no administration in time range)  traZODone (DESYREL) tablet 25 mg (has no administration in time range)  magnesium hydroxide (MILK OF MAGNESIA) suspension 30 mL (has no administration in time range)  ondansetron (ZOFRAN) tablet 4 mg (has no administration in time range)    Or  ondansetron (ZOFRAN) injection 4 mg (has no administration in time range)  warfarin (COUMADIN) tablet 5 mg (has no administration in time range)  warfarin (COUMADIN) tablet 5 mg (has no administration in time range)  morphine 4 MG/ML injection 4 mg (4 mg Intravenous Given 12/20/18 2002)  ondansetron (ZOFRAN) injection 4 mg (4 mg Intravenous Given 12/20/18 2003)  iohexol (OMNIPAQUE) 350 MG/ML injection 75 mL (75 mLs Intravenous Contrast Given 12/20/18 2205)    Mobility walks Low fall risk   Focused Assessments Pulmonary Assessment Handoff:  Lung sounds:  O2 Device: Room Air        R Recommendations: See Admitting Provider Note  Report given to:   Additional Notes:  Pt c/o headache, she did not receive her injection today

## 2018-12-20 NOTE — ED Notes (Signed)
Returned from ct angio.

## 2018-12-20 NOTE — ED Notes (Signed)
Reports abd pain / diaphragm pain x 2 days today pain on the right side worse and shoots into her back.

## 2018-12-20 NOTE — ED Notes (Signed)
meds given . U/S at bedside.

## 2018-12-20 NOTE — ED Notes (Signed)
Chest xray completed.

## 2018-12-20 NOTE — ED Triage Notes (Signed)
Pt c/o SOB with rib pain for the past 3 days. 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

## 2018-12-20 NOTE — ED Notes (Signed)
Awaiting ct angio

## 2018-12-21 ENCOUNTER — Inpatient Hospital Stay
Admit: 2018-12-21 | Discharge: 2018-12-21 | Disposition: A | Discharge status: Home / Self Care | Payer: Medicare Other | Attending: Family Medicine | Admitting: Family Medicine

## 2018-12-21 ENCOUNTER — Inpatient Hospital Stay: Payer: Medicare Other

## 2018-12-21 LAB — CBC
HCT: 37.6 % (ref 36.0–46.0)
Hemoglobin: 12.5 g/dL (ref 12.0–15.0)
MCH: 28.3 pg (ref 26.0–34.0)
MCHC: 33.2 g/dL (ref 30.0–36.0)
MCV: 85.3 fL (ref 80.0–100.0)
Platelets: 376 10*3/uL (ref 150–400)
RBC: 4.41 MIL/uL (ref 3.87–5.11)
RDW: 13.2 % (ref 11.5–15.5)
WBC: 11.6 10*3/uL — ABNORMAL HIGH (ref 4.0–10.5)
nRBC: 0 % (ref 0.0–0.2)

## 2018-12-21 LAB — BASIC METABOLIC PANEL
Anion gap: 9 (ref 5–15)
BUN: 9 mg/dL (ref 6–20)
CO2: 17 mmol/L — ABNORMAL LOW (ref 22–32)
Calcium: 8.6 mg/dL — ABNORMAL LOW (ref 8.9–10.3)
Chloride: 115 mmol/L — ABNORMAL HIGH (ref 98–111)
Creatinine, Ser: 0.85 mg/dL (ref 0.44–1.00)
GFR calc Af Amer: >60 mL/min (ref >60)
GFR calc non Af Amer: >60 mL/min (ref >60)
Glucose, Bld: 102 mg/dL — ABNORMAL HIGH (ref 70–99)
Potassium: 3.8 mmol/L (ref 3.5–5.1)
Sodium: 141 mmol/L (ref 135–145)

## 2018-12-21 LAB — HEPARIN LEVEL (UNFRACTIONATED)
Heparin Unfractionated: 0.54 IU/mL (ref 0.30–0.70)
Heparin Unfractionated: 0.62 IU/mL (ref 0.30–0.70)

## 2018-12-21 LAB — SARS CORONAVIRUS 2 BY RT PCR (HOSPITAL ORDER, PERFORMED IN ~~LOC~~ HOSPITAL LAB): SARS Coronavirus 2: NEGATIVE

## 2018-12-21 LAB — TSH: TSH: 3.967 u[IU]/mL (ref 0.350–4.500)

## 2018-12-21 LAB — ECHOCARDIOGRAM COMPLETE
Height: 63 in
Weight: 4608 oz

## 2018-12-21 LAB — PROTIME-INR
INR: 1.1 (ref 0.8–1.2)
Prothrombin Time: 14.2 seconds (ref 11.4–15.2)

## 2018-12-21 MED ORDER — ROSUVASTATIN CALCIUM 10 MG PO TABS
20.0000 mg | ORAL_TABLET | Freq: Every day | ORAL | Status: DC
  Administered 2018-12-21 – 2018-12-23 (×3): 20 mg via ORAL
  Filled 2018-12-21 (×3): qty 2

## 2018-12-21 MED ORDER — BUPRENORPHINE HCL 150 MCG BU FILM: 150.0000 ug | ORAL_FILM | Freq: Every day | BUCCAL | Status: DC

## 2018-12-21 MED ORDER — AMLODIPINE BESYLATE 10 MG PO TABS
10.0000 mg | ORAL_TABLET | Freq: Every day | ORAL | Status: DC
  Filled 2018-12-21 (×2): qty 1

## 2018-12-21 MED ORDER — TIZANIDINE HCL 4 MG PO TABS
4.0000 mg | ORAL_TABLET | Freq: Three times a day (TID) | ORAL | Status: DC | PRN
  Administered 2018-12-22 – 2018-12-23 (×3): 4 mg via ORAL
  Filled 2018-12-21 (×4): qty 1

## 2018-12-21 MED ORDER — ACETAZOLAMIDE 250 MG PO TABS
250.0000 mg | ORAL_TABLET | Freq: Two times a day (BID) | ORAL | Status: DC
  Administered 2018-12-21 – 2018-12-23 (×5): 250 mg via ORAL
  Filled 2018-12-21 (×7): qty 1

## 2018-12-21 MED ORDER — ACETAZOLAMIDE 250 MG PO TABS
250.0000 mg | ORAL_TABLET | Freq: Two times a day (BID) | ORAL | Status: DC
  Filled 2018-12-21: qty 1

## 2018-12-21 MED ORDER — TOPIRAMATE 100 MG PO TABS
100.0000 mg | ORAL_TABLET | Freq: Two times a day (BID) | ORAL | Status: DC
  Administered 2018-12-21 – 2018-12-23 (×5): 100 mg via ORAL
  Filled 2018-12-21 (×6): qty 1

## 2018-12-21 MED ORDER — KETOROLAC TROMETHAMINE 15 MG/ML IJ SOLN
15.0000 mg | Freq: Four times a day (QID) | INTRAMUSCULAR | Status: DC | PRN
  Administered 2018-12-21 – 2018-12-23 (×5): 15 mg via INTRAVENOUS
  Filled 2018-12-21 (×6): qty 1

## 2018-12-21 MED ORDER — BUPROPION HCL ER (SR) 150 MG PO TB12
150.0000 mg | ORAL_TABLET | Freq: Every day | ORAL | Status: DC
  Administered 2018-12-21 – 2018-12-23 (×3): 150 mg via ORAL
  Filled 2018-12-21 (×3): qty 1

## 2018-12-21 MED ORDER — ERENUMAB-AOOE 140 MG/ML ~~LOC~~ SOAJ: 140.0000 mg | SUBCUTANEOUS | Status: DC

## 2018-12-21 MED ORDER — WARFARIN - PHARMACIST DOSING INPATIENT
Freq: Every day | Status: DC
  Administered 2018-12-21 – 2018-12-22 (×2)

## 2018-12-21 MED ORDER — WARFARIN - PHYSICIAN DOSING INPATIENT: Freq: Every day | Status: DC

## 2018-12-21 MED ORDER — PNEUMOCOCCAL VAC POLYVALENT 25 MCG/0.5ML IJ INJ
0.5000 mL | INJECTION | INTRAMUSCULAR | Status: AC
  Administered 2018-12-22: 0.5 mL via INTRAMUSCULAR
  Filled 2018-12-21: qty 0.5

## 2018-12-21 MED ORDER — OXYBUTYNIN CHLORIDE 5 MG PO TABS
10.0000 mg | ORAL_TABLET | Freq: Every day | ORAL | Status: DC
  Administered 2018-12-21 – 2018-12-22 (×3): 10 mg via ORAL
  Filled 2018-12-21 (×3): qty 2

## 2018-12-21 MED ORDER — ESCITALOPRAM OXALATE 10 MG PO TABS
20.0000 mg | ORAL_TABLET | Freq: Every day | ORAL | Status: DC
  Administered 2018-12-21 – 2018-12-22 (×3): 20 mg via ORAL
  Filled 2018-12-21 (×4): qty 2

## 2018-12-21 NOTE — Consult Note (Signed)
[place] for Heparin and Warfarin Indication: pulmonary embolus/Pulmonary infarct   Allergies  Allergen Reactions  . Amoxicillin Hives  . Lisinopril Cough  . Nortriptyline   . Other     Sulfa eye drops- Scratched corneas  . Penicillins Hives  . Sulfa Antibiotics     Patient Measurements: Height: 5\' 3"  (160 cm) Weight: 288 lb (130.6 kg) IBW/kg (Calculated) : 52.4 Heparin Dosing Weight: 85kg  Vital Signs: Temp: 97.8 F (36.6 C) (05/13 0739) Temp Source: Oral (05/13 0739) BP: 100/63 (05/13 0739) Pulse Rate: 64 (05/13 0739)  Labs: Recent Labs    12/20/18 1859 12/20/18 2325 12/21/18 0529 12/21/18 1240  HGB 12.4  --  12.5  --   HCT 37.9  --  37.6  --   PLT 397  --  376  --   APTT  --  40*  --   --   LABPROT  --  14.1 14.2  --   INR  --  1.1 1.1  --   HEPARINUNFRC  --   --  0.54 0.62  CREATININE 0.94  --  0.85  --     Estimated Creatinine Clearance: 112.8 mL/min (by C-G formula based on SCr of 0.85 mg/dL).   Medical History: Past Medical History:  Diagnosis Date  . Anemia   . Anxiety   . Depression   . Fibromyalgia   . GERD (gastroesophageal reflux disease)   . Hypertension   . Irritable bowel syndrome (IBS)   . Pseudotumor cerebri   . Thyroid disease    hypothyroid  . Vision disturbance     Assessment: Pharmacy consulted for heparin drip dosing and monitoring for 43 yo female admitted with pulmonary embolus/Pulmonary infarct. Started on warfarin. Per notes pt was on warfarin previously for past PE/DVT. DOAC would not be recommended in this patient as the ISTH recommend to avoid the use of DOAC in patient's with a BMI > 40. BMI 51. Warfarin would be the best anticoagulation agent for this patient with the current data.   5/12 Heparin infusion started @ 1500 units/hr  5/13 @0529  HL 0.54  5/13 @0 .62 HL 0.62    Date INR Warfarin Dose  5/12 1.1   5/13 1.1 5 mg @0300  + 5 mg 1800.     Goal of Therapy:  Heparin  level 0.3-0.7 units/ml Monitor platelets by anticoagulation protocol: Yes   Plan:  Dosing weight: 85 kg  Heparin level therapeutic x 2. Will continue current infusion rate of 1500 units/hr. Will recheck heparin level with AM labs. Check anti-Xa level and CBCs daily while on heparin. Will start warfarin 5 mg tonight. Pt is young and has a BMI of 51. May need higher warfarin dose to become therapeutic. Daily INR ordered. Pharmacy will continue to monitor H&H and platelets and follow.   Oswald Hillock, PharmD, BCPS Clinical Pharmacist 12/21/2018 1:05 PM

## 2018-12-21 NOTE — ED Notes (Signed)
Attempted to call report

## 2018-12-21 NOTE — Consult Note (Addendum)
[place] for Heparin and Warfarin Indication: pulmonary embolus/Pulmonary infarct   Allergies  Allergen Reactions  . Amoxicillin Hives  . Lisinopril Cough  . Nortriptyline   . Other     Sulfa eye drops- Scratched corneas  . Penicillins Hives  . Sulfa Antibiotics     Patient Measurements: Height: 5\' 3"  (160 cm) Weight: 288 lb (130.6 kg) IBW/kg (Calculated) : 52.4 Heparin Dosing Weight: 85kg  Vital Signs: Temp: 97.8 F (36.6 C) (05/13 0739) Temp Source: Oral (05/13 0739) BP: 100/63 (05/13 0739) Pulse Rate: 64 (05/13 0739)  Labs: Recent Labs    12/20/18 1859 12/20/18 2325 12/21/18 0529  HGB 12.4  --  12.5  HCT 37.9  --  37.6  PLT 397  --  376  APTT  --  40*  --   LABPROT  --  14.1 14.2  INR  --  1.1 1.1  HEPARINUNFRC  --   --  0.54  CREATININE 0.94  --  0.85    Estimated Creatinine Clearance: 112.8 mL/min (by C-G formula based on SCr of 0.85 mg/dL).   Medical History: Past Medical History:  Diagnosis Date  . Anemia   . Anxiety   . Depression   . Fibromyalgia   . GERD (gastroesophageal reflux disease)   . Hypertension   . Irritable bowel syndrome (IBS)   . Pseudotumor cerebri   . Thyroid disease    hypothyroid  . Vision disturbance     Assessment: Pharmacy consulted for heparin drip dosing and monitoring for 43 yo female admitted with pulmonary embolus/Pulmonary infarct. Started on warfarin. Per notes pt was on warfarin previously for past PE/DVT. DOAC would not be recommended in this patient as the ISTH recommend to avoid the use of DOAC in patient's with a BMI > 40. BMI 51. Warfarin would be the best anticoagulation agent for this patient with the current data.   5/12 Heparin infusion started @ 1500 units/hr   Date INR Warfarin Dose  5/12 1.1   5/13 1.1 5 mg @0300  + 5 mg 1800.     Goal of Therapy:  Heparin level 0.3-0.7 units/ml Monitor platelets by anticoagulation protocol: Yes   Plan:  Dosing weight:  85 kg  5/13 @ 0529 0.54. Level is therapeutic x1. Will continue current infusion rate of 1500 units/hr. Will recheck confirmatory level in 6 hours. Check anti-Xa level and CBCs daily while on heparin. Will start warfarin 5 mg tonight. Pt is young and has a BMI of 51. May need higher warfarin dose to become therapeutic. Daily INR ordered. Pharmacy will continue to monitor H&H and platelets and follow.   Oswald Hillock, PharmD, BCPS Clinical Pharmacist 12/21/2018 8:36 AM

## 2018-12-21 NOTE — H&P (Signed)
[place] [facility] [place] NAME: Angela [name]    MR#:  [id number]  DATE OF BIRTH:  1976/07/09  DATE OF ADMISSION:  12/20/2018  PRIMARY CARE PHYSICIAN: Sharyne Peach, MD   REQUESTING/REFERRING PHYSICIAN: Conni Slipper, MD  CHIEF COMPLAINT:   Chief Complaint  Patient presents with  . Shortness of Breath    HISTORY OF PRESENT ILLNESS:  Angela [name]  is a 43 y.o. obese African-American female with a known history of multiple medical problems that will be mentioned below including left lower extremity DVT and PE in 2007 that was thought to be related to oral contraceptive pills then who presented to the emergency room with acute onset of chest discomfort increasing with deep breathing over the last 4 days on both sides of the chest mainly in the right lower parasternal area ith associated worsening dyspnea without cough.  Her pain is been on both sides of the chest mainly in the right parasternal, with mild wheezing without fever or chills or significant cough.  She denied any hemoptysis.  No other bleeding diathesis.  She denies any worsening lower extremity edema or pain.  No recent travels.  When she came to the ER, vital signs were within normal.  Labs revealed a d-dimer of 1706.  Chest CTA showed which shaped opacity in the right lower lobe in the lateral basal segment there is most consistent with pulmonary infarct with relative hypoenhancement within the associated segmental artery probably representing a small pulmonary embolus.  It showed small right pleural effusion with associated atelectasis.  Her urine pregnancy test came back negative and her COVID-19 test was negative as well.  The patient was given IV heparin bolus and drip, IV morphine sulfate and Zofran.  She will be admitted to a telemetry bed for further evaluation and management.   PAST MEDICAL HISTORY:   Past Medical History:  Diagnosis Date  . Anemia   . Anxiety   .  Depression   . Fibromyalgia   . GERD (gastroesophageal reflux disease)   . Hypertension   . Irritable bowel syndrome (IBS)   . Pseudotumor cerebri   . Thyroid disease    hypothyroid  . Vision disturbance   *Left leg DVT and pulmonary embolism thought to be related to oral contraceptive pills in 2007  PAST SURGICAL HISTORY:   Past Surgical History:  Procedure Laterality Date  . ABDOMINAL HYSTERECTOMY  02/06/13  . BREAST BIOPSY Right October, 2014   right breast core biopsy, fibroadenomatous changes  . BREAST BIOPSY Right December 2014   FNA retroareolar nodule consistent with fibroadenoma.  Marland Kitchen BREAST BIOPSY Right 12/16/2016   Fibroadenoma in the retroareolar area at 6:00.  Marland Kitchen [place]  . COLONOSCOPY  2015   Dr Allen Norris  . RHINOPLASTY    . TONSILECTOMY, ADENOIDECTOMY, BILATERAL MYRINGOTOMY AND TUBES  2004  . UPPER GASTROINTESTINAL ENDOSCOPY  2015   Dr Allen Norris    SOCIAL HISTORY:   Social History   Tobacco Use  . Smoking status: Never Smoker  . Smokeless tobacco: Never Used  Substance Use Topics  . Alcohol use: Yes    FAMILY HISTORY:   Family History  Problem Relation Age of Onset  . Depression Mother   . Migraines Mother   . Diverticulitis Mother   . Hypertension Mother   . Heart disease Father   . Diabetes Father   . Hypertension Father     DRUG ALLERGIES:   Allergies  Allergen Reactions  . Amoxicillin Hives  . Lisinopril Cough  . Nortriptyline   . Other     Sulfa eye drops- Scratched corneas  . Penicillins Hives  . Sulfa Antibiotics     REVIEW OF SYSTEMS:   ROS As per history of present illness. All pertinent systems were reviewed above. Constitutional,  HEENT, cardiovascular, respiratory, GI, GU, musculoskeletal, neuro, psychiatric, endocrine,  integumentary and hematologic systems were reviewed and are otherwise  negative/unremarkable except for positive findings mentioned above in the HPI.   MEDICATIONS AT HOME:   Prior  to Admission medications   Medication Sig Start Date End Date Taking? Authorizing Provider  acyclovir (ZOVIRAX) 400 MG tablet Take 1 tablet by mouth.    [provider]  albuterol (PROVENTIL HFA;VENTOLIN HFA) 108 (90 BASE) MCG/ACT inhaler Inhale 2 puffs into the lungs every 6 (six) hours as needed for wheezing.    [provider]  atorvastatin (LIPITOR) 10 MG tablet Take 1 tablet by mouth 1 day or 1 dose.    [provider]  Biotin 1000 MCG tablet Take 1,000 mcg by mouth 4 (four) times a week.    [provider]  citalopram (CELEXA) 10 MG tablet Take by mouth.    [provider]  clonazePAM (KLONOPIN) 1 MG tablet Take 1 mg by mouth 3 (three) times daily.    [provider]  dicyclomine (BENTYL) 10 MG capsule Take 1 capsule (10 mg total) by mouth 3 (three) times daily before meals. 11/16/16   Lucilla Lame, MD  dicyclomine (BENTYL) 20 MG tablet Take 1 tablet (20 mg total) by mouth 4 (four) times daily -  before meals and at bedtime. 12/19/15   Lucilla Lame, MD  fluticasone (FLONASE) 50 MCG/ACT nasal spray 2 sprays by Each Nare route daily. 02/20/15 02/20/16  [provider]  hydrochlorothiazide (HYDRODIURIL) 25 MG tablet Take 25 mg by mouth daily.    [provider]  levothyroxine (SYNTHROID, LEVOTHROID) 125 MCG tablet Take 150 mcg by mouth daily before breakfast.     [provider]  linaclotide (LINZESS) 145 MCG CAPS capsule Take 1 capsule (145 mcg total) by mouth daily before breakfast. 11/16/16   Lucilla Lame, MD  omeprazole (PRILOSEC) 20 MG capsule Take by mouth.    [provider]  prednisoLONE acetate (PRED FORTE) 1 % ophthalmic suspension Place 1 drop into both eyes every 4 (four) hours.    [provider]  topiramate (TOPAMAX) 25 MG tablet Take 75 mg by mouth daily.    [provider]  traMADol (ULTRAM) 50 MG tablet Take 1 tablet (50 mg total) by mouth every 6 (six) hours as needed. 12/16/16    Robert Bellow, MD  Vitamin D, Ergocalciferol, (DRISDOL) 50000 units CAPS capsule Take 1 capsule by mouth. 08/28/15   [provider]  Vortioxetine HBr (BRINTELLIX) 10 MG TABS Take by mouth daily.    [provider]      VITAL SIGNS:  Blood pressure 129/73, pulse 76, temperature 98.7 F (37.1 C), temperature source Oral, resp. rate 20, height 5\' 3"  (1.6 m), weight 130.6 kg, SpO2 100 %.  PHYSICAL EXAMINATION:  Physical Exam  GENERAL:  43 y.o.-year-old obese African-American female patient lying in the bed with no acute distress.  EYES: Pupils equal, round, reactive to light and accommodation. No scleral icterus. Extraocular muscles intact.  HEENT: Head atraumatic, normocephalic. Oropharynx and nasopharynx clear.  NECK:  Supple, no jugular venous distention. No thyroid enlargement, no tenderness.  LUNGS: Normal  breath sounds bilaterally, no wheezing, rales,rhonchi or crepitation. No use of accessory muscles of respiration.  CARDIOVASCULAR: Regular rate and rhythm, S1, S2 normal. No murmurs, rubs, or gallops.  ABDOMEN: Soft, nondistended, nontender. Bowel sounds present. No organomegaly or mass.  EXTREMITIES: No pedal edema, cyanosis, or clubbing.  Negative Homans sign. NEUROLOGIC: Cranial nerves II through XII are intact. Muscle strength 5/5 in all extremities. Sensation intact. Gait not checked.  PSYCHIATRIC: The patient is alert and oriented x 3.  Normal affect and good eye contact. SKIN: No obvious rash, lesion, or ulcer.   LABORATORY PANEL:   CBC Recent Labs  Lab 12/20/18 1859  WBC 10.9*  HGB 12.4  HCT 37.9  PLT 397   ------------------------------------------------------------------------------------------------------------------  Chemistries  Recent Labs  Lab 12/20/18 1859  NA 140  K 3.9  CL 114*  CO2 21*  GLUCOSE 89  BUN 11  CREATININE 0.94  CALCIUM 8.5*  AST 11*  ALT 9  ALKPHOS 53  BILITOT 0.3    ------------------------------------------------------------------------------------------------------------------  Cardiac Enzymes No results for input(s): TROPONINI in the last 168 hours. ------------------------------------------------------------------------------------------------------------------  RADIOLOGY:  Ct Angio Chest Pe W And/or Wo Contrast  Result Date: 12/20/2018 CLINICAL DATA:  Right upper quadrant pain with pleuritic chest pain. Positive D-dimer. EXAM: CT ANGIOGRAPHY CHEST WITH CONTRAST TECHNIQUE: Multidetector CT imaging of the chest was performed using the standard protocol during bolus administration of intravenous contrast. Multiplanar CT image reconstructions and MIPs were obtained to evaluate the vascular anatomy. CONTRAST:  20mL OMNIPAQUE IOHEXOL 350 MG/ML SOLN COMPARISON:  CTA chest 06/17/2005 FINDINGS: Cardiovascular: --Pulmonary arteries: Satisfactory contrast bolus. Beam attenuation caused by patient body habitus limits assessment beyond the lobar level.Within that limitation, there is no central pulmonary embolus. There is, however, a wedge-shaped focus of peripheral consolidation within the right lower lobe. The adjacent segmental pulmonary artery branch appears relatively hypodense compared to other branches at the same level, probably indicating a small embolus (series 5 image 145). The main pulmonary artery is within normal limits for size. --Aorta: Limited opacification of the aorta due to bolus timing optimization for the pulmonary arteries. Conventional 3 vessel aortic branching pattern. The aortic course and caliber are normal. There is no aortic atherosclerosis. --Heart: Normal size. No pericardial effusion. Mediastinum/Nodes: No mediastinal, hilar or axillary lymphadenopathy. The visualized thyroid and thoracic esophageal course are unremarkable. Lungs/Pleura: Small right pleural effusion, with associated atelectasis. As above, there is a wedge-shaped focus of  peripheral consolidation in the right lower lobe. Upper Abdomen: Contrast bolus timing is not optimized for evaluation of the abdominal organs. Within this limitation, the visualized organs of the upper abdomen are normal. Musculoskeletal: No chest wall abnormality. No acute or significant osseous findings. Review of the MIP images confirms the above findings. IMPRESSION: 1. Examination limited due to beam attenuation caused by patient body habitus, reducing sensitivity. 2. Wedge-shaped opacity in the right lower lobe lateral basal segment is most consistent with a pulmonary infarct. Relative hypoenhancement within the associated segmental artery probably represents a small pulmonary embolus, though it is difficult to be sure given limitations described above. 3. Small right pleural effusion with associated atelectasis. Critical Value/emergent results were called by telephone at the time of interpretation on 12/20/2018 at 10:23 pm to Dr. Conni Slipper , who verbally acknowledged these results. Electronically Signed   By: Ulyses Jarred M.D.   On: 12/20/2018 22:26   Dg Chest Portable 1 View  Result Date: 12/20/2018 CLINICAL DATA:  Pain and shortness of breath EXAM: PORTABLE CHEST 1 VIEW COMPARISON:  April 20, 2007 FINDINGS: No edema or consolidation. The heart size and pulmonary vascularity are normal. No adenopathy. No pneumothorax. No bone lesions. IMPRESSION: No edema or consolidation. Electronically Signed   By: Lowella Grip III M.D.   On: 12/20/2018 20:02   US Abdomen Limited Ruq  Result Date: 12/20/2018 CLINICAL DATA:  Right upper quadrant pain EXAM: ULTRASOUND ABDOMEN LIMITED RIGHT UPPER QUADRANT COMPARISON:  CT abdomen and pelvis November 04, 2010 FINDINGS: Gallbladder: No gallstones or wall thickening visualized. There is no pericholecystic fluid. No sonographic Murphy sign noted by sonographer. Common bile duct: Diameter: 4 mm. No intrahepatic or extrahepatic biliary duct dilatation. Liver: No  focal lesion identified. Within normal limits in parenchymal echogenicity. Portal vein is patent on color Doppler imaging with normal direction of blood flow towards the liver. There is a right pleural effusion. IMPRESSION: There is a right pleural effusion.  Study otherwise unremarkable. Electronically Signed   By: Lowella Grip III M.D.   On: 12/20/2018 20:30      IMPRESSION AND PLAN:   #1.  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  The patient will be admitted to a telemetry bed.  We will continue her on IV heparin and start her on p.o. Coumadin tonight.  She has been on Coumadin and for her previous PE.  She is not at this time on any oral contraceptive pills.  The etiology is unclear at this time.  We will obtain a 2D echo for further assessment for right ventricular strain, as well as bilateral lower extremity venous duplex to rule out DVT.  We will place her on PRN Toradol for pleuritic pain.  After resolution of her acute attack she may benefit in the next few weeks from checking hypercoagulability studies.  2.  Pseudotumor cerebri.  We will continue her Diamox.  3.  Hypothyroidism.  We will continue her Synthroid and check TSH.  4.  Depression.  We will continue her Celexa and Topamax.  5.  Dyslipidemia.  We will continue her statin therapy  6.  Hypertension.  We will continue her amlodipine.  7.  DVT prophylaxis.  This will be provided with continued IV heparin.   All the records are reviewed and case discussed with ED provider. The plan of care was discussed in details with the patient (and family). I answered all questions. The patient agreed to proceed with the above mentioned plan. Further management will depend upon hospital course.   CODE STATUS: Full code  TOTAL TIME TAKING CARE OF THIS PATIENT: 55 minutes.    Christel Mormon M.D on 12/21/2018 at 1:30 AM  Pager - [phone]  After 6pm go to [email or web address] - Proofreader  [facility]  Office  [phone]  CC: Primary care physician; Sharyne Peach, MD   Note: This dictation was prepared with Dragon dictation along with smaller phrase technology. Any transcriptional errors that result from this process are unintentional.

## 2018-12-21 NOTE — Progress Notes (Signed)
Admitted for chest pain and found to have acute pulmonary embolus on the right side, on heparin drip.  Patient told me that   she had history of pulmonary embolus long time back.   This morning she is having right-sided abdominal pain and nausea.  Denies any other complaints.  Lab work, imaging reviewed.  Ultrasound of abdomen showed right-sided pleural effusion but no other abnormalities.  #1 acute pulmonary embolus, patient on heparin, started on Coumadin, check ultrasound of the legs to evaluate for DVT. 2.  Right upper quadrant pain likely secondary to right-sided PE, echocardiogram did not show any right heart strain.  Per my discussion with the echo technician. 3.  Morbid obesity advised to lose weight 4.  Depression continue Lexapro.

## 2018-12-21 NOTE — Progress Notes (Addendum)
[place] at [place] NAME: Angela [name]    MR#:  [id number]  DATE OF BIRTH:  March 25, 1976  SUBJECTIVE: Admitted for right-sided chest pain, found to have right-sided PE.  Started on heparin drip.  Today morning complains of abdominal pain, nausea.  CHIEF COMPLAINT:   Chief Complaint  Patient presents with  . Shortness of Breath    REVIEW OF SYSTEMS:   ROS CONSTITUTIONAL: No fever, fatigue or weakness.  EYES: No blurred or double vision.  EARS, NOSE, AND THROAT: No tinnitus or ear pain.  RESPIRATORY: No cough, shortness of breath, wheezing or hemoptysis.  CARDIOVASCULAR: No chest pain or shortness of breath today.Marland Kitchen  GASTROINTESTINAL: Right-sided abdominal pain, nausea, vomiting. GENITOURINARY: No dysuria, hematuria.  ENDOCRINE: No polyuria, nocturia,  HEMATOLOGY: No anemia, easy bruising or bleeding SKIN: No rash or lesion. MUSCULOSKELETAL: No joint pain or arthritis.   NEUROLOGIC: No tingling, numbness, weakness.  PSYCHIATRY: No anxiety or depression.   DRUG ALLERGIES:   Allergies  Allergen Reactions  . Amoxicillin Hives  . Lisinopril Cough  . Nortriptyline   . Other     Sulfa eye drops- Scratched corneas  . Penicillins Hives  . Sulfa Antibiotics     VITALS:  Blood pressure 100/63, pulse 64, temperature 97.8 F (36.6 C), temperature source Oral, resp. rate 20, height 5\' 3"  (1.6 m), weight 130.6 kg, SpO2 97 %.  PHYSICAL EXAMINATION:  GENERAL:  43 y.o.-year-old patient lying in the bed with no acute distress.  EYES: Pupils equal, round, reactive to light and accommodation. No scleral icterus. Extraocular muscles intact.  HEENT: Head atraumatic, normocephalic. Oropharynx and nasopharynx clear.  NECK:  Supple, no jugular venous distention. No thyroid enlargement, no tenderness.  LUNGS: Normal breath sounds bilaterally, no wheezing, rales,rhonchi or crepitation. No use of accessory muscles of respiration.   CARDIOVASCULAR: S1, S2 normal. No murmurs, rubs, or gallops.  ABDOMEN: Slight abdominal pain on palpation but no rebound tenderness, bowel sounds present.  EXTREMITIES: No pedal edema, cyanosis, or clubbing.  NEUROLOGIC: Cranial nerves II through XII are intact. Muscle strength 5/5 in all extremities. Sensation intact. Gait not checked.  PSYCHIATRIC: The patient is alert and oriented x 3.  SKIN: No obvious rash, lesion, or ulcer.    LABORATORY PANEL:   CBC Recent Labs  Lab 12/21/18 0529  WBC 11.6*  HGB 12.5  HCT 37.6  PLT 376   ------------------------------------------------------------------------------------------------------------------  Chemistries  Recent Labs  Lab 12/20/18 1859 12/21/18 0529  NA 140 141  K 3.9 3.8  CL 114* 115*  CO2 21* 17*  GLUCOSE 89 102*  BUN 11 9  CREATININE 0.94 0.85  CALCIUM 8.5* 8.6*  AST 11*  --   ALT 9  --   ALKPHOS 53  --   BILITOT 0.3  --    ------------------------------------------------------------------------------------------------------------------  Cardiac Enzymes No results for input(s): TROPONINI in the last 168 hours. ------------------------------------------------------------------------------------------------------------------  RADIOLOGY:  Ct Angio Chest Pe W And/or Wo Contrast  Result Date: 12/20/2018 CLINICAL DATA:  Right upper quadrant pain with pleuritic chest pain. Positive D-dimer. EXAM: CT ANGIOGRAPHY CHEST WITH CONTRAST TECHNIQUE: Multidetector CT imaging of the chest was performed using the standard protocol during bolus administration of intravenous contrast. Multiplanar CT image reconstructions and MIPs were obtained to evaluate the vascular anatomy. CONTRAST:  33mL OMNIPAQUE IOHEXOL 350 MG/ML SOLN COMPARISON:  CTA chest 06/17/2005 FINDINGS: Cardiovascular: --Pulmonary arteries: Satisfactory contrast bolus. Beam attenuation caused by patient body habitus limits assessment beyond the lobar level.Within that  limitation, there is no central pulmonary embolus. There is, however, a wedge-shaped focus of peripheral consolidation within the right lower lobe. The adjacent segmental pulmonary artery branch appears relatively hypodense compared to other branches at the same level, probably indicating a small embolus (series 5 image 145). The main pulmonary artery is within normal limits for size. --Aorta: Limited opacification of the aorta due to bolus timing optimization for the pulmonary arteries. Conventional 3 vessel aortic branching pattern. The aortic course and caliber are normal. There is no aortic atherosclerosis. --Heart: Normal size. No pericardial effusion. Mediastinum/Nodes: No mediastinal, hilar or axillary lymphadenopathy. The visualized thyroid and thoracic esophageal course are unremarkable. Lungs/Pleura: Small right pleural effusion, with associated atelectasis. As above, there is a wedge-shaped focus of peripheral consolidation in the right lower lobe. Upper Abdomen: Contrast bolus timing is not optimized for evaluation of the abdominal organs. Within this limitation, the visualized organs of the upper abdomen are normal. Musculoskeletal: No chest wall abnormality. No acute or significant osseous findings. Review of the MIP images confirms the above findings. IMPRESSION: 1. Examination limited due to beam attenuation caused by patient body habitus, reducing sensitivity. 2. Wedge-shaped opacity in the right lower lobe lateral basal segment is most consistent with a pulmonary infarct. Relative hypoenhancement within the associated segmental artery probably represents a small pulmonary embolus, though it is difficult to be sure given limitations described above. 3. Small right pleural effusion with associated atelectasis. Critical Value/emergent results were called by telephone at the time of interpretation on 12/20/2018 at 10:23 pm to Dr. Conni Slipper , who verbally acknowledged these results. Electronically  Signed   By: Ulyses Jarred M.D.   On: 12/20/2018 22:26   Dg Chest Portable 1 View  Result Date: 12/20/2018 CLINICAL DATA:  Pain and shortness of breath EXAM: PORTABLE CHEST 1 VIEW COMPARISON:  April 20, 2007 FINDINGS: No edema or consolidation. The heart size and pulmonary vascularity are normal. No adenopathy. No pneumothorax. No bone lesions. IMPRESSION: No edema or consolidation. Electronically Signed   By: Lowella Grip III M.D.   On: 12/20/2018 20:02   US Abdomen Limited Ruq  Result Date: 12/20/2018 CLINICAL DATA:  Right upper quadrant pain EXAM: ULTRASOUND ABDOMEN LIMITED RIGHT UPPER QUADRANT COMPARISON:  CT abdomen and pelvis November 04, 2010 FINDINGS: Gallbladder: No gallstones or wall thickening visualized. There is no pericholecystic fluid. No sonographic Murphy sign noted by sonographer. Common bile duct: Diameter: 4 mm. No intrahepatic or extrahepatic biliary duct dilatation. Liver: No focal lesion identified. Within normal limits in parenchymal echogenicity. Portal vein is patent on color Doppler imaging with normal direction of blood flow towards the liver. There is a right pleural effusion. IMPRESSION: There is a right pleural effusion.  Study otherwise unremarkable. Electronically Signed   By: Lowella Grip III M.D.   On: 12/20/2018 20:30    EKG:   Orders placed or performed in visit on 06/17/05  . EKG 12-Lead    ASSESSMENT AND PLAN:   #1 acute right-sided chest pain secondary to acute pulmonary embolus on the right side, no hypoxia, on heparin drip.  Patient not on oxygen, echocardiogram did not show any right heart strain.  Continue heparin, started on Coumadin. 2.  Morbid obesity, acute PE, had history of for PE before was on anticoagulation but did not tell me what she was taking before.  Continue heparin, Coumadin, advised to lose weight. 3.  Patient can continue Lexapro. #4 right upper quadrant pain likely secondary to right-sided PE, ultrasound of abdomen is  nonspecific. Due to morbid obesity Eliquis may not be effective, continue with heparin, Coumadin.   More than 50% time spent in counseling, coordination of care.  All the records are reviewed and case discussed with Care Management/Social Workerr. Management plans discussed with the patient, family and they are in agreement.  CODE STATUS: Full code  TOTAL TIME TAKING CARE OF THIS PATIENT: 38 minutes.   POSSIBLE D/C IN 1-2 DAYS, DEPENDING ON CLINICAL CONDITION.   Epifanio Lesches M.D on 12/21/2018 at 1:28 PM  Between 7am to 6pm - Pager - [phone]  After 6pm go to [email or web address] - password [email or web address] [facility]  Office  [phone]  CC: Primary care physician; System, Pcp Not In   Note: This dictation was prepared with Dragon dictation along with smaller phrase technology. Any transcriptional errors that result from this process are unintentional.

## 2018-12-21 NOTE — Progress Notes (Signed)
*  PRELIMINARY RESULTS* Echocardiogram 2D Echocardiogram has been performed.  Sherrie Sport 12/21/2018, 9:29 AM

## 2018-12-21 NOTE — Progress Notes (Signed)
Ch visited w/ pt to see how well she was progressing. Pt was lying almost flat for comfort in the bed. Pt shared about her health challenges that she has had to endure for several years. Pt states she has been disabled since 2013. Pt also stated that she has family members that have long term health issues. Ch allowed time for the pt to lament and share about her pathway towards trying to stay healthy. Ch was concerned about pt considering weight loss surgery considering she is a high risk pt that has to take steroids. Pt feels hopeless in being able to be at a healthy weight while also receiving the right meds for a myriad of conditions that she has. Ch ministered and encouraged pt with scripture and affirmations that she can speak over herself and guided her to see that healing looks different for everyone. Pt understood and was appreciative of visit. Ch shared that she would f/u at a later time.    12/21/18 1300  Clinical Encounter Type  Visited With Patient  Visit Type Psychological support;Social support;Spiritual support  Spiritual Encounters  Spiritual Needs Emotional;Grief support;Literature;Sacred text  Stress Factors  Patient Stress Factors Exhausted;Health changes;Major life changes;Loss of control  Family Stress Factors Health changes     

## 2018-12-21 NOTE — Consult Note (Signed)
ANTICOAGULATION CONSULT NOTE - Initial Consult  Pharmacy Consult for Heparin Drip dosing and monitoring  Indication: pulmonary embolus/Pulmonary infarct   Allergies  Allergen Reactions  . Amoxicillin Hives  . Lisinopril Cough  . Nortriptyline   . Other     Sulfa eye drops- Scratched corneas  . Penicillins Hives  . Sulfa Antibiotics     Patient Measurements: Height: 5\' 3"  (160 cm) Weight: 288 lb (130.6 kg) IBW/kg (Calculated) : 52.4 Heparin Dosing Weight: 85kg  Vital Signs: Temp: 98.1 F (36.7 C) (05/13 0553) Temp Source: Oral (05/13 0553) BP: 107/78 (05/13 0553) Pulse Rate: 59 (05/13 0553)  Labs: Recent Labs    12/20/18 1859 12/20/18 2325 12/21/18 0529  HGB 12.4  --  12.5  HCT 37.9  --  37.6  PLT 397  --  376  APTT  --  40*  --   LABPROT  --  14.1 14.2  INR  --  1.1 1.1  HEPARINUNFRC  --   --  0.54  CREATININE 0.94  --   --     Estimated Creatinine Clearance: 102 mL/min (by C-G formula based on SCr of 0.94 mg/dL).   Medical History: Past Medical History:  Diagnosis Date  . Anemia   . Anxiety   . Depression   . Fibromyalgia   . GERD (gastroesophageal reflux disease)   . Hypertension   . Irritable bowel syndrome (IBS)   . Pseudotumor cerebri   . Thyroid disease    hypothyroid  . Vision disturbance     Assessment: Pharmacy consulted for heparin drip dosing and monitoring for 43 yo female admitted with pulmonary embolus/Pulmonary infarct.   5/12 Heparin infusion started @ 1500 units/hr   Goal of Therapy:  Heparin level 0.3-0.7 units/ml Monitor platelets by anticoagulation protocol: Yes   Plan:  Dosing weight: 85 kg  5/13 @ 0529 0.54. Level is therapeutic x1. Will continue current infusion rate of 1500 units/hr.  Will recheck confirmatory level in 6 hours.  Check anti-Xa level and CBCs daily while on heparin Continue to monitor H&H and platelets  Pernell Dupre, PharmD, BCPS Clinical Pharmacist 12/21/2018 6:01 AM

## 2018-12-21 NOTE — Progress Notes (Signed)
Notified Dr. Vianne Bulls about patient's complaints of 6/10 LLQ abdominal sharp pain, non radiating, is worst when patient is sitting up. She had a RUQ abdominal US yesterday showing pleural effusion, asked if needed more imaging, no order given per MD. Also patient has not have BM since Sunday, will try milk of mg after dinner. No other concern at the moment. RN will continue to monitor.

## 2018-12-21 NOTE — Progress Notes (Signed)
PHARMACIST - PHYSICIAN ORDER COMMUNICATION  CONCERNING: P&T Medication Policy on Herbal Medications  DESCRIPTION:  This patient's order for:  Biotin 1 mg has been noted.  This product(s) is classified as an "herbal" or natural product. Due to a lack of definitive safety studies or FDA approval, nonstandard manufacturing practices, plus the potential risk of unknown drug-drug interactions while on inpatient medications, the Pharmacy and Therapeutics Committee does not permit the use of "herbal" or natural products of this type within [facility].   ACTION TAKEN: The pharmacy department is unable to verify this order at this time.  Please reevaluate patient's clinical condition at discharge and address if the herbal or natural product(s) should be resumed at that time.  Pernell Dupre, PharmD, BCPS Clinical Pharmacist 12/21/2018 1:23 AM

## 2018-12-22 LAB — HEPARIN LEVEL (UNFRACTIONATED)
Heparin Unfractionated: 0.7 IU/mL (ref 0.30–0.70)
Heparin Unfractionated: 0.7 IU/mL (ref 0.30–0.70)
Heparin Unfractionated: 0.45 IU/mL (ref 0.30–0.70)

## 2018-12-22 LAB — PROTIME-INR
INR: 1.1 (ref 0.8–1.2)
Prothrombin Time: 14.5 seconds (ref 11.4–15.2)

## 2018-12-22 MED ORDER — WARFARIN SODIUM 10 MG PO TABS
10.0000 mg | ORAL_TABLET | Freq: Once | ORAL | Status: AC
  Administered 2018-12-22: 10 mg via ORAL
  Filled 2018-12-22: qty 1

## 2018-12-22 MED ORDER — AMLODIPINE BESYLATE 5 MG PO TABS
5.0000 mg | ORAL_TABLET | Freq: Every day | ORAL | Status: DC
  Filled 2018-12-22: qty 1

## 2018-12-22 NOTE — Consult Note (Signed)
ANTICOAGULATION CONSULT NOTE   Pharmacy Consult for Heparin and Warfarin Indication: pulmonary embolus/Pulmonary infarct   Patient Measurements: Height: 5\' 3"  (160 cm) Weight: 291 lb 11.2 oz (132.3 kg) IBW/kg (Calculated) : 52.4 Heparin Dosing Weight: 85kg  Vital Signs: Temp: 97.8 F (36.6 C) (05/14 0732) Temp Source: Oral (05/14 0732) BP: 95/46 (05/14 0732) Pulse Rate: 61 (05/14 0732)  Labs: Recent Labs    12/20/18 1859 12/20/18 2325  12/21/18 0529 12/21/18 1240 12/22/18 0526 12/22/18 1227  HGB 12.4  --   --  12.5  --   --   --   HCT 37.9  --   --  37.6  --   --   --   PLT 397  --   --  376  --   --   --   APTT  --  40*  --   --   --   --   --   LABPROT  --  14.1  --  14.2  --  14.5  --   INR  --  1.1  --  1.1  --  1.1  --   HEPARINUNFRC  --   --    < > 0.54 0.62 0.70 0.70  CREATININE 0.94  --   --  0.85  --   --   --    < > = values in this interval not displayed.    Estimated Creatinine Clearance: 113.7 mL/min (by C-G formula based on SCr of 0.85 mg/dL).   Medical History: Past Medical History:  Diagnosis Date  . Anemia   . Anxiety   . Depression   . Fibromyalgia   . GERD (gastroesophageal reflux disease)   . Hypertension   . Irritable bowel syndrome (IBS)   . Pseudotumor cerebri   . Thyroid disease    hypothyroid  . Vision disturbance     Assessment: Pharmacy consulted for heparin drip dosing and monitoring for 43 yo female admitted with pulmonary embolus/Pulmonary infarct. Started on warfarin. Per notes pt was on warfarin previously for past PE/DVT. DOAC would not be recommended in this patient as the ISTH recommend to avoid the use of DOAC in patient's with a BMI > 40. BMI 51. Warfarin would be the best anticoagulation agent for this patient with the current data.Given patient recently started warfarin and d/t obesity, patient may require a less conservative amount of warfarin to become therapeutic.   Heparin Course: 5/12 Heparin infusion started at  1500 units/hr  5/13 0529 HL 0.54  5/13 1240 HL 0.62  5/14 0526 HL 0.70: rate decreased to 1450 units/hr 5/14 1227 HL 0.70: rate decreased to 1400 units/hr 5/14 2127 HL 0.45: continue at 1400 units/hr  Drug Interactions:  Ketorolac -- increase risk bleeding  Levothyroxine -- hypoprothrombinemic effect may be increased   Date INR Warfarin Dose  5/12 1.1   5/13 1.1 5mg  0300 + 5mg  1800  5/14 1.1 10mg     Goal of Therapy:  Heparin level 0.3-0.7 units/ml INR level 2-3 Monitor platelets by anticoagulation protocol: Yes   Plan:  --heparin level therapeutic: continue at 1400 units/hr --recheck heparin level in 6 hours to confirm --CBC in am  Warfarin: Given INR continues to remain subtherapeutic, will give patient 10 mg @ 1800. Daily INR ordered. Pharmacy will continue to monitor H&H and platelets and follow.    Dallie Piles, PharmD Clinical Pharmacist 12/22/2018 2:08 PM

## 2018-12-22 NOTE — Consult Note (Signed)
[facility] for Heparin and Warfarin Indication: pulmonary embolus/Pulmonary infarct   Allergies  Allergen Reactions  . Amoxicillin Hives  . Lisinopril Cough  . Nortriptyline   . Other     Sulfa eye drops- Scratched corneas  . Penicillins Hives  . Sulfa Antibiotics     Patient Measurements: Height: 5\' 3"  (160 cm) Weight: 291 lb 11.2 oz (132.3 kg) IBW/kg (Calculated) : 52.4 Heparin Dosing Weight: 85kg  Vital Signs: Temp: 98.6 F (37 C) (05/14 0504) Temp Source: Oral (05/14 0504) BP: 104/70 (05/14 0504) Pulse Rate: 65 (05/14 0504)  Labs: Recent Labs    12/20/18 1859 12/20/18 2325 12/21/18 0529 12/21/18 1240 12/22/18 0526  HGB 12.4  --  12.5  --   --   HCT 37.9  --  37.6  --   --   PLT 397  --  376  --   --   APTT  --  40*  --   --   --   LABPROT  --  14.1 14.2  --  14.5  INR  --  1.1 1.1  --  1.1  HEPARINUNFRC  --   --  0.54 0.62 0.70  CREATININE 0.94  --  0.85  --   --     Estimated Creatinine Clearance: 113.7 mL/min (by C-G formula based on SCr of 0.85 mg/dL).   Medical History: Past Medical History:  Diagnosis Date  . Anemia   . Anxiety   . Depression   . Fibromyalgia   . GERD (gastroesophageal reflux disease)   . Hypertension   . Irritable bowel syndrome (IBS)   . Pseudotumor cerebri   . Thyroid disease    hypothyroid  . Vision disturbance     Assessment: Pharmacy consulted for heparin drip dosing and monitoring for 43 yo female admitted with pulmonary embolus/Pulmonary infarct. Started on warfarin. Per notes pt was on warfarin previously for past PE/DVT. DOAC would not be recommended in this patient as the ISTH recommend to avoid the use of DOAC in patient's with a BMI > 40. BMI 51. Warfarin would be the best anticoagulation agent for this patient with the current data.Given patient recently started warfarin and d/t obesity, patient may require a less conservative amount of warfarin to become therapeutic.    5/12 Heparin infusion started @ 1500 units/hr  5/13 @0529  HL 0.54  5/13 @0 .62 HL 0.62    Drug Interactions:  Ketorolac -- increase risk bleeding  Levothyroxine -- hypoprothrombinemic effect may be increased   Date INR Warfarin Dose  5/12 1.1   5/13 1.1 5 mg @0300  + 5 mg 1800.    5/14 1.1         Goal of Therapy:  Heparin level 0.3-0.7 units/ml INR level 2-3 Monitor platelets by anticoagulation protocol: Yes   Plan:   Heparin: Dosing weight: 85 kg  5/14 @ 0526 HL 0.70. Level is at the high end of therapeutic and has been trending up. Will reduce heparin infusion to 1450 and recheck confirmatory level in 6 hours. Continue to check anti-Xa level and CBCs daily while on heparin.  Warfarin: Given INR continues to remain subtherapeutic, will give patient 10 mg @ 1800. Daily INR ordered. Pharmacy will continue to monitor H&H and platelets and follow.    Rowland Lathe, PharmD Clinical Pharmacist 12/22/2018 7:21 AM

## 2018-12-22 NOTE — Consult Note (Signed)
[facility] for Heparin and Warfarin Indication: pulmonary embolus/Pulmonary infarct   Allergies  Allergen Reactions  . Amoxicillin Hives  . Lisinopril Cough  . Nortriptyline   . Other     Sulfa eye drops- Scratched corneas  . Penicillins Hives  . Sulfa Antibiotics     Patient Measurements: Height: 5\' 3"  (160 cm) Weight: 291 lb 11.2 oz (132.3 kg) IBW/kg (Calculated) : 52.4 Heparin Dosing Weight: 85kg  Vital Signs: Temp: 97.8 F (36.6 C) (05/14 0732) Temp Source: Oral (05/14 0732) BP: 95/46 (05/14 0732) Pulse Rate: 61 (05/14 0732)  Labs: Recent Labs    12/20/18 1859 12/20/18 2325  12/21/18 0529 12/21/18 1240 12/22/18 0526 12/22/18 1227  HGB 12.4  --   --  12.5  --   --   --   HCT 37.9  --   --  37.6  --   --   --   PLT 397  --   --  376  --   --   --   APTT  --  40*  --   --   --   --   --   LABPROT  --  14.1  --  14.2  --  14.5  --   INR  --  1.1  --  1.1  --  1.1  --   HEPARINUNFRC  --   --    < > 0.54 0.62 0.70 0.70  CREATININE 0.94  --   --  0.85  --   --   --    < > = values in this interval not displayed.    Estimated Creatinine Clearance: 113.7 mL/min (by C-G formula based on SCr of 0.85 mg/dL).   Medical History: Past Medical History:  Diagnosis Date  . Anemia   . Anxiety   . Depression   . Fibromyalgia   . GERD (gastroesophageal reflux disease)   . Hypertension   . Irritable bowel syndrome (IBS)   . Pseudotumor cerebri   . Thyroid disease    hypothyroid  . Vision disturbance     Assessment: Pharmacy consulted for heparin drip dosing and monitoring for 43 yo female admitted with pulmonary embolus/Pulmonary infarct. Started on warfarin. Per notes pt was on warfarin previously for past PE/DVT. DOAC would not be recommended in this patient as the ISTH recommend to avoid the use of DOAC in patient's with a BMI > 40. BMI 51. Warfarin would be the best anticoagulation agent for this patient with the current  data.Given patient recently started warfarin and d/t obesity, patient may require a less conservative amount of warfarin to become therapeutic.   5/12 Heparin infusion started @ 1500 units/hr  5/13 @0529  HL 0.54  5/13 @0 .62 HL 0.62    Drug Interactions:  Ketorolac -- increase risk bleeding  Levothyroxine -- hypoprothrombinemic effect may be increased   Date INR Warfarin Dose  5/12 1.1   5/13 1.1 5 mg @0300  + 5 mg 1800.    5/14 1.1          Goal of Therapy:  Heparin level 0.3-0.7 units/ml INR level 2-3 Monitor platelets by anticoagulation protocol: Yes   Plan:   Heparin: Dosing weight: 85 kg  5/14 @ 0526 HL 0.70. Level is at the high end of therapeutic and has been trending up. Will reduce heparin infusion to 1450 and recheck confirmatory level in 6 hours. Continue to check anti-Xa level and CBCs daily while on heparin.  5/14 @  12:27: 0.70. Level is therapeutic, yet on the higher end s/p dose reduction. Had the dose not be reduced, her HL could have been higher. Will reduce heparin infusion to 1400 units and recheck confirmatory level in 6 hours. Continue to check anti-Xa level and CBCs daily.   Warfarin: Given INR continues to remain subtherapeutic, will give patient 10 mg @ 1800. Daily INR ordered. Pharmacy will continue to monitor H&H and platelets and follow.    Rowland Lathe, PharmD Clinical Pharmacist 12/22/2018 1:39 PM

## 2018-12-22 NOTE — Progress Notes (Signed)
[facility] NAME: Angela [name]    MR#:  [id number]  DATE OF BIRTH:  1976-03-21  SUBJECTIVE: complaints of right and left upper quadrant abdominal pain and feels uncomfortable with certain positions.  No shortness of breath.  CHIEF COMPLAINT:   Chief Complaint  Patient presents with  . Shortness of Breath    REVIEW OF SYSTEMS:   ROS CONSTITUTIONAL: No fever, fatigue or weakness.  EYES: No blurred or double vision.  EARS, NOSE, AND THROAT: No tinnitus or ear pain.  RESPIRATORY: No cough, shortness of breath, wheezing or hemoptysis.  CARDIOVASCULAR: No chest pain or shortness of breath today.Marland Kitchen  GASTROINTESTINAL: Right-sided abdominal pain, nausea, vomiting. GENITOURINARY: No dysuria, hematuria.  ENDOCRINE: No polyuria, nocturia,  HEMATOLOGY: No anemia, easy bruising or bleeding SKIN: No rash or lesion. MUSCULOSKELETAL: No joint pain or arthritis.   NEUROLOGIC: No tingling, numbness, weakness.  PSYCHIATRY: No anxiety or depression.   DRUG ALLERGIES:   Allergies  Allergen Reactions  . Amoxicillin Hives  . Lisinopril Cough  . Nortriptyline   . Other     Sulfa eye drops- Scratched corneas  . Penicillins Hives  . Sulfa Antibiotics     VITALS:  Blood pressure (!) 95/46, pulse 61, temperature 97.8 F (36.6 C), temperature source Oral, resp. rate 18, height 5\' 3"  (1.6 m), weight 132.3 kg, SpO2 97 %.  PHYSICAL EXAMINATION:  GENERAL:  43 y.o.-year-old patient lying in the bed with no acute distress.  EYES: Pupils equal, round, reactive to light and accommodation. No scleral icterus. Extraocular muscles intact.  HEENT: Head atraumatic, normocephalic. Oropharynx and nasopharynx clear.  NECK:  Supple, no jugular venous distention. No thyroid enlargement, no tenderness.  LUNGS: Normal breath sounds bilaterally, no wheezing, rales,rhonchi or crepitation. No use of accessory muscles of respiration.  CARDIOVASCULAR: S1,  S2 normal. No murmurs, rubs, or gallops.  ABDOMEN: Slight abdominal pain on palpation but no rebound tenderness, bowel sounds present.  EXTREMITIES: No pedal edema, cyanosis, or clubbing.  NEUROLOGIC: Cranial nerves II through XII are intact. Muscle strength 5/5 in all extremities. Sensation intact. Gait not checked.  PSYCHIATRIC: The patient is alert and oriented x 3.  SKIN: No obvious rash, lesion, or ulcer.    LABORATORY PANEL:   CBC Recent Labs  Lab 12/21/18 0529  WBC 11.6*  HGB 12.5  HCT 37.6  PLT 376   ------------------------------------------------------------------------------------------------------------------  Chemistries  Recent Labs  Lab 12/20/18 1859 12/21/18 0529  NA 140 141  K 3.9 3.8  CL 114* 115*  CO2 21* 17*  GLUCOSE 89 102*  BUN 11 9  CREATININE 0.94 0.85  CALCIUM 8.5* 8.6*  AST 11*  --   ALT 9  --   ALKPHOS 53  --   BILITOT 0.3  --    ------------------------------------------------------------------------------------------------------------------  Cardiac Enzymes No results for input(s): TROPONINI in the last 168 hours. ------------------------------------------------------------------------------------------------------------------  RADIOLOGY:  Ct Angio Chest Pe W And/or Wo Contrast  Result Date: 12/20/2018 CLINICAL DATA:  Right upper quadrant pain with pleuritic chest pain. Positive D-dimer. EXAM: CT ANGIOGRAPHY CHEST WITH CONTRAST TECHNIQUE: Multidetector CT imaging of the chest was performed using the standard protocol during bolus administration of intravenous contrast. Multiplanar CT image reconstructions and MIPs were obtained to evaluate the vascular anatomy. CONTRAST:  78mL OMNIPAQUE IOHEXOL 350 MG/ML SOLN COMPARISON:  CTA chest 06/17/2005 FINDINGS: Cardiovascular: --Pulmonary arteries: Satisfactory contrast bolus. Beam attenuation caused by patient body habitus limits assessment beyond the lobar level.Within that limitation, there  is no  central pulmonary embolus. There is, however, a wedge-shaped focus of peripheral consolidation within the right lower lobe. The adjacent segmental pulmonary artery branch appears relatively hypodense compared to other branches at the same level, probably indicating a small embolus (series 5 image 145). The main pulmonary artery is within normal limits for size. --Aorta: Limited opacification of the aorta due to bolus timing optimization for the pulmonary arteries. Conventional 3 vessel aortic branching pattern. The aortic course and caliber are normal. There is no aortic atherosclerosis. --Heart: Normal size. No pericardial effusion. Mediastinum/Nodes: No mediastinal, hilar or axillary lymphadenopathy. The visualized thyroid and thoracic esophageal course are unremarkable. Lungs/Pleura: Small right pleural effusion, with associated atelectasis. As above, there is a wedge-shaped focus of peripheral consolidation in the right lower lobe. Upper Abdomen: Contrast bolus timing is not optimized for evaluation of the abdominal organs. Within this limitation, the visualized organs of the upper abdomen are normal. Musculoskeletal: No chest wall abnormality. No acute or significant osseous findings. Review of the MIP images confirms the above findings. IMPRESSION: 1. Examination limited due to beam attenuation caused by patient body habitus, reducing sensitivity. 2. Wedge-shaped opacity in the right lower lobe lateral basal segment is most consistent with a pulmonary infarct. Relative hypoenhancement within the associated segmental artery probably represents a small pulmonary embolus, though it is difficult to be sure given limitations described above. 3. Small right pleural effusion with associated atelectasis. Critical Value/emergent results were called by telephone at the time of interpretation on 12/20/2018 at 10:23 pm to Dr. Conni Slipper , who verbally acknowledged these results. Electronically Signed   By: Ulyses Jarred  M.D.   On: 12/20/2018 22:26   US Venous Img Lower Bilateral  Result Date: 12/21/2018 CLINICAL DATA:  Pulmonary emboli, shortness of breath EXAM: BILATERAL LOWER EXTREMITY VENOUS DOPPLER ULTRASOUND TECHNIQUE: Gray-scale sonography with compression, as well as color and duplex ultrasound, were performed to evaluate the deep venous system from the level of the common femoral vein through the popliteal and proximal calf veins. COMPARISON:  03/18/2012 FINDINGS: Normal compressibility of the common femoral, superficial femoral, and popliteal veins, as well as the proximal calf veins. No filling defects to suggest DVT on grayscale or color Doppler imaging. Doppler waveforms show normal direction of venous flow, normal respiratory phasicity and response to augmentation. IMPRESSION: No femoropopliteal and no calf DVT in the visualized calf veins. If clinical symptoms are inconsistent or if there are persistent or worsening symptoms, further imaging (possibly involving the iliac veins) may be warranted. Electronically Signed   By: Lucrezia Europe M.D.   On: 12/21/2018 15:44   Dg Chest Portable 1 View  Result Date: 12/20/2018 CLINICAL DATA:  Pain and shortness of breath EXAM: PORTABLE CHEST 1 VIEW COMPARISON:  April 20, 2007 FINDINGS: No edema or consolidation. The heart size and pulmonary vascularity are normal. No adenopathy. No pneumothorax. No bone lesions. IMPRESSION: No edema or consolidation. Electronically Signed   By: Lowella Grip III M.D.   On: 12/20/2018 20:02   US Abdomen Limited Ruq  Result Date: 12/20/2018 CLINICAL DATA:  Right upper quadrant pain EXAM: ULTRASOUND ABDOMEN LIMITED RIGHT UPPER QUADRANT COMPARISON:  CT abdomen and pelvis November 04, 2010 FINDINGS: Gallbladder: No gallstones or wall thickening visualized. There is no pericholecystic fluid. No sonographic Murphy sign noted by sonographer. Common bile duct: Diameter: 4 mm. No intrahepatic or extrahepatic biliary duct dilatation. Liver: No  focal lesion identified. Within normal limits in parenchymal echogenicity. Portal vein is patent on color Doppler  imaging with normal direction of blood flow towards the liver. There is a right pleural effusion. IMPRESSION: There is a right pleural effusion.  Study otherwise unremarkable. Electronically Signed   By: Lowella Grip III M.D.   On: 12/20/2018 20:30    EKG:   Orders placed or performed in visit on 06/17/05  . EKG 12-Lead    ASSESSMENT AND PLAN:   #1. acute right-sided chest pain secondary to acute pulmonary embolus on the right side, no hypoxia, on heparin drip.  Patient not on oxygen, echocardiogram did not show any right heart strain.  Continue heparin, started on Coumadin. 2.  Morbid obesity, acute PE, had history of for PE before was on anticoagulation patient told me that she was taking Coumadin before.  History of work-up including antiphospholipid antibody and lupus anticoagulant and reportedly were all negative.  We will repeat the work-up again this time.   3.  Patient can continue Lexapro. #4 right upper quadrant pain likely secondary to right-sided PE, ultrasound of abdomen is nonspecific. Due to morbid obesity Eliquis may not be effective, continue with heparin, Coumadin. \ We will see if she can get education about Lovenox shots, plan to discharge her with Lovenox, Coumadin.  Will send work-up for hypercoagulable state today.  More than 50% time spent in counseling, coordination of care.  All the records are reviewed and case discussed with Care Management/Social Workerr. Management plans discussed with the patient, family and they are in agreement.  CODE STATUS: Full code  TOTAL TIME TAKING CARE OF THIS PATIENT: 38 minutes.   POSSIBLE D/C IN 1-2 DAYS, DEPENDING ON CLINICAL CONDITION.   Epifanio Lesches M.D on 12/22/2018 at 11:18 AM  Between 7am to 6pm - Pager - [phone]  After 6pm go to [email or web address] - password [email or web address] [place]  [facility]  Office  [phone]  CC: Primary care physician; System, Pcp Not In   Note: This dictation was prepared with Dragon dictation along with smaller phrase technology. Any transcriptional errors that result from this process are unintentional.

## 2018-12-22 NOTE — Consult Note (Signed)
[place] for Heparin and Warfarin Indication: pulmonary embolus/Pulmonary infarct   Allergies  Allergen Reactions  . Amoxicillin Hives  . Lisinopril Cough  . Nortriptyline   . Other     Sulfa eye drops- Scratched corneas  . Penicillins Hives  . Sulfa Antibiotics     Patient Measurements: Height: 5\' 3"  (160 cm) Weight: 291 lb 11.2 oz (132.3 kg) IBW/kg (Calculated) : 52.4 Heparin Dosing Weight: 85kg  Vital Signs: Temp: 98.6 F (37 C) (05/14 0504) Temp Source: Oral (05/14 0504) BP: 104/70 (05/14 0504) Pulse Rate: 65 (05/14 0504)  Labs: Recent Labs    12/20/18 1859 12/20/18 2325 12/21/18 0529 12/21/18 1240 12/22/18 0526  HGB 12.4  --  12.5  --   --   HCT 37.9  --  37.6  --   --   PLT 397  --  376  --   --   APTT  --  40*  --   --   --   LABPROT  --  14.1 14.2  --  14.5  INR  --  1.1 1.1  --  1.1  HEPARINUNFRC  --   --  0.54 0.62 0.70  CREATININE 0.94  --  0.85  --   --     Estimated Creatinine Clearance: 113.7 mL/min (by C-G formula based on SCr of 0.85 mg/dL).   Medical History: Past Medical History:  Diagnosis Date  . Anemia   . Anxiety   . Depression   . Fibromyalgia   . GERD (gastroesophageal reflux disease)   . Hypertension   . Irritable bowel syndrome (IBS)   . Pseudotumor cerebri   . Thyroid disease    hypothyroid  . Vision disturbance     Assessment: Pharmacy consulted for heparin drip dosing and monitoring for 44 yo female admitted with pulmonary embolus/Pulmonary infarct. Started on warfarin. Per notes pt was on warfarin previously for past PE/DVT. DOAC would not be recommended in this patient as the ISTH recommend to avoid the use of DOAC in patient's with a BMI > 40. BMI 51. Warfarin would be the best anticoagulation agent for this patient with the current data.   5/12 Heparin infusion started @ 1500 units/hr  5/13 @0529  HL 0.54  5/13 @0 .62 HL 0.62    Date INR Warfarin Dose  5/12 1.1   5/13 1.1  5 mg @0300  + 5 mg 1800.     Goal of Therapy:  Heparin level 0.3-0.7 units/ml Monitor platelets by anticoagulation protocol: Yes   Plan:  Dosing weight: 85 kg  5/14 @ 0526 HL 0.70. Level is at the high end of therapeutic and has been trending up. Will reduce heparin infusion to 1450 and recheck confirmatory level in 6 hours. Continue to check anti-Xa level and CBCs daily while on heparin.  Daily INR ordered. Pharmacy will continue to monitor H&H and platelets and follow.   Pernell Dupre, PharmD, BCPS Clinical Pharmacist 12/22/2018 6:06 AM

## 2018-12-22 NOTE — Progress Notes (Signed)
She reports a remote h/o prior use of warfarin.   Patient was counseled on:  - The purpose of a blood thinner - Sign/symptoms of a VTE - How/when to take warfarin - Drug interactions that may effect the therapeutic level of warfarin.  - Food/lifestyle choices that may effect the therapeutic level of warfarin.  -Complications of bleeding/bruising and when to seek medical attention   Patient was provided a handout on warfarin, listing the above information.   Thank you for allowing pharmacy to be a part of this patient's care.  Kristeen Miss, PharmD Clinical Pharmacist

## 2018-12-23 LAB — COMPREHENSIVE METABOLIC PANEL
ALT: 8 U/L (ref 0–44)
AST: 10 U/L — ABNORMAL LOW (ref 15–41)
Albumin: 2.7 g/dL — ABNORMAL LOW (ref 3.5–5.0)
Alkaline Phosphatase: 40 U/L (ref 38–126)
Anion gap: 5 (ref 5–15)
BUN: 14 mg/dL (ref 6–20)
CO2: 16 mmol/L — ABNORMAL LOW (ref 22–32)
Calcium: 7.6 mg/dL — ABNORMAL LOW (ref 8.9–10.3)
Chloride: 115 mmol/L — ABNORMAL HIGH (ref 98–111)
Creatinine, Ser: 0.95 mg/dL (ref 0.44–1.00)
GFR calc Af Amer: >60 mL/min (ref >60)
GFR calc non Af Amer: >60 mL/min (ref >60)
Glucose, Bld: 89 mg/dL (ref 70–99)
Potassium: 3.9 mmol/L (ref 3.5–5.1)
Sodium: 136 mmol/L (ref 135–145)
Total Bilirubin: 0.2 mg/dL — ABNORMAL LOW (ref 0.3–1.2)
Total Protein: 5.5 g/dL — ABNORMAL LOW (ref 6.5–8.1)

## 2018-12-23 LAB — HEPARIN LEVEL (UNFRACTIONATED): Heparin Unfractionated: 0.5 IU/mL (ref 0.30–0.70)

## 2018-12-23 LAB — TROPONIN I: Troponin I: <0.03 ng/mL (ref <0.03)

## 2018-12-23 LAB — CBC
HCT: 33.5 % — ABNORMAL LOW (ref 36.0–46.0)
Hemoglobin: 10.7 g/dL — ABNORMAL LOW (ref 12.0–15.0)
MCH: 27.9 pg (ref 26.0–34.0)
MCHC: 31.9 g/dL (ref 30.0–36.0)
MCV: 87.5 fL (ref 80.0–100.0)
Platelets: 359 10*3/uL (ref 150–400)
RBC: 3.83 MIL/uL — ABNORMAL LOW (ref 3.87–5.11)
RDW: 13.3 % (ref 11.5–15.5)
WBC: 9 10*3/uL (ref 4.0–10.5)
nRBC: 0 % (ref 0.0–0.2)

## 2018-12-23 LAB — MAGNESIUM: Magnesium: 2.2 mg/dL (ref 1.7–2.4)

## 2018-12-23 LAB — PROTIME-INR
INR: 1.3 — ABNORMAL HIGH (ref 0.8–1.2)
Prothrombin Time: 15.8 seconds — ABNORMAL HIGH (ref 11.4–15.2)

## 2018-12-23 MED ORDER — ENOXAPARIN SODIUM 150 MG/ML ~~LOC~~ SOLN: 1.0000 mg/kg | Freq: Two times a day (BID) | SUBCUTANEOUS | 0 refills | Status: DC

## 2018-12-23 MED ORDER — GUAIFENESIN ER 600 MG PO TB12
600.0000 mg | ORAL_TABLET | Freq: Two times a day (BID) | ORAL | Status: DC
  Administered 2018-12-23: 600 mg via ORAL
  Filled 2018-12-23: qty 1

## 2018-12-23 MED ORDER — ENOXAPARIN SODIUM 150 MG/ML ~~LOC~~ SOLN
1.0000 mg/kg | Freq: Two times a day (BID) | SUBCUTANEOUS | Status: DC
  Administered 2018-12-23: 135 mg via SUBCUTANEOUS
  Filled 2018-12-23 (×2): qty 0.89

## 2018-12-23 MED ORDER — SODIUM CHLORIDE 0.9 % IV BOLUS
500.0000 mL | Freq: Once | INTRAVENOUS | Status: AC
  Administered 2018-12-23: 500 mL via INTRAVENOUS

## 2018-12-23 MED ORDER — WARFARIN SODIUM 7.5 MG PO TABS: 7.5000 mg | ORAL_TABLET | Freq: Every day | ORAL | 11 refills | Status: DC

## 2018-12-23 MED ORDER — WARFARIN SODIUM 10 MG PO TABS
10.0000 mg | ORAL_TABLET | Freq: Once | ORAL | Status: DC
  Filled 2018-12-23: qty 1

## 2018-12-23 NOTE — Consult Note (Addendum)
[facility] for Warfarin Indication: pulmonary embolus/Pulmonary infarct   Allergies  Allergen Reactions  . Amoxicillin Hives  . Lisinopril Cough  . Nortriptyline   . Other     Sulfa eye drops- Scratched corneas  . Penicillins Hives  . Sulfa Antibiotics     Patient Measurements: Height: 5\' 3"  (160 cm) Weight: 295 lb 9.6 oz (134.1 kg) IBW/kg (Calculated) : 52.4 Heparin Dosing Weight: 85kg  Vital Signs: Temp: 97.8 F (36.6 C) (05/15 0732) Temp Source: Oral (05/15 0732) BP: 112/67 (05/15 0732) Pulse Rate: 59 (05/15 0732)  Labs: Recent Labs    12/20/18 1859  12/20/18 2325 12/21/18 0529  12/22/18 0526 12/22/18 1227 12/22/18 2127 12/23/18 0107 12/23/18 0529  HGB 12.4  --   --  12.5  --   --   --   --   --  10.7*  HCT 37.9  --   --  37.6  --   --   --   --   --  33.5*  PLT 397  --   --  376  --   --   --   --   --  359  APTT  --   --  40*  --   --   --   --   --   --   --   LABPROT  --    < > 14.1 14.2  --  14.5  --   --   --  15.8*  INR  --    < > 1.1 1.1  --  1.1  --   --   --  1.3*  HEPARINUNFRC  --   --   --  0.54   < > 0.70 0.70 0.45  --  0.50  CREATININE 0.94  --   --  0.85  --   --   --   --  0.95  --   TROPONINI  --   --   --   --   --   --   --   --  <0.03  --    < > = values in this interval not displayed.    Estimated Creatinine Clearance: 102.6 mL/min (by C-G formula based on SCr of 0.95 mg/dL).    Assessment: Pharmacy consulted for heparin drip dosing and monitoring for 43 yo female admitted with pulmonary embolus/Pulmonary infarct. Started on warfarin. Per notes pt was on warfarin previously for past PE/DVT. DOAC would not be recommended in this patient as the ISTH recommend to avoid the use of DOAC in patient's with a BMI > 40. BMI 51. Warfarin would be the best anticoagulation agent for this patient with the current data.Given patient recently started warfarin and d/t obesity, patient may require a less conservative  amount of warfarin to become therapeutic.   Pt switched from heparin drip to enoxaparin on 5/15.    Drug Interactions:  Ketorolac -- increase risk bleeding  Levothyroxine -- hypoprothrombinemic effect may be increased   Date INR Warfarin Dose  5/12 1.1   5/13 1.1 5 mg @0300  + 5 mg 1800.    5/14 1.1 10 mg  5/15 1.3     Goal of Therapy:  INR level 2-3 Monitor platelets by anticoagulation protocol: Yes   Plan:  Warfarin: INR remains subtherapeutic but starting to trend up, will give patient warfarin 10 mg @ 1800. Daily INR ordered. Pharmacy will continue to monitor H&H and platelets and follow.   Pt  will need 5 days of bridging and INR >2 x2 days before parenteral anticoagulation discontinued.    Rocky Morel, PharmD, BCPS Clinical Pharmacist 12/23/2018 8:41 AM

## 2018-12-23 NOTE — Progress Notes (Signed)
Pt has been instructed on administering Lovenox injection. Pt verbalizes understanding.

## 2018-12-23 NOTE — Progress Notes (Signed)
[facility] NAME: Angela [name]    MR#:  [id number]  DATE OF BIRTH:  07-13-1976  SUBJECTIVE: Admitted for right-sided pulmonary embolus.  Complains of right upper quadrant pain but denies any other complaints.  Today she is complaining of some cough, phlegm asking for Mucinex.Marland Kitchen  CHIEF COMPLAINT:   Chief Complaint  Patient presents with  . Shortness of Breath  c/o of left lower quadrant abdominal pain.  REVIEW OF SYSTEMS:   ROS CONSTITUTIONAL: No fever, fatigue or weakness.  EYES: No blurred or double vision.  EARS, NOSE, AND THROAT: No tinnitus or ear pain.  RESPIRATORY: No cough, shortness of breath, wheezing or hemoptysis.  CARDIOVASCULAR: No chest pain or shortness of breath today.. Right upper quadrant pain. GASTROINTESTINAL: Right-sided abdominal pain, no nausea or vomiting.Marland Kitchen GENITOURINARY: No dysuria, hematuria.  ENDOCRINE: No polyuria, nocturia,  HEMATOLOGY: No anemia, easy bruising or bleeding SKIN: No rash or lesion. MUSCULOSKELETAL: No joint pain or arthritis.   NEUROLOGIC: No tingling, numbness, weakness.  PSYCHIATRY: No anxiety or depression.   DRUG ALLERGIES:   Allergies  Allergen Reactions  . Amoxicillin Hives  . Lisinopril Cough  . Nortriptyline   . Other     Sulfa eye drops- Scratched corneas  . Penicillins Hives  . Sulfa Antibiotics     VITALS:  Blood pressure 112/67, pulse (!) 59, temperature 97.8 F (36.6 C), temperature source Oral, resp. rate 18, height 5\' 3"  (1.6 m), weight 134.1 kg, SpO2 100 %.  PHYSICAL EXAMINATION:  GENERAL:  43 y.o.-year-old patient lying in the bed with no acute distress.  EYES: Pupils equal, round, reactive to light and accommodation. No scleral icterus. Extraocular muscles intact.  HEENT: Head atraumatic, normocephalic. Oropharynx and nasopharynx clear.  NECK:  Supple, no jugular venous distention. No thyroid enlargement, no tenderness.  LUNGS: Normal  breath sounds bilaterally, no wheezing, rales,rhonchi or crepitation. No use of accessory muscles of respiration.  CARDIOVASCULAR: S1, S2 normal. No murmurs, rubs, or gallops.  ABDOMEN: Soft, nontender, nondistended.  Bowel sounds present.  EXTREMITIES: No pedal edema, cyanosis, or clubbing.  NEUROLOGIC: Cranial nerves II through XII are intact. Muscle strength 5/5 in all extremities. Sensation intact. Gait not checked.  PSYCHIATRIC: The patient is alert and oriented x 3.  SKIN: No obvious rash, lesion, or ulcer.    LABORATORY PANEL:   CBC Recent Labs  Lab 12/23/18 0529  WBC 9.0  HGB 10.7*  HCT 33.5*  PLT 359   ------------------------------------------------------------------------------------------------------------------  Chemistries  Recent Labs  Lab 12/23/18 0107  NA 136  K 3.9  CL 115*  CO2 16*  GLUCOSE 89  BUN 14  CREATININE 0.95  CALCIUM 7.6*  MG 2.2  AST 10*  ALT 8  ALKPHOS 40  BILITOT 0.2*   ------------------------------------------------------------------------------------------------------------------  Cardiac Enzymes Recent Labs  Lab 12/23/18 0107  TROPONINI <0.03   ------------------------------------------------------------------------------------------------------------------  RADIOLOGY:  US Venous Img Lower Bilateral  Result Date: 12/21/2018 CLINICAL DATA:  Pulmonary emboli, shortness of breath EXAM: BILATERAL LOWER EXTREMITY VENOUS DOPPLER ULTRASOUND TECHNIQUE: Gray-scale sonography with compression, as well as color and duplex ultrasound, were performed to evaluate the deep venous system from the level of the common femoral vein through the popliteal and proximal calf veins. COMPARISON:  03/18/2012 FINDINGS: Normal compressibility of the common femoral, superficial femoral, and popliteal veins, as well as the proximal calf veins. No filling defects to suggest DVT on grayscale or color Doppler imaging. Doppler waveforms show normal direction of  venous flow,  normal respiratory phasicity and response to augmentation. IMPRESSION: No femoropopliteal and no calf DVT in the visualized calf veins. If clinical symptoms are inconsistent or if there are persistent or worsening symptoms, further imaging (possibly involving the iliac veins) may be warranted. Electronically Signed   By: Lucrezia Europe M.D.   On: 12/21/2018 15:44    EKG:   Orders placed or performed during the hospital encounter of 12/20/18  . EKG 12-Lead  . EKG 12-Lead    ASSESSMENT AND PLAN:   #1. acute right-sided chest pain secondary to acute pulmonary embolus on the right side, no hypoxia, on heparin drip.  Patient not on oxygen, echocardiogram did not show any right heart strain.  Change heparin to Lovenox, continue Lovenox, Coumadin, spoke to patient's nurse Diane not to give training for the patient with Lovenox.    2.  Morbid obesity, acute PE, had history of for PE before was on anticoagulation patient told me that she was taking Coumadin before.  History of work-up including antiphospholipid antibody and lupus anticoagulant and reportedly were all negative.  Work-up repeated, sent out the labs yesterday. 3.  Depression: Continue Lexapro. #4 right upper quadrant pain likely secondary to right-sided PE, ultrasound of abdomen is nonspecific. 5.  Hypotension, ' hold Norvasc , Diamox. #6 Due to morbid obesity Eliquis may not be effective, continue with heparin, Coumadin. \ We will see if she can get education about Lovenox shots, plan to discharge her with Lovenox, Coumadin.    More than 50% time spent in counseling, coordination of care.  All the records are reviewed and case discussed with Care Management/Social Workerr. Management plans discussed with the patient, family and they are in agreement.  CODE STATUS: Full code  TOTAL TIME TAKING CARE OF THIS PATIENT: 38 minutes.   POSSIBLE D/C IN 1-2 DAYS, DEPENDING ON CLINICAL CONDITION.   Epifanio Lesches M.D on  12/23/2018 at 11:56 AM  Between 7am to 6pm - Pager - [phone]  After 6pm go to [email or web address] - password [email or web address] [facility]  Office  [phone]  CC: Primary care physician; System, Pcp Not In   Note: This dictation was prepared with Dragon dictation along with smaller phrase technology. Any transcriptional errors that result from this process are unintentional.

## 2018-12-23 NOTE — TOC Transition Note (Signed)
Transition of Care [facility]) - CM/SW Discharge Note   Patient Details  Name: Angela [name] MRN: [id number] Date of Birth: [birth year]  Transition of Care [facility]) CM/SW Contact:  Shade Flood, LCSW Phone Number: 12/23/2018, 2:23 PM   Clinical Narrative:     Pt discharging today. Pt on Lovenox at dc. RN CM, Josh, checked pt's benefits and she will have $1.30 co-pay. Met with pt to inform. She states that she will be able to afford this. Assisted pt in reviewing follow up INR appointment needs. Pt calling Dr. Cleon Dew office from her hospital room phone to schedule. Updated RN. Pt denies any other TOC needs for dc.  Final next level of care: Home/Self Care Barriers to Discharge: No Barriers Identified   Patient Goals and CMS Choice        Discharge Placement                       Discharge Plan and Services                                     Social Determinants of Health (SDOH) Interventions     Readmission Risk Interventions No flowsheet data found.

## 2018-12-23 NOTE — Discharge Summary (Signed)
Angela [name], is a 43 y.o. female  DOB April 07, 1976  MRN [id number].  Admission date:  12/20/2018  Admitting Physician  Christel Mormon, MD  Discharge Date:  12/23/2018   Primary MD  System, Pcp Not In  Recommendations for primary care physician for things to follow:  Patient PCP is [facility] and [facility] Dr. Iona Beard.  Was not listed but   I confirmed this with pt,she told me .   Admission Diagnosis  Pulmonary infarct ([place]) [I26.99] RUQ pain [R10.11]   Discharge Diagnosis  Pulmonary infarct ([place]) [I26.99] RUQ pain [R10.11]   Active Problems:   Pulmonary embolism with infarction [facility])      Past Medical History:  Diagnosis Date  . Anemia   . Anxiety   . Depression   . Fibromyalgia   . GERD (gastroesophageal reflux disease)   . Hypertension   . Irritable bowel syndrome (IBS)   . Pseudotumor cerebri   . Thyroid disease    hypothyroid  . Vision disturbance     Past Surgical History:  Procedure Laterality Date  . ABDOMINAL HYSTERECTOMY  02/06/13  . BREAST BIOPSY Right October, 2014   right breast core biopsy, fibroadenomatous changes  . BREAST BIOPSY Right December 2014   FNA retroareolar nodule consistent with fibroadenoma.  Marland Kitchen BREAST BIOPSY Right 12/16/2016   Fibroadenoma in the retroareolar area at 6:00.  Marland Kitchen [place]  . COLONOSCOPY  2015   Dr Allen Norris  . RHINOPLASTY    . TONSILECTOMY, ADENOIDECTOMY, BILATERAL MYRINGOTOMY AND TUBES  2004  . UPPER GASTROINTESTINAL ENDOSCOPY  2015   Dr Allen Norris       History of present illness and  Hospital Course:     Kindly see H&P for history of present illness and admission details, please review complete Labs, Consult reports and Test reports for all details in brief  HPI  from the history and physical done on the day of  admission 43 year old obese female with history of hypertension anxiety, depression, fibromyalgia, IBS, pseudotumor cerebri, history of left leg DVT, PE in 2007 comes in because of right-sided chest pain for 4 days associated with dyspnea.   Hospital Course  #1 acute pulmonary embolus on the right side, admitted to telemetry, started on heparin drip, Coumadin, but due to morbid obesity she is not a candidate for novel anticoagulants, 2D echocardiogram did not show any right heart strain, EF is more than 55%.  Not have any hypoxia, lower MD ultrasound did not show DVT.  Patient will be discharged home with Lovenox injections with 1 mg/kg every 12 hours along with the Coumadin, set up an appointment with PCP on Monday to check her INR to see if patient can be off of Lovenox.  Explained this to the patient.  Patient told me that she was on Coumadin before and she is willing to start Lovenox injections, PT tolerated education about Lovenox shots.  Patient is willing to go home today. 2.  Recurrent pulmonary embolus, hypercoagulable work-up sent out and she can follow with PCP regarding the lab results including protein C, protein S, lupus anticoagulant.  #3 .history of pseudotumor cerebri, patient is on the acetazolamide, for migraines she is on Topamax. 4.  Hypertension, patient is hypotensive here, we held amlodipine.  Recommend continue to hold amlodipine and see PCP on Monday to's follow-up and restart the BP medicines as appropriate. 5.  Morbid obesity, patient BMI is 52.36, advised to lose weight. Chronic pain syndrome, follows  up with pain management, patient takes buprenorphine for limb and says that she daily. 6.  Hypothyroidism, continue Synthroid 150 mcg daily. History of IBS, patient is on Linzess  7.  History of asthma but no wheezing this time.  Continue as needed albuterol inhalers.   Discharge Condition:stable   Follow UP  Follow-up Information    [facility]. Schedule an  appointment as soon as possible for a visit.   Why:  in 3 days       [place], [facility] Follow up in 3 day(s).   Why:  Follow-up appointment with l with Dr. Iona Beard on Monday follow-up on INR Contact information: [place] [address] [phone]             Discharge Instructions  and  Discharge Medications      Allergies as of 12/23/2018      Reactions   Amoxicillin Hives   Lisinopril Cough   Nortriptyline    Other    Sulfa eye drops- Scratched corneas   Penicillins Hives   Sulfa Antibiotics       Medication List    TAKE these medications   acetaZOLAMIDE 250 MG tablet Commonly known as:  DIAMOX Take 650 mg by mouth 3 (three) times daily.   Aimovig (140 MG Dose) 70 MG/ML Soaj Generic drug:  Erenumab-aooe Inject 1 Dose into the skin every 28 (twenty-eight) days.   albuterol 108 (90 Base) MCG/ACT inhaler Commonly known as:  VENTOLIN HFA Inhale 2 puffs into the lungs every 6 (six) hours as needed for wheezing.   amLODipine 10 MG tablet Commonly known as:  NORVASC Take 10 mg by mouth daily.   Belbuca 150 MCG Film Generic drug:  Buprenorphine HCl Place 150 mcg inside cheek daily.   Biotin 1000 MCG tablet Take 1,000 mcg by mouth 4 (four) times a week.   buPROPion 150 MG 12 hr tablet Commonly known as:  WELLBUTRIN SR Take 150 mg by mouth daily.   enoxaparin 150 MG/ML injection Commonly known as:  LOVENOX Inject 0.89 mLs (135 mg total) into the skin every 12 (twelve) hours.   escitalopram 20 MG tablet Commonly known as:  LEXAPRO Take 20 mg by mouth daily.   Flonase 50 MCG/ACT nasal spray Generic drug:  fluticasone 2 sprays by Each Nare route daily.   levothyroxine 150 MCG tablet Commonly known as:  SYNTHROID Take 150 mcg by mouth daily before breakfast.   linaclotide 145 MCG Caps capsule Commonly known as:  LINZESS Take 1 capsule (145 mcg total) by mouth daily before breakfast.   omeprazole 20 MG capsule Commonly known  as:  PRILOSEC Take 20 mg by mouth daily.   ondansetron 8 MG tablet Commonly known as:  ZOFRAN Take 8 mg by mouth every 8 (eight) hours as needed for nausea or vomiting.   prednisoLONE acetate 1 % ophthalmic suspension Commonly known as:  PRED FORTE Place 1 drop into both eyes every 4 (four) hours.   promethazine 25 MG tablet Commonly known as:  PHENERGAN Take 25 mg by mouth every 6 (six) hours as needed for nausea or vomiting.   rosuvastatin 20 MG tablet Commonly known as:  CRESTOR Take 20 mg by mouth daily.   tiZANidine 4 MG tablet Commonly known as:  ZANAFLEX Take 4 mg by mouth every 8 (eight) hours as needed for muscle spasms.   topiramate 100 MG tablet Commonly known as:  TOPAMAX Take 100 mg by mouth 2 (two) times daily.  warfarin 7.5 MG tablet Commonly known as:  Coumadin Take 1 tablet (7.5 mg total) by mouth daily.         Diet and Activity recommendation: See Discharge Instructions above   Consults obtained - none   Major procedures and Radiology Reports - PLEASE review detailed and final reports for all details, in brief -      Ct Angio Chest Pe W And/or Wo Contrast  Result Date: 12/20/2018 CLINICAL DATA:  Right upper quadrant pain with pleuritic chest pain. Positive D-dimer. EXAM: CT ANGIOGRAPHY CHEST WITH CONTRAST TECHNIQUE: Multidetector CT imaging of the chest was performed using the standard protocol during bolus administration of intravenous contrast. Multiplanar CT image reconstructions and MIPs were obtained to evaluate the vascular anatomy. CONTRAST:  72mL OMNIPAQUE IOHEXOL 350 MG/ML SOLN COMPARISON:  CTA chest 06/17/2005 FINDINGS: Cardiovascular: --Pulmonary arteries: Satisfactory contrast bolus. Beam attenuation caused by patient body habitus limits assessment beyond the lobar level.Within that limitation, there is no central pulmonary embolus. There is, however, a wedge-shaped focus of peripheral consolidation within the right lower lobe. The  adjacent segmental pulmonary artery branch appears relatively hypodense compared to other branches at the same level, probably indicating a small embolus (series 5 image 145). The main pulmonary artery is within normal limits for size. --Aorta: Limited opacification of the aorta due to bolus timing optimization for the pulmonary arteries. Conventional 3 vessel aortic branching pattern. The aortic course and caliber are normal. There is no aortic atherosclerosis. --Heart: Normal size. No pericardial effusion. Mediastinum/Nodes: No mediastinal, hilar or axillary lymphadenopathy. The visualized thyroid and thoracic esophageal course are unremarkable. Lungs/Pleura: Small right pleural effusion, with associated atelectasis. As above, there is a wedge-shaped focus of peripheral consolidation in the right lower lobe. Upper Abdomen: Contrast bolus timing is not optimized for evaluation of the abdominal organs. Within this limitation, the visualized organs of the upper abdomen are normal. Musculoskeletal: No chest wall abnormality. No acute or significant osseous findings. Review of the MIP images confirms the above findings. IMPRESSION: 1. Examination limited due to beam attenuation caused by patient body habitus, reducing sensitivity. 2. Wedge-shaped opacity in the right lower lobe lateral basal segment is most consistent with a pulmonary infarct. Relative hypoenhancement within the associated segmental artery probably represents a small pulmonary embolus, though it is difficult to be sure given limitations described above. 3. Small right pleural effusion with associated atelectasis. Critical Value/emergent results were called by telephone at the time of interpretation on 12/20/2018 at 10:23 pm to Dr. Conni Slipper , who verbally acknowledged these results. Electronically Signed   By: Ulyses Jarred M.D.   On: 12/20/2018 22:26   US Venous Img Lower Bilateral  Result Date: 12/21/2018 CLINICAL DATA:  Pulmonary emboli,  shortness of breath EXAM: BILATERAL LOWER EXTREMITY VENOUS DOPPLER ULTRASOUND TECHNIQUE: Gray-scale sonography with compression, as well as color and duplex ultrasound, were performed to evaluate the deep venous system from the level of the common femoral vein through the popliteal and proximal calf veins. COMPARISON:  03/18/2012 FINDINGS: Normal compressibility of the common femoral, superficial femoral, and popliteal veins, as well as the proximal calf veins. No filling defects to suggest DVT on grayscale or color Doppler imaging. Doppler waveforms show normal direction of venous flow, normal respiratory phasicity and response to augmentation. IMPRESSION: No femoropopliteal and no calf DVT in the visualized calf veins. If clinical symptoms are inconsistent or if there are persistent or worsening symptoms, further imaging (possibly involving the iliac veins) may be warranted. Electronically Signed  By: Lucrezia Europe M.D.   On: 12/21/2018 15:44   Dg Chest Portable 1 View  Result Date: 12/20/2018 CLINICAL DATA:  Pain and shortness of breath EXAM: PORTABLE CHEST 1 VIEW COMPARISON:  April 20, 2007 FINDINGS: No edema or consolidation. The heart size and pulmonary vascularity are normal. No adenopathy. No pneumothorax. No bone lesions. IMPRESSION: No edema or consolidation. Electronically Signed   By: Lowella Grip III M.D.   On: 12/20/2018 20:02   US Abdomen Limited Ruq  Result Date: 12/20/2018 CLINICAL DATA:  Right upper quadrant pain EXAM: ULTRASOUND ABDOMEN LIMITED RIGHT UPPER QUADRANT COMPARISON:  CT abdomen and pelvis November 04, 2010 FINDINGS: Gallbladder: No gallstones or wall thickening visualized. There is no pericholecystic fluid. No sonographic Murphy sign noted by sonographer. Common bile duct: Diameter: 4 mm. No intrahepatic or extrahepatic biliary duct dilatation. Liver: No focal lesion identified. Within normal limits in parenchymal echogenicity. Portal vein is patent on color Doppler imaging  with normal direction of blood flow towards the liver. There is a right pleural effusion. IMPRESSION: There is a right pleural effusion.  Study otherwise unremarkable. Electronically Signed   By: Lowella Grip III M.D.   On: 12/20/2018 20:30    Micro Results     Recent Results (from the past 240 hour(s))  SARS Coronavirus 2 [facility] order, Performed in [facility] hospital lab)     Status: None   Collection Time: 12/20/18 11:25 PM  Result Value Ref Range Status   SARS Coronavirus 2 NEGATIVE NEGATIVE Final    Comment: (NOTE) If result is NEGATIVE SARS-CoV-2 target nucleic acids are NOT DETECTED. The SARS-CoV-2 RNA is generally detectable in upper and lower  respiratory specimens during the acute phase of infection. The lowest  concentration of SARS-CoV-2 viral copies this assay can detect is 250  copies / mL. A negative result does not preclude SARS-CoV-2 infection  and should not be used as the sole basis for treatment or other  patient management decisions.  A negative result may occur with  improper specimen collection / handling, submission of specimen other  than nasopharyngeal swab, presence of viral mutation(s) within the  areas targeted by this assay, and inadequate number of viral copies  (<250 copies / mL). A negative result must be combined with clinical  observations, patient history, and epidemiological information. If result is POSITIVE SARS-CoV-2 target nucleic acids are DETECTED. The SARS-CoV-2 RNA is generally detectable in upper and lower  respiratory specimens dur ing the acute phase of infection.  Positive  results are indicative of active infection with SARS-CoV-2.  Clinical  correlation with patient history and other diagnostic information is  necessary to determine patient infection status.  Positive results do  not rule out bacterial infection or co-infection with other viruses. If result is PRESUMPTIVE POSTIVE SARS-CoV-2 nucleic acids MAY BE PRESENT.    A presumptive positive result was obtained on the submitted specimen  and confirmed on repeat testing.  While 2019 novel coronavirus  (SARS-CoV-2) nucleic acids may be present in the submitted sample  additional confirmatory testing may be necessary for epidemiological  and / or clinical management purposes  to differentiate between  SARS-CoV-2 and other Sarbecovirus currently known to infect humans.  If clinically indicated additional testing with an alternate test  methodology [id number]) is advised. The SARS-CoV-2 RNA is generally  detectable in upper and lower respiratory sp ecimens during the acute  phase of infection. The expected result is Negative. Fact Sheet for Patients:  StrictlyIdeas.no Fact Sheet for  Healthcare Providers: BankingDealers.co.za This test is not yet approved or cleared by the [place] and has been authorized for detection and/or diagnosis of SARS-CoV-2 by FDA under an Emergency Use Authorization (EUA).  This EUA will remain in effect (meaning this test can be used) for the duration of the COVID-19 declaration under Section 564(b)(1) of the Act, 21 U.S.C. section 360bbb-3(b)(1), unless the authorization is terminated or revoked sooner. Performed at [facility], [address] [place], [place] [address]        Today   Subjective:   Ghina Bittinger today has no headache,no chest abdominal pain,no new weakness tingling or numbness, feels much better wants to go home today.   Objective:   Blood pressure 112/67, pulse (!) 59, temperature 97.8 F (36.6 C), temperature source Oral, resp. rate 18, height 5\' 3"  (1.6 m), weight 134.1 kg, SpO2 100 %.   Intake/Output Summary (Last 24 hours) at 12/23/2018 1342 Last data filed at 12/23/2018 1145 Gross per 24 hour  Intake 3149.61 ml  Output 2800 ml  Net 349.61 ml    Exam Awake Alert, Oriented x 3, No new F.N deficits, Normal  affect [place].AT,PERRAL Supple Neck,No JVD, No cervical lymphadenopathy appriciated.  Symmetrical Chest wall movement, Good air movement bilaterally, CTAB RRR,No Gallops,Rubs or new Murmurs, No Parasternal Heave +ve B.Sounds, Abd Soft, Non tender, No organomegaly appriciated, No rebound -guarding or rigidity. No Cyanosis, Clubbing or edema, No new Rash or bruise  Data Review   CBC w Diff:  Lab Results  Component Value Date   WBC 9.0 12/23/2018   HGB 10.7 (L) 12/23/2018   HGB 11.8 (L) 01/25/2013   HCT 33.5 (L) 12/23/2018   HCT 31.7 (L) 02/07/2013   PLT 359 12/23/2018   PLT 389 03/22/2013   LYMPHOPCT 25 12/20/2018   LYMPHOPCT 25.7 03/18/2012   MONOPCT 7 12/20/2018   MONOPCT 6.5 03/18/2012   EOSPCT 1 12/20/2018   EOSPCT 0.7 03/18/2012   BASOPCT 1 12/20/2018   BASOPCT 0.6 03/18/2012    CMP:  Lab Results  Component Value Date   NA 136 12/23/2018   NA 140 02/07/2013   K 3.9 12/23/2018   K 3.7 02/07/2013   CL 115 (H) 12/23/2018   CL 110 (H) 02/07/2013   CO2 16 (L) 12/23/2018   CO2 25 02/07/2013   BUN 14 12/23/2018   BUN 7 02/07/2013   CREATININE 0.95 12/23/2018   CREATININE 1.01 02/07/2013   PROT 5.5 (L) 12/23/2018   PROT 7.6 03/18/2012   ALBUMIN 2.7 (L) 12/23/2018   ALBUMIN 3.4 03/22/2013   BILITOT 0.2 (L) 12/23/2018   BILITOT 0.3 03/18/2012   ALKPHOS 40 12/23/2018   ALKPHOS 51 03/18/2012   AST 10 (L) 12/23/2018   AST 12 (L) 03/18/2012   ALT 8 12/23/2018   ALT 14 03/18/2012  .   Total Time in preparing paper work, data evaluation and todays exam - 55 minutes  Epifanio Lesches M.D on 12/23/2018 at 1:42 PM    Note: This dictation was prepared with Dragon dictation along with smaller phrase technology. Any transcriptional errors that result from this process are unintentional.

## 2018-12-23 NOTE — Care Management Important Message (Signed)
Important Message  Patient Details  Name: Modesty Rudy MRN: [id number] Date of Birth: 1975/11/25   Medicare Important Message Given:  Yes    Dannette Barbara 12/23/2018, 12:03 PM

## 2018-12-23 NOTE — Consult Note (Signed)
ANTICOAGULATION CONSULT NOTE   Pharmacy Consult for Heparin and Warfarin Indication: pulmonary embolus/Pulmonary infarct   Patient Measurements: Height: 5\' 3"  (160 cm) Weight: 295 lb 9.6 oz (134.1 kg) IBW/kg (Calculated) : 52.4 Heparin Dosing Weight: 85kg  Vital Signs: Temp: 97.8 F (36.6 C) (05/15 0415) Temp Source: Oral (05/15 0415) BP: 118/76 (05/15 0415) Pulse Rate: 60 (05/15 0415)  Labs: Recent Labs    12/20/18 1859  12/20/18 2325 12/21/18 0529  12/22/18 0526 12/22/18 1227 12/22/18 2127 12/23/18 0107 12/23/18 0529  HGB 12.4  --   --  12.5  --   --   --   --   --  10.7*  HCT 37.9  --   --  37.6  --   --   --   --   --  33.5*  PLT 397  --   --  376  --   --   --   --   --  359  APTT  --   --  40*  --   --   --   --   --   --   --   LABPROT  --    < > 14.1 14.2  --  14.5  --   --   --  15.8*  INR  --    < > 1.1 1.1  --  1.1  --   --   --  1.3*  HEPARINUNFRC  --   --   --  0.54   < > 0.70 0.70 0.45  --  0.50  CREATININE 0.94  --   --  0.85  --   --   --   --  0.95  --   TROPONINI  --   --   --   --   --   --   --   --  <0.03  --    < > = values in this interval not displayed.    Estimated Creatinine Clearance: 102.6 mL/min (by C-G formula based on SCr of 0.95 mg/dL).   Medical History: Past Medical History:  Diagnosis Date  . Anemia   . Anxiety   . Depression   . Fibromyalgia   . GERD (gastroesophageal reflux disease)   . Hypertension   . Irritable bowel syndrome (IBS)   . Pseudotumor cerebri   . Thyroid disease    hypothyroid  . Vision disturbance     Assessment: Pharmacy consulted for heparin drip dosing and monitoring for 43 yo female admitted with pulmonary embolus/Pulmonary infarct. Started on warfarin. Per notes pt was on warfarin previously for past PE/DVT. DOAC would not be recommended in this patient as the ISTH recommend to avoid the use of DOAC in patient's with a BMI > 40. BMI 51. Warfarin would be the best anticoagulation agent for this  patient with the current data.Given patient recently started warfarin and d/t obesity, patient may require a less conservative amount of warfarin to become therapeutic.   Heparin Course: 5/12 Heparin infusion started at 1500 units/hr  5/13 0529 HL 0.54  5/13 1240 HL 0.62  5/14 0526 HL 0.70: rate decreased to 1450 units/hr 5/14 1227 HL 0.70: rate decreased to 1400 units/hr 5/14 2127 HL 0.45: continue at 1400 units/hr 5/15 0529 HL 0.50  Drug Interactions:  Ketorolac -- increase risk bleeding  Levothyroxine -- hypoprothrombinemic effect may be increased   Date INR Warfarin Dose  5/12 1.1   5/13 1.1 5mg  0300 + 5mg  1800  5/14 1.1  10mg     Goal of Therapy:  Heparin level 0.3-0.7 units/ml INR level 2-3 Monitor platelets by anticoagulation protocol: Yes   Plan:  5/15 AM HL 0.50. Level remains therapeutic: Continue at 1400 units/hr --recheck heparin level with AM labs --CBC in am per protocol  Warfarin:  Daily INR ordered. Pharmacy will continue to monitor H&H and platelets and follow.    Pernell Dupre, PharmD, BCPS Clinical Pharmacist 12/23/2018 6:52 AM

## 2018-12-26 DIAGNOSIS — I2699 Other pulmonary embolism without acute cor pulmonale: Secondary | ICD-10-CM | POA: Insufficient documentation

## 2018-12-26 LAB — PROTEIN S PANEL
Protein S Activity: 92 % (ref 63–140)
Protein S Ag, Free: 92 % (ref 57–157)
Protein S Ag, Total: 88 % (ref 60–150)

## 2018-12-26 LAB — PROTEIN C ACTIVITY: Protein C Activity: 97 % (ref 73–180)

## 2018-12-27 LAB — HEXAGONAL PHASE PHOSPHOLIPID: Hexagonal Phase Phospholipid: 0 s (ref 0–11)

## 2018-12-27 LAB — ANTIPHOSPHOLIPID SYNDROME EVAL, BLD
Anticardiolipin IgA: <9 APL U/mL (ref 0–11)
Anticardiolipin IgG: <9 GPL U/mL (ref 0–14)
Anticardiolipin IgM: <9 MPL U/mL (ref 0–12)
DRVVT: 37 s (ref 0.0–47.0)
PTT Lupus Anticoagulant: 59.8 s — ABNORMAL HIGH (ref 0.0–51.9)
Phosphatydalserine, IgA: 3 APS IgA (ref 0–20)
Phosphatydalserine, IgG: 3 GPS IgG (ref 0–11)
Phosphatydalserine, IgM: 5 MPS IgM (ref 0–25)

## 2018-12-27 LAB — PTT-LA MIX: PTT-LA Mix: 54.7 s — ABNORMAL HIGH (ref 0.0–48.9)

## 2019-01-05 ENCOUNTER — Other Ambulatory Visit: Payer: Self-pay

## 2019-01-05 NOTE — Progress Notes (Addendum)
[facility]  [address] [place], [place] [address] Phone: [phone]  Fax: [phone]   Clinic Day:  01/06/2019  Referring physician: Sharyne Peach, MD  Chief Complaint: Angela [name] is a 43 y.o. female with recurrent pulmonary embolism who is referred in consultation with Dr Iona Beard for assessment and management.   HPI:  The patient has a history of pulmonary embolism and left lower extremity DVT in 2006.  She presented with a "bubbling" sensation in her chest.  Chest CT with contrast on 06/17/2005 revealed a pulmonary embolism in the LEFT lower lobe with either evolving infarct or focal infiltrate also in the LEFT lower lobe.   Etiology was felt secondary to oral contraceptives that she had been on for 1 month.  She was on Coumadin for 6 months to 1 year, but is unsure of the dose or definitive length. She has not been on contraceptives since that time.   She describes  leg pain in addition to wrist and arm pain, not related to a fall, with movement for several weeks prior to her recent DVT.  She developed RUQ abdominal pain and pleuritic pain on 12/18/2018.  She describes a 3 hour car trip and passing a kidney stone earlier that day.  The following day she felt flu-like symptoms and took OTC medication and went to sleep. The next day, she became extremely short of breath and went to the [facility] ER.   She was admitted to [facility] from 12/20/2018 - 12/23/2018 with an acute right PE with associated infarct.  She presented with shortness of breath and pleuritic chest pain x 4 days.  She denied any lower extremity pain or swelling.  Chest CT angiogram on 12/20/2018 revealed a wedge-shaped opacity in the right lower lobe lateral basal segment consistent with a pulmonary infarct. There was relative hypoenhancement within the associated segmental artery probably secondary to a small pulmonary embolus, though it was difficult to assess given limitations of  beam attenuation caused by the patient's body habitus.  There was a small right pleural effusion with associated atelectasis.  Bilateral lower extremity duplex on 12/21/2018 revealed no femoropopliteal and no calf DVT in the visualized calf veins.  She was started on a heparin drip.  Morbid obesity rendered her ineligible for novel anticoagulants.  Echocardiogram showed ejection fraction >55%.  There was no right heart strain.  She was discharged with Lovenox injections 1mg /kg every 12 hours and Coumadin 7.5mg  (began 12/23/2018).   Work-up during her admission included the following normal/negative labs: CBC with diff, lupus anticoagulant, protein C activity, and protein S activity/antigen.  She notes a prior concern for an autoimmune disorder.  She was seen by Dr. Lahoma Rocker at [facility] : [facility].  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  No clear etiology was found for her panuveitis, other than the association with PR3 positivity. Repeat testing was negative in 06/2013. She had no sinupulmonary or renal disease to suggest systemic involvement. She received high dose corticosteroids, local corticosteroid eye drops, and corticosteroid injections, and subsequently MTX in early 2015 which she could not tolerate.  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  She received Humira beginning in 10/2013 for about 3-4 months.  Humira was discontinued in 02/2014 due to recurrent cold and skin dryness. She discontinued  oral steroids and received eye drops and  as per last opthalmology evaluation her eye exam 12/10/2014 was quiescent.  She last saw rheumatology in 07/22/2015.  She was referred by Dr. Salome Holmes at [facility].  Factor V Leiden was negative on 12/28/2018.   PT was 15.8 with an INR of 1.3 on 12/23/2018.  PT was 29.1 with an  INR of 2.4 on 12/28/2018.  Next INR check is scheduled for 01/11/2019.  Symptomatically, she reports "I don't feel well."  She notes cramping in left calf x 2 days. She has difficulty sleeping due to LUQ abdominal pain x 3 days. When she lies down she coughs, and has been sleeping in a recliner. This has increased her back pain. She is only comfortable sleeping when someone is around to watch her.   She denies any fevers or sweats.  She notes her weight fluctuates a lot, and she can lose 7lbs and gain it back.  She has decreased appetite. She lost 50lbs in 2013 and was then put on steroids for vision issues and gained it back. She was going to start weight loss program at the weight loss clinic right before she had the DVT. She is interested in bariatric surgery.   Symptomatically, her breathing has slightly improved since her hospitalization. She is short of breath when making her bed or walking around. She has difficulty taking deep breaths.   She notes, "everything I ate and drank went straight through me yesterday."  She is currently very stressed out and "paranoid."    She has consult was vascular surgery next week for filter placement.  She had received pain management in [place], but does not have a local pain management doctor.   Her sister had a DVT and stroke last year at 44 yo. She is diabetic, has hyperlipidemia, has an enlarged heart, is overweight, and has hypothyroidism. She is on Eliquis. She denies any other family history of blood disorders.    Past Medical History:  Diagnosis Date   Anemia    Anxiety    Depression    Fibromyalgia    GERD (gastroesophageal reflux disease)    Hypertension    Irritable bowel syndrome (IBS)    Pseudotumor cerebri    Raynaud's disease    Thyroid disease    hypothyroid   Vision disturbance     Past Surgical History:  Procedure Laterality Date   ABDOMINAL HYSTERECTOMY  02/06/13   BREAST BIOPSY Right October, 2014   right  breast core biopsy, fibroadenomatous changes   BREAST BIOPSY Right December 2014   FNA retroareolar nodule consistent with fibroadenoma.   BREAST BIOPSY Right 12/16/2016   Fibroadenoma in the retroareolar area at 6:00.   CESAREAN SECTION  1993, 1996, 1997   COLONOSCOPY  2015   Dr Allen Norris   RHINOPLASTY     TONSILECTOMY, ADENOIDECTOMY, BILATERAL MYRINGOTOMY AND TUBES  2004   UPPER GASTROINTESTINAL ENDOSCOPY  2015   Dr Allen Norris    Family History  Problem Relation Age of Onset   Depression Mother    Migraines Mother    Diverticulitis Mother    Hypertension Mother    Heart disease Father    Diabetes Father    Hypertension Father    Stroke Sister    Cancer Maternal Aunt    Cancer Maternal Grandmother     Social History:  reports that she has never smoked. She has never used smokeless tobacco. She reports current alcohol use. She reports that she does not use drugs. She denies drug use. She drinks  alcohol occasionally, in social settings. She has 3 children and 3 grandchildren. She lives in [place].  The patient is alone today, but was brought by her son to the clinic.   Allergies:  Allergies  Allergen Reactions   Amoxicillin Hives   Lisinopril Cough   Nortriptyline    Other     Sulfa eye drops- Scratched corneas   Penicillins Hives   Sulfa Antibiotics     Current Medications: Current Outpatient Medications  Medication Sig Dispense Refill   acetaZOLAMIDE (DIAMOX) 250 MG tablet Take 650 mg by mouth 3 (three) times daily.     albuterol (PROVENTIL HFA;VENTOLIN HFA) 108 (90 BASE) MCG/ACT inhaler Inhale 2 puffs into the lungs every 6 (six) hours as needed for wheezing.     Biotin 1000 MCG tablet Take 1,000 mcg by mouth 4 (four) times a week.     Buprenorphine HCl (BELBUCA) 150 MCG FILM Place 150 mcg inside cheek daily.     buPROPion (WELLBUTRIN SR) 150 MG 12 hr tablet Take 150 mg by mouth daily.     Difluprednate (DUREZOL) 0.05 % EMUL Durezol 0.05 % eye  drops  PLACE 1 DROP INTO RIGHT EYE EVERY DAY FOR 7 DAYS     diphenhydrAMINE (BENADRYL) 25 mg capsule Take 25 mg by mouth every 6 (six) hours as needed.      Docusate Sodium 100 MG capsule Take 100 mg by mouth 2 (two) times daily as needed.      Erenumab-aooe (AIMOVIG, 140 MG DOSE,) 70 MG/ML SOAJ Inject 1 Dose into the skin every 28 (twenty-eight) days.     escitalopram (LEXAPRO) 20 MG tablet Take 20 mg by mouth daily.     levothyroxine (SYNTHROID) 150 MCG tablet Take 150 mcg by mouth daily before breakfast.      linaclotide (LINZESS) 145 MCG CAPS capsule Take 1 capsule (145 mcg total) by mouth daily before breakfast. (Patient taking differently: Take 145 mcg by mouth as needed. ) 90 capsule 3   Melatonin 5 MG CAPS Take 1 tablet by mouth at bedtime.     omeprazole (PRILOSEC) 20 MG capsule Take 20 mg by mouth every other day.      ondansetron (ZOFRAN) 8 MG tablet Take 8 mg by mouth every 8 (eight) hours as needed for nausea or vomiting.     oxybutynin (DITROPAN-XL) 10 MG 24 hr tablet Take 10 mg by mouth at bedtime.      prednisoLONE acetate (PRED FORTE) 1 % ophthalmic suspension Place 1 drop into both eyes every 4 (four) hours.     promethazine (PHENERGAN) 25 MG tablet Take 25 mg by mouth every 6 (six) hours as needed for nausea or vomiting.     rosuvastatin (CRESTOR) 20 MG tablet Take 20 mg by mouth daily.     sodium fluoride (DENTAGEL) 1.1 % GEL dental gel DentaGel 1.1 %  AFTER BRUSHING AND FLOSSING BRUSH WITH GEL FOR 3 MINUTES ONCE A DAY, DO NOT EAT,DRINK FOR 30 MINUTES     tiZANidine (ZANAFLEX) 4 MG tablet Take 4 mg by mouth every 8 (eight) hours as needed for muscle spasms.     topiramate (TOPAMAX) 100 MG tablet Take 100 mg by mouth 2 (two) times daily.      warfarin (COUMADIN) 7.5 MG tablet Take 1 tablet (7.5 mg total) by mouth daily. 30 tablet 11   butalbital-acetaminophen-caffeine (FIORICET) 50-325-40 MG tablet Take 1 tablet by mouth as needed.     clonazePAM (KLONOPIN)  0.5 MG tablet Take 1 tablet by  mouth as needed.     cyclobenzaprine (FLEXERIL) 10 MG tablet Take 1 tablet by mouth as needed.     EPINEPHrine (EPIPEN 2-PAK) 0.3 mg/0.3 mL IJ SOAJ injection EpiPen 2-Pak 0.3 mg/0.3 mL injection, auto-injector  INJECT 0.3 MLS (0.3 MG TOTAL) INTO THE MUSCLE ONCE AS NEEDED FOR ANAPHYLAXIS FOR UP TO 1 DOSE.     fluticasone (FLONASE) 50 MCG/ACT nasal spray 2 sprays by Each Nare route daily.     No current facility-administered medications for this visit.     Review of Systems  Constitutional: Positive for malaise/fatigue and weight loss (fluctuates, down 5lbs since hospitalization). Negative for chills, diaphoresis and fever.  HENT: Negative for congestion, hearing loss, nosebleeds and sinus pain.   Eyes: Positive for blurred vision (vision issues x7 years).  Respiratory: Positive for cough (when lying down) and shortness of breath (moderate, exertional). Negative for sputum production.   Cardiovascular: Negative for chest pain, palpitations, orthopnea, leg swelling and PND.  Gastrointestinal: Positive for abdominal pain (RUQ and LUQ), diarrhea and nausea (takes ondansetron). Negative for blood in stool, constipation, heartburn and vomiting.       Irritable bowel syndrome.  Genitourinary: Positive for urgency (Pressure with urination). Negative for dysuria and frequency.       Occasional bladder spasm.  Musculoskeletal: Positive for back pain (s/p cortisone injections), joint pain (knees, hips) and myalgias (left lower leg cramps).       Fibromylagia.  Skin: Negative for rash.  Neurological: Positive for headaches ("every day of my life"). Negative for dizziness, tingling, sensory change and weakness.  Endo/Heme/Allergies: Bruises/bleeds easily (arms and legs secondary to grandchildren).  Psychiatric/Behavioral: Negative for depression and memory loss. The patient is nervous/anxious (very stressed) and has insomnia.   All other systems reviewed and are  negative.  Performance status (ECOG): 2  Physical Exam  Constitutional: She is oriented to person, place, and time. She appears well-developed and well-nourished. No distress.  HENT:  Head: Normocephalic and atraumatic.  Mouth/Throat: Oropharynx is clear and moist. No oropharyngeal exudate.  Long dark hair.  Wearing a mask.   Eyes: Pupils are equal, round, and reactive to light. Conjunctivae and EOM are normal. No scleral icterus.  Glasses.  Brown eyes with blue contacts.  Neck: Normal range of motion. Neck supple. No JVD present.  Cardiovascular: Normal rate, regular rhythm and normal heart sounds.  No murmur heard. Pulmonary/Chest: Effort normal and breath sounds normal. No respiratory distress. She has no wheezes.  Abdominal: Soft. Bowel sounds are normal. She exhibits no distension and no mass. There is no hepatosplenomegaly. There is abdominal tenderness in the left upper quadrant. There is no rebound and no guarding.  Musculoskeletal: Normal range of motion.        General: No tenderness or edema.     Comments: Homan's sign negative.  Lymphadenopathy:    She has no cervical adenopathy.  Neurological: She is alert and oriented to person, place, and time.  Skin: Skin is warm and dry. No rash noted. She is not diaphoretic. No erythema. No pallor.  Psychiatric: She has a normal mood and affect. Her behavior is normal. Judgment and thought content normal.  Nursing note and vitals reviewed.   No visits with results within 3 Day(s) from this visit.  Latest known visit with results is:  Admission on 12/20/2018, Discharged on 12/23/2018  Component Date Value Ref Range Status   Preg Test, Ur 12/20/2018 NEGATIVE  NEGATIVE Final   Comment:        THE  SENSITIVITY OF THIS METHODOLOGY IS >24 mIU/mL    Sodium 12/20/2018 140  135 - 145 mmol/L Final   Potassium 12/20/2018 3.9  3.5 - 5.1 mmol/L Final   Chloride 12/20/2018 114* 98 - 111 mmol/L Final   CO2 12/20/2018 21* 22 - 32 mmol/L  Final   Glucose, Bld 12/20/2018 89  70 - 99 mg/dL Final   BUN 12/20/2018 11  6 - 20 mg/dL Final   Creatinine, Ser 12/20/2018 0.94  0.44 - 1.00 mg/dL Final   Calcium 12/20/2018 8.5* 8.9 - 10.3 mg/dL Final   Total Protein 12/20/2018 6.9  6.5 - 8.1 g/dL Final   Albumin 12/20/2018 3.5  3.5 - 5.0 g/dL Final   AST 12/20/2018 11* 15 - 41 U/L Final   ALT 12/20/2018 9  0 - 44 U/L Final   Alkaline Phosphatase 12/20/2018 53  38 - 126 U/L Final   Total Bilirubin 12/20/2018 0.3  0.3 - 1.2 mg/dL Final   GFR calc non Af Amer 12/20/2018 >60  >60 mL/min Final   GFR calc Af Amer 12/20/2018 >60  >60 mL/min Final   Anion gap 12/20/2018 5  5 - 15 Final   Performed at [facility], [address] [place], [place] [address]   Lipase 12/20/2018 31  11 - 51 U/L Final   Performed at [facility], [address] [place], [place] [address]   WBC 12/20/2018 10.9* 4.0 - 10.5 K/uL Final   RBC 12/20/2018 4.40  3.87 - 5.11 MIL/uL Final   Hemoglobin 12/20/2018 12.4  12.0 - 15.0 g/dL Final   HCT 12/20/2018 37.9  36.0 - 46.0 % Final   MCV 12/20/2018 86.1  80.0 - 100.0 fL Final   MCH 12/20/2018 28.2  26.0 - 34.0 pg Final   MCHC 12/20/2018 32.7  30.0 - 36.0 g/dL Final   RDW 12/20/2018 13.2  11.5 - 15.5 % Final   Platelets 12/20/2018 397  150 - 400 K/uL Final   nRBC 12/20/2018 0.0  0.0 - 0.2 % Final   Neutrophils Relative % 12/20/2018 66  % Final   Neutro Abs 12/20/2018 7.2  1.7 - 7.7 K/uL Final   Lymphocytes Relative 12/20/2018 25  % Final   Lymphs Abs 12/20/2018 2.7  0.7 - 4.0 K/uL Final   Monocytes Relative 12/20/2018 7  % Final   Monocytes Absolute 12/20/2018 0.7  0.1 - 1.0 K/uL Final   Eosinophils Relative 12/20/2018 1  % Final   Eosinophils Absolute 12/20/2018 0.2  0.0 - 0.5 K/uL Final   Basophils Relative 12/20/2018 1  % Final   Basophils Absolute 12/20/2018 0.1  0.0 - 0.1 K/uL Final   Immature Granulocytes 12/20/2018 0  % Final   Abs Immature  Granulocytes 12/20/2018 0.04  0.00 - 0.07 K/uL Final   Performed at [facility], [address] [place], [place] [address]   Fibrin derivatives D-dimer [facility]) 12/20/2018 1,706.47* 0.00 - 499.00 ng/mL (FEU) Final   Comment: (NOTE) <> Exclusion of Venous Thromboembolism (VTE) - OUTPATIENT ONLY   (Emergency Department or [place])   0-499 ng/ml (FEU): With a low to intermediate pretest probability                      for VTE this test result excludes the diagnosis                      of VTE.   >499 ng/ml (FEU) : VTE not excluded; additional work up  for VTE is                      required. <> Testing on Inpatients and Evaluation of Disseminated Intravascular   Coagulation (DIC) Reference Range:   0-499 ng/ml (FEU) Performed at [facility], [place], [place] [address]    aPTT 12/20/2018 40* 24 - 36 seconds Final   Comment:        IF BASELINE aPTT IS ELEVATED, SUGGEST PATIENT RISK ASSESSMENT BE USED TO DETERMINE APPROPRIATE ANTICOAGULANT THERAPY. Performed at [facility], [address] [place], [place] [address]    Prothrombin Time 12/20/2018 14.1  11.4 - 15.2 seconds Final   INR 12/20/2018 1.1  0.8 - 1.2 Final   Comment: (NOTE) INR goal varies based on device and disease states. Performed at [facility], [address] [place], [place] [address]    Sodium 12/21/2018 141  135 - 145 mmol/L Final   Potassium 12/21/2018 3.8  3.5 - 5.1 mmol/L Final   Chloride 12/21/2018 115* 98 - 111 mmol/L Final   CO2 12/21/2018 17* 22 - 32 mmol/L Final   Glucose, Bld 12/21/2018 102* 70 - 99 mg/dL Final   BUN 12/21/2018 9  6 - 20 mg/dL Final   Creatinine, Ser 12/21/2018 0.85  0.44 - 1.00 mg/dL Final   Calcium 12/21/2018 8.6* 8.9 - 10.3 mg/dL Final   GFR calc non Af Amer 12/21/2018 >60  >60 mL/min Final   GFR calc Af Amer 12/21/2018 >60  >60 mL/min Final   Anion gap 12/21/2018 9  5 - 15 Final   Performed at [facility], [address] [place], [place] [address]   WBC 12/21/2018 11.6* 4.0 - 10.5 K/uL Final   RBC 12/21/2018 4.41  3.87 - 5.11 MIL/uL Final   Hemoglobin 12/21/2018 12.5  12.0 - 15.0 g/dL Final   HCT 12/21/2018 37.6  36.0 - 46.0 % Final   MCV 12/21/2018 85.3  80.0 - 100.0 fL Final   MCH 12/21/2018 28.3  26.0 - 34.0 pg Final   MCHC 12/21/2018 33.2  30.0 - 36.0 g/dL Final   RDW 12/21/2018 13.2  11.5 - 15.5 % Final   Platelets 12/21/2018 376  150 - 400 K/uL Final   nRBC 12/21/2018 0.0  0.0 - 0.2 % Final   Performed at [facility], [address] [place], [place] [address]   Prothrombin Time 12/21/2018 14.2  11.4 - 15.2 seconds Final   INR 12/21/2018 1.1  0.8 - 1.2 Final   Comment: (NOTE) INR goal varies based on device and disease states. Performed at [facility], [address] [place],  [address]    SARS Coronavirus 2 12/20/2018 NEGATIVE  NEGATIVE Final   Comment: (NOTE) If result is NEGATIVE SARS-CoV-2 target nucleic acids are NOT DETECTED. The SARS-CoV-2 RNA is generally detectable in upper and lower  respiratory specimens during the acute phase of infection. The lowest  concentration of SARS-CoV-2 viral copies this assay can detect is 250  copies / mL. A negative result does not preclude SARS-CoV-2 infection  and should not be used as the sole basis for treatment or other  patient management decisions.  A negative result may occur with  improper specimen collection / handling, submission of specimen other  than nasopharyngeal swab, presence of viral mutation(s) within the  areas targeted by this assay, and inadequate number of viral copies  (<250 copies / mL). A negative result must be  combined with clinical  observations, patient history, and epidemiological information. If result is POSITIVE SARS-CoV-2 target nucleic acids are DETECTED. The SARS-CoV-2 RNA is generally detectable in upper and lower  respiratory specimens  dur                          ing the acute phase of infection.  Positive  results are indicative of active infection with SARS-CoV-2.  Clinical  correlation with patient history and other diagnostic information is  necessary to determine patient infection status.  Positive results do  not rule out bacterial infection or co-infection with other viruses. If result is PRESUMPTIVE POSTIVE SARS-CoV-2 nucleic acids MAY BE PRESENT.   A presumptive positive result was obtained on the submitted specimen  and confirmed on repeat testing.  While 2019 novel coronavirus  (SARS-CoV-2) nucleic acids may be present in the submitted sample  additional confirmatory testing may be necessary for epidemiological  and / or clinical management purposes  to differentiate between  SARS-CoV-2 and other Sarbecovirus currently known to infect humans.  If clinically indicated additional testing with an alternate test  methodology [phone]) is advised. The SARS-CoV-2 RNA is generally  detectable in upper and lower respiratory sp                          ecimens during the acute  phase of infection. The expected result is Negative. Fact Sheet for Patients:  StrictlyIdeas.no Fact Sheet for Healthcare Providers: BankingDealers.co.za This test is not yet approved or cleared by the [place] FDA and has been authorized for detection and/or diagnosis of SARS-CoV-2 by FDA under an Emergency Use Authorization (EUA).  This EUA will remain in effect (meaning this test can be used) for the duration of the COVID-19 declaration under Section 564(b)(1) of the Act, 21 U.S.C. section 360bbb-3(b)(1), unless the authorization is terminated or revoked sooner. Performed at [facility], [place], [place] [address]    Heparin Unfractionated 12/21/2018 0.54  0.30 - 0.70 IU/mL Final   Comment: (NOTE) If heparin results are below expected values, and patient  dosage has  been confirmed, suggest follow up testing of antithrombin III levels. Performed at [facility], [address] [place], [place] [address]    Weight 12/21/2018 4,608  oz Final   Height 12/21/2018 63  in Final   BP 12/21/2018 100/63  mmHg Final   TSH 12/21/2018 3.967  0.350 - 4.500 uIU/mL Final   Comment: Performed by a 3rd Generation assay with a functional sensitivity of <=0.01 uIU/mL. Performed at [facility], [place], [place] [address]    Heparin Unfractionated 12/21/2018 0.62  0.30 - 0.70 IU/mL Final   Comment: (NOTE) If heparin results are below expected values, and patient dosage has  been confirmed, suggest follow up testing of antithrombin III levels. Performed at [facility], [address] [place], [place] [address]    Prothrombin Time 12/22/2018 14.5  11.4 - 15.2 seconds Final   INR 12/22/2018 1.1  0.8 - 1.2 Final   Comment: (NOTE) INR goal varies based on device and disease states. Performed at [facility], [place], [place] [address]    Heparin Unfractionated 12/22/2018 0.70  0.30 - 0.70 IU/mL Final   Comment: (NOTE) If heparin results are below expected values, and patient dosage has  been confirmed, suggest follow up testing of antithrombin III levels. Performed  at [facility], [place], [place] [address]    Heparin Unfractionated 12/22/2018 0.70  0.30 - 0.70 IU/mL Final   Comment: (NOTE) If heparin results are below expected values, and patient dosage has  been confirmed, suggest follow up testing of antithrombin III levels. Performed at [facility], [place], [place] [address]    Anticardiolipin IgA 12/22/2018 <9  0 - 11 APL U/mL Final   Comment: (NOTE)                          Negative:              <12                          Indeterminate:     12 - 20                          Low-Med Positive: >20 - 80                           High Positive:         >80 Performed At: [facility] [place], [place] [id number] Rush Farmer MD OX:[id number]    Anticardiolipin IgG 12/22/2018 <9  0 - 14 GPL U/mL Final   Comment: (NOTE)                          Negative:              <15                          Indeterminate:     15 - 20                          Low-Med Positive: >20 - 80                          High Positive:         >80    Anticardiolipin IgM 12/22/2018 <9  0 - 12 MPL U/mL Final   Comment: (NOTE)                          Negative:              <13                          Indeterminate:     13 - 20                          Low-Med Positive: >20 - 80                          High Positive:         >80    PTT Lupus Anticoagulant 12/22/2018 59.8* 0.0 - 51.9 sec Final   Comment: (NOTE) Additional testing confirms the presence of heparin in the test sample. Results obtained after heparin neutralization.    DRVVT 12/22/2018 37.0  0.0 - 47.0 sec Final   Phosphatydalserine, IgG 12/22/2018 3  0 - 11 GPS IgG Final   Phosphatydalserine, IgM 12/22/2018 5  0 - 25 MPS IgM Final   Phosphatydalserine, IgA 12/22/2018 3  0 - 20 APS IgA Final   Lupus Anticoag Interp 12/22/2018 Comment:   Corrected   Comment: (NOTE) No lupus anticoagulant was detected. Results suggest the presence of an inhibitor.  The presence of heparin, which is a non-specific inhibitor, may cause this pattern of results. Since the PTT-LA was extended and the dRVVT was within normal limits, a specific inhibitor to factor VIII, IX, XI, or XII cannot be excluded.    Protein C Activity 12/22/2018 97  73 - 180 % Final   Comment: (NOTE) Performed At: [facility] [place], [place] [id number] Rush Farmer MD [id number]    Protein S Ag, Total 12/22/2018 88  60 - 150 % Final   Comment: (NOTE) This test was developed and its performance characteristics determined by  [facility]. It has not been cleared or approved by the Food and Drug Administration.    Protein S Ag, Free 12/22/2018 92  57 - 157 % Final   Comment: (NOTE) This test was developed and its performance characteristics determined by [facility]. It has not been cleared or approved by the Food and Drug Administration.    Protein S Activity 12/22/2018 92  63 - 140 % Final   Comment: (NOTE) Protein S activity may be falsely increased (masking an abnormal, low result) in patients receiving direct Xa inhibitor (e.g., rivaroxaban, apixaban, edoxaban) or a direct thrombin inhibitor (e.g., dabigatran) anticoagulant treatment due to assay interference by these drugs. Performed At: [facility] [place], [place] [id number] Rush Farmer MD [id number]    Heparin Unfractionated 12/22/2018 0.45  0.30 - 0.70 IU/mL Final   Comment: (NOTE) If heparin results are below expected values, and patient dosage has  been confirmed, suggest follow up testing of antithrombin III levels. Performed at [facility], [address] [place], [place] [address]    Prothrombin Time 12/23/2018 15.8* 11.4 - 15.2 seconds Final   INR 12/23/2018 1.3* 0.8 - 1.2 Final   Comment: (NOTE) INR goal varies based on device and disease states. Performed at [facility], [address] [place], [place] [address]    WBC 12/23/2018 9.0  4.0 - 10.5 K/uL Final   RBC 12/23/2018 3.83* 3.87 - 5.11 MIL/uL Final   Hemoglobin 12/23/2018 10.7* 12.0 - 15.0 g/dL Final   HCT 12/23/2018 33.5* 36.0 - 46.0 % Final   MCV 12/23/2018 87.5  80.0 - 100.0 fL Final   MCH 12/23/2018 27.9  26.0 - 34.0 pg Final   MCHC 12/23/2018 31.9  30.0 - 36.0 g/dL Final   RDW 12/23/2018 13.3  11.5 - 15.5 % Final   Platelets 12/23/2018 359  150 - 400 K/uL Final   nRBC 12/23/2018 0.0  0.0 - 0.2 % Final   Performed at [facility], [place], [place] [address]   Heparin Unfractionated  12/23/2018 0.50  0.30 - 0.70 IU/mL Final   Comment: (NOTE) If heparin results are below expected values, and patient dosage has  been confirmed, suggest follow up testing of antithrombin III levels. Performed at [facility], [address] [place], [place] [address]    Troponin I 12/23/2018 <0.03  <0.03 ng/mL Final   Performed at [facility] [place], [place] [address]   Sodium 12/23/2018 136  135 - 145 mmol/L Final   Potassium 12/23/2018  3.9  3.5 - 5.1 mmol/L Final   Chloride 12/23/2018 115* 98 - 111 mmol/L Final   CO2 12/23/2018 16* 22 - 32 mmol/L Final   Glucose, Bld 12/23/2018 89  70 - 99 mg/dL Final   BUN 12/23/2018 14  6 - 20 mg/dL Final   Creatinine, Ser 12/23/2018 0.95  0.44 - 1.00 mg/dL Final   Calcium 12/23/2018 7.6* 8.9 - 10.3 mg/dL Final   Total Protein 12/23/2018 5.5* 6.5 - 8.1 g/dL Final   Albumin 12/23/2018 2.7* 3.5 - 5.0 g/dL Final   AST 12/23/2018 10* 15 - 41 U/L Final   ALT 12/23/2018 8  0 - 44 U/L Final   Alkaline Phosphatase 12/23/2018 40  38 - 126 U/L Final   Total Bilirubin 12/23/2018 0.2* 0.3 - 1.2 mg/dL Final   GFR calc non Af Amer 12/23/2018 >60  >60 mL/min Final   GFR calc Af Amer 12/23/2018 >60  >60 mL/min Final   Anion gap 12/23/2018 5  5 - 15 Final   Performed at [facility], [address] [place], [place] [address]   Magnesium 12/23/2018 2.2  1.7 - 2.4 mg/dL Final   Performed at [facility], [place], [place] [address]   PTT-LA Mix 12/22/2018 54.7* 0.0 - 48.9 sec Final   Comment: (NOTE) Performed At: [facility] [place], [place] [id number] Rush Farmer MD [id number]    Hexagonal Phase Phospholipid 12/22/2018 0  0 - 11 sec Final   Comment: (NOTE) Performed At: [facility] [place], [place] [id number] Rush Farmer MD [id number]     Assessment:  Angela [name] is a 43 y.o. 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  She is on Coumadin.  Work-up on 12/22/2018 included the following normal/negative labs: CBC with diff, lupus anticoagulant, protein C activity, and protein S activity/antigen.  Factor V Leiden was negative on 12/28/2018.  Chest CT angiogram on 12/20/2018 revealed a wedge-shaped opacity in the right lower lobe lateral basal segment consistent with a pulmonary infarct. There was relative hypoenhancement within the associated segmental artery probably secondary to a small pulmonary embolus, though it was difficult to assess given limitations of beam attenuation caused by the patient's body habitus.  There was a small right pleural effusion with associated atelectasis.  Bilateral lower extremity duplex on 12/21/2018 revealed no femoropopliteal and no calf DVT in the visualized calf veins.  Symptomatically, she notes shortness of breath with exertion.  She has LUQ pain of unclear etiology.  She has no guarding or rebound tenderness.  Plan: 1.   Labs today:  CBC with diff, CMP, factor V Leiden, prothrombin gene mutation, anticardiolipin antibodies, beta2-glycoprotein antibodies, PT/INR. 2.   Recurrent pulmonary embolism  Review initiation pulmonary embolism (left side) and management.   She was on Coumadin for 6-12 months.  Discuss current pulmonary embolism   Etiology unclear.   Weight likely a contributing factor.   Unclear autoimmune history.   Discuss work-up.  Current dose of Coumadin is 7.5 mg/day (total weekly dose 52.5 mg).   Goal INR 2-3.   Check INR today.   Discuss plan for life long anticoagulation. 3.   RUQ pain   Etiology unclear.  RUQ abdominal ultrasound on 12/20/2018 did not visualize this area.  She does not have a surgical abdomen.  Clinically, spleen is not enlarged.  Given patient's anxiety about symptoms, referral to [facility]. 4.   RTC in 2 weeks for MD assessment (  Doximity) and review of work-up.  Addendum:   INR today was 3.7.  Results available 01/07/2019.  Patient contacted.  Patient instructed to decrease her Coumadin by 10% to 7.5 mg 4 days/week and 5 mg 3 days/week (total weekly dose dose 45 mg).  She has a follow-up INR with Dr Iona Beard on 01/11/2019.  I discussed the assessment and treatment plan with the patient.  The patient was provided an opportunity to ask questions and all were answered.  The patient agreed with the plan and demonstrated an understanding of the instructions.  The patient was advised to call back if the symptoms worsen or if the condition fails to improve as anticipated.  I provided 40 minutes of face-to-face time during this this encounter and > 50% was spent counseling as documented under my assessment and plan.    Prestyn Mahn C. Mike Gip, MD, PhD    01/06/2019, 11:27 AM  I, Molly Dorshimer, am acting as Education administrator for [employer]. Mike Gip, MD, PhD.  I, Antony Sian C. Mike Gip, MD, have reviewed the above documentation for accuracy and completeness, and I agree with the above.

## 2019-01-06 ENCOUNTER — Inpatient Hospital Stay: Payer: Medicare Other

## 2019-01-06 ENCOUNTER — Ambulatory Visit
Admit: 2019-01-06 | Discharge: 2019-01-06 | Disposition: A | Discharge status: Home / Self Care | Payer: Medicare Other | Attending: Family Medicine | Admitting: Family Medicine

## 2019-01-06 ENCOUNTER — Encounter: Payer: Self-pay | Admitting: Emergency Medicine

## 2019-01-06 ENCOUNTER — Inpatient Hospital Stay: Payer: Medicare Other | Attending: Hematology and Oncology | Admitting: Hematology and Oncology

## 2019-01-06 ENCOUNTER — Ambulatory Visit: Payer: Medicare Other

## 2019-01-06 ENCOUNTER — Other Ambulatory Visit: Payer: Self-pay

## 2019-01-06 ENCOUNTER — Encounter: Payer: Self-pay | Admitting: Hematology and Oncology

## 2019-01-06 ENCOUNTER — Ambulatory Visit
Admission: EM | Admit: 2019-01-06 | Discharge: 2019-01-06 | Disposition: A | Discharge status: Home / Self Care | Payer: Medicare Other | Attending: Family Medicine | Admitting: Family Medicine

## 2019-01-06 VITALS — BP 143/93 | HR 65 | Temp 98.8°F | Resp 18 | Ht 63.0 in | Wt 290.0 lb

## 2019-01-06 DIAGNOSIS — I2699 Other pulmonary embolism without acute cor pulmonale: Secondary | ICD-10-CM | POA: Diagnosis present

## 2019-01-06 DIAGNOSIS — Z86718 Personal history of other venous thrombosis and embolism: Secondary | ICD-10-CM | POA: Insufficient documentation

## 2019-01-06 DIAGNOSIS — Z8249 Family history of ischemic heart disease and other diseases of the circulatory system: Secondary | ICD-10-CM | POA: Insufficient documentation

## 2019-01-06 DIAGNOSIS — R110 Nausea: Secondary | ICD-10-CM | POA: Diagnosis present

## 2019-01-06 DIAGNOSIS — I10 Essential (primary) hypertension: Secondary | ICD-10-CM

## 2019-01-06 DIAGNOSIS — J45909 Unspecified asthma, uncomplicated: Secondary | ICD-10-CM | POA: Diagnosis not present

## 2019-01-06 DIAGNOSIS — R1012 Left upper quadrant pain: Secondary | ICD-10-CM | POA: Insufficient documentation

## 2019-01-06 DIAGNOSIS — Z809 Family history of malignant neoplasm, unspecified: Secondary | ICD-10-CM | POA: Insufficient documentation

## 2019-01-06 DIAGNOSIS — R197 Diarrhea, unspecified: Secondary | ICD-10-CM | POA: Diagnosis present

## 2019-01-06 DIAGNOSIS — Z79899 Other long term (current) drug therapy: Secondary | ICD-10-CM | POA: Insufficient documentation

## 2019-01-06 DIAGNOSIS — Z7901 Long term (current) use of anticoagulants: Secondary | ICD-10-CM

## 2019-01-06 DIAGNOSIS — R1011 Right upper quadrant pain: Secondary | ICD-10-CM | POA: Insufficient documentation

## 2019-01-06 LAB — PROTIME-INR
INR: 3.7 — ABNORMAL HIGH (ref 0.8–1.2)
Prothrombin Time: 35.8 seconds — ABNORMAL HIGH (ref 11.4–15.2)

## 2019-01-06 LAB — COMPREHENSIVE METABOLIC PANEL
ALT: 20 U/L (ref 0–44)
AST: 16 U/L (ref 15–41)
Albumin: 3.8 g/dL (ref 3.5–5.0)
Alkaline Phosphatase: 54 U/L (ref 38–126)
Anion gap: 8 (ref 5–15)
BUN: 9 mg/dL (ref 6–20)
CO2: 17 mmol/L — ABNORMAL LOW (ref 22–32)
Calcium: 8.5 mg/dL — ABNORMAL LOW (ref 8.9–10.3)
Chloride: 111 mmol/L (ref 98–111)
Creatinine, Ser: 0.99 mg/dL (ref 0.44–1.00)
GFR calc Af Amer: >60 mL/min (ref >60)
GFR calc non Af Amer: >60 mL/min (ref >60)
Glucose, Bld: 97 mg/dL (ref 70–99)
Potassium: 3.6 mmol/L (ref 3.5–5.1)
Sodium: 136 mmol/L (ref 135–145)
Total Bilirubin: 0.6 mg/dL (ref 0.3–1.2)
Total Protein: 7.2 g/dL (ref 6.5–8.1)

## 2019-01-06 LAB — CBC WITH DIFFERENTIAL/PLATELET
Abs Immature Granulocytes: 0.03 10*3/uL (ref 0.00–0.07)
Basophils Absolute: 0.1 10*3/uL (ref 0.0–0.1)
Basophils Relative: 1 %
Eosinophils Absolute: 0.2 10*3/uL (ref 0.0–0.5)
Eosinophils Relative: 3 %
HCT: 38.4 % (ref 36.0–46.0)
Hemoglobin: 13 g/dL (ref 12.0–15.0)
Immature Granulocytes: 0 %
Lymphocytes Relative: 25 %
Lymphs Abs: 2.2 10*3/uL (ref 0.7–4.0)
MCH: 28.8 pg (ref 26.0–34.0)
MCHC: 33.9 g/dL (ref 30.0–36.0)
MCV: 85.1 fL (ref 80.0–100.0)
Monocytes Absolute: 0.5 10*3/uL (ref 0.1–1.0)
Monocytes Relative: 6 %
Neutro Abs: 5.8 10*3/uL (ref 1.7–7.7)
Neutrophils Relative %: 65 %
Platelets: 368 10*3/uL (ref 150–400)
RBC: 4.51 MIL/uL (ref 3.87–5.11)
RDW: 14 % (ref 11.5–15.5)
WBC: 8.8 10*3/uL (ref 4.0–10.5)
nRBC: 0 % (ref 0.0–0.2)

## 2019-01-06 MED ORDER — ONDANSETRON 8 MG PO TBDP: 8.0000 mg | ORAL_TABLET | Freq: Three times a day (TID) | ORAL | 0 refills | Status: DC | PRN

## 2019-01-06 NOTE — Discharge Instructions (Addendum)
Clear liquids then advance diet slowly as tolerated Go to Emergency Department if symptoms worsen over the weekend

## 2019-01-06 NOTE — ED Triage Notes (Signed)
Patient c/o LUQ abdominal pain that started 3 days. Patient reports diarrhea for the past 3 days. Patient denies fevers.

## 2019-01-06 NOTE — Progress Notes (Signed)
Pt here as new referral for PE. Reports SOB with lying down and LUQ pain that radiates to back. Also reports cramping behind left knee x 2 days.   Appt. Vascular June 19 for consult for filter placement.   INR checks on Wednesday per patient

## 2019-01-07 ENCOUNTER — Telehealth: Payer: Self-pay | Admitting: Hematology and Oncology

## 2019-01-07 LAB — CARDIOLIPIN ANTIBODIES, IGG, IGM, IGA
Anticardiolipin IgA: <9 APL U/mL (ref 0–11)
Anticardiolipin IgG: 11 GPL U/mL (ref 0–14)
Anticardiolipin IgM: <9 MPL U/mL (ref 0–12)

## 2019-01-07 MED ORDER — WARFARIN SODIUM 5 MG PO TABS: ORAL_TABLET | ORAL | 0 refills | Status: DC

## 2019-01-07 NOTE — Telephone Encounter (Signed)
Re:  INR  Called patient regarding INR from yesterday.  INR was 3.7.  Discussed the plan to decrease her Coumadin from 7.5 mg a day to 5 mg alternating with 7.5 mg every other day.    A new Rx for 5 mg pills was escribed.  I discussed plan for follow-up INR next week.  She has an appointment on 01/11/2019 at Dr Cleon Dew office.  I discussed her evaluation at the [facility].  KUB revealed no nephrolithiasis.  Abdominal ultrasound revealed a normal spleen.  She remains uncomfortable.  She is not interested in going to the ER.  I strongly encouraged her to seek medical attention if her symptoms worsen.  We discussed importance of eating.  She denies any diarrhea.  I again reviewed the issues with vitamin K and how it affects the INR.  She denies any excess bruising or bleeding.  I encouraged her to contact the clinic if she has any concerns.   Lequita Asal, MD

## 2019-01-09 NOTE — ED Provider Notes (Signed)
[facility]    CSN: [id number] Arrival date & time: 01/06/19  1220     History   Chief Complaint Chief Complaint  Patient presents with  . Abdominal Pain    LUQ     HPI Angela [name] is a 43 y.o. female.   43 yo female with a h/o recent PE presents with a c/o left upper abdominal pain for the past 3 days associated with some diarrhea. Patient is on anticoagulants. Denies any fevers, chills, vomiting, melena, hematochezia, fall/injury, chest pain, shortness of breath.   Had labs ordered today but oncologist (CMP, CBC normal).    Abdominal Pain    Past Medical History:  Diagnosis Date  . Anemia   . Anxiety   . Depression   . Fibromyalgia   . GERD (gastroesophageal reflux disease)   . Hypertension   . Irritable bowel syndrome (IBS)   . Pseudotumor cerebri   . Raynaud's disease   . Thyroid disease    hypothyroid  . Vision disturbance     Patient Active Problem List   Diagnosis Date Noted  . Long term current use of anticoagulant therapy 01/06/2019  . Pulmonary embolism with infarction ([place]) 12/20/2018  . Nipple discharge in female 11/27/2016  . Other shoulder lesions, unspecified shoulder 07/22/2015  . Clinical depression 06/18/2015  . Headache, migraine 06/18/2015  . 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 Mass of breast, right 05/24/2014  . Lump or mass in breast 05/29/2013  . Breast lump 05/29/2013  . Essential (primary) hypertension 04/26/2013  . Cephalalgia 04/26/2013  . Fibroid 04/26/2013  . Muscle ache 04/26/2013  . Benign intracranial hypertension 12/08/2012  . Increased prolactin level ([place]) 12/08/2012  . Anxiety state 06/12/2011    Past Surgical History:  Procedure Laterality Date  . ABDOMINAL HYSTERECTOMY  02/06/13  . BREAST BIOPSY Right October, 2014   right breast core biopsy, fibroadenomatous changes  . BREAST BIOPSY Right December 2014   FNA retroareolar nodule consistent with fibroadenoma.  Marland Kitchen BREAST BIOPSY Right 12/16/2016   Fibroadenoma in the retroareolar area at 6:00.  Marland Kitchen [place]  . COLONOSCOPY  2015   Dr Allen Norris  . RHINOPLASTY    . TONSILECTOMY, ADENOIDECTOMY, BILATERAL MYRINGOTOMY AND TUBES  2004  . UPPER GASTROINTESTINAL ENDOSCOPY  2015   Dr Allen Norris    OB History    Gravida  3   Para      Term      Preterm      AB  3   Living  3     SAB  3   TAB      Ectopic      Multiple      Live Births           Obstetric Comments  Menstrual age:   Age 1st Pregnancy: 60         Home Medications    Prior to Admission medications   Medication Sig Start Date End Date Taking? Authorizing Provider  acetaZOLAMIDE (DIAMOX) 250 MG tablet Take 650 mg by mouth 3 (three) times daily.   Yes [provider]  albuterol (PROVENTIL HFA;VENTOLIN HFA) 108 (90 BASE) MCG/ACT inhaler Inhale 2 puffs into the lungs every 6 (six) hours as needed for wheezing.   Yes [provider]  Biotin 1000 MCG tablet Take 1,000 mcg by mouth 4 (four) times a week.   Yes [provider]  Buprenorphine  HCl (BELBUCA) 150 MCG FILM Place 150 mcg inside cheek daily. 10/11/18  Yes [provider]  buPROPion (WELLBUTRIN SR) 150 MG 12 hr tablet Take 150 mg by mouth daily.   Yes [provider]  butalbital-acetaminophen-caffeine (FIORICET) 50-325-40 MG tablet Take 1 tablet by mouth as needed. 11/17/16  Yes [provider]  clonazePAM (KLONOPIN) 0.5 MG tablet Take 1 tablet by mouth as needed. 03/27/14  Yes [provider]  cyclobenzaprine (FLEXERIL) 10 MG tablet Take 1 tablet by mouth as needed.   Yes [provider]  escitalopram (LEXAPRO) 20 MG tablet Take 20 mg by mouth daily.   Yes [provider]  linaclotide (LINZESS) 145 MCG CAPS capsule Take 1 capsule (145 mcg total) by mouth daily before breakfast. Patient taking differently: Take 145 mcg  by mouth as needed.  11/16/16  Yes Lucilla Lame, MD  Melatonin 5 MG CAPS Take 1 tablet by mouth at bedtime.   Yes [provider]  omeprazole (PRILOSEC) 20 MG capsule Take 20 mg by mouth every other day.    Yes [provider]  ondansetron (ZOFRAN) 8 MG tablet Take 8 mg by mouth every 8 (eight) hours as needed for nausea or vomiting.   Yes [provider]  rosuvastatin (CRESTOR) 20 MG tablet Take 20 mg by mouth daily.   Yes [provider]  topiramate (TOPAMAX) 100 MG tablet Take 100 mg by mouth 2 (two) times daily.    Yes [provider]  warfarin (COUMADIN) 7.5 MG tablet Take 1 tablet (7.5 mg total) by mouth daily. 12/23/18 12/23/19 Yes Epifanio Lesches, MD  Difluprednate (DUREZOL) 0.05 % EMUL Durezol 0.05 % eye drops  PLACE 1 DROP INTO RIGHT EYE EVERY DAY FOR 7 DAYS    [provider]  diphenhydrAMINE (BENADRYL) 25 mg capsule Take 25 mg by mouth every 6 (six) hours as needed.     [provider]  Docusate Sodium 100 MG capsule Take 100 mg by mouth 2 (two) times daily as needed.     [provider]  EPINEPHrine (EPIPEN 2-PAK) 0.3 mg/0.3 mL IJ SOAJ injection EpiPen 2-Pak 0.3 mg/0.3 mL injection, auto-injector  INJECT 0.3 MLS (0.3 MG TOTAL) INTO THE MUSCLE ONCE AS NEEDED FOR ANAPHYLAXIS FOR UP TO 1 DOSE.    [provider]  Erenumab-aooe (AIMOVIG, 140 MG DOSE,) 70 MG/ML SOAJ Inject 1 Dose into the skin every 28 (twenty-eight) days. 01/25/18   [provider]  fluticasone (FLONASE) 50 MCG/ACT nasal spray 2 sprays by Each Nare route daily. 02/20/15 02/20/16  [provider]  levothyroxine (SYNTHROID) 150 MCG tablet Take 150 mcg by mouth daily before breakfast.     [provider]  ondansetron (ZOFRAN ODT) 8 MG disintegrating tablet Take 1 tablet (8 mg total) by mouth every 8 (eight) hours as needed. 01/06/19   Norval Gable, MD  oxybutynin (DITROPAN-XL) 10 MG 24 hr tablet Take 10 mg by mouth at  bedtime.  01/25/18   [provider]  prednisoLONE acetate (PRED FORTE) 1 % ophthalmic suspension Place 1 drop into both eyes every 4 (four) hours.    [provider]  promethazine (PHENERGAN) 25 MG tablet Take 25 mg by mouth every 6 (six) hours as needed for nausea or vomiting.    [provider]  sodium fluoride (DENTAGEL) 1.1 % GEL dental gel DentaGel 1.1 %  AFTER BRUSHING AND FLOSSING BRUSH WITH GEL FOR 3 MINUTES ONCE A DAY, DO NOT EAT,DRINK FOR 30 MINUTES  [provider]  tiZANidine (ZANAFLEX) 4 MG tablet Take 4 mg by mouth every 8 (eight) hours as needed for muscle spasms.    [provider]  warfarin (COUMADIN) 5 MG tablet Take 5 mg every other day alternating with 7.5 mg. 01/07/19   Lequita Asal, MD    Family History Family History  Problem Relation Age of Onset  . Depression Mother   . Migraines Mother   . Diverticulitis Mother   . Hypertension Mother   . Heart disease Father   . Diabetes Father   . Hypertension Father   . Stroke Sister   . Cancer Maternal Aunt   . Cancer Maternal Grandmother     Social History Social History   Tobacco Use  . Smoking status: Never Smoker  . Smokeless tobacco: Never Used  Substance Use Topics  . Alcohol use: Yes    Comment: Occasionally Social Drinker  . Drug use: No     Allergies   Amoxicillin; Lisinopril; Nortriptyline; Other; Penicillins; and Sulfa antibiotics   Review of Systems Review of Systems  Gastrointestinal: Positive for abdominal pain.     Physical Exam Triage Vital Signs ED Triage Vitals  Enc Vitals Group     BP 01/06/19 1325 (!) 147/105     Pulse Rate 01/06/19 1325 75     Resp 01/06/19 1325 16     Temp 01/06/19 1325 98.5 F (36.9 C)     Temp Source 01/06/19 1325 Oral     SpO2 01/06/19 1325 100 %     Weight 01/06/19 1322 287 lb (130.2 kg)     Height 01/06/19 1322 5\' 3"  (1.6 m)     Head Circumference --      Peak Flow --      Pain Score 01/06/19 1322  7     Pain Loc --      Pain Edu? --      Excl. in [place]? --    No data found.  Updated Vital Signs BP (!) 147/105 (BP Location: Left Arm)   Pulse 75   Temp 98.5 F (36.9 C) (Oral)   Resp 16   Ht 5\' 3"  (1.6 m)   Wt 130.2 kg   SpO2 100%   BMI 50.84 kg/m   Visual Acuity Right Eye Distance:   Left Eye Distance:   Bilateral Distance:    Right Eye Near:   Left Eye Near:    Bilateral Near:     Physical Exam Vitals signs and nursing note reviewed.  Constitutional:      General: She is not in acute distress.    Appearance: She is not toxic-appearing or diaphoretic.  Abdominal:     General: Bowel sounds are normal. There is no distension.     Palpations: Abdomen is soft. There is no mass.     Tenderness: There is abdominal tenderness (left sided). There is no right CVA tenderness, left CVA tenderness, guarding or rebound.     Hernia: No hernia is present.  Neurological:     Mental Status: She is alert.      UC Treatments / Results  Labs (all labs ordered are listed, but only abnormal results are displayed) Labs Reviewed - No data to display  EKG None  Radiology No results found.  Procedures Procedures (including critical care time)  Medications Ordered in UC Medications - No data to display  Initial Impression / Assessment and Plan / UC Course  I have reviewed the triage vital signs and  the nursing notes.  Pertinent labs & imaging results that were available during my care of the patient were reviewed by me and considered in my medical decision making (see chart for details).      Final Clinical Impressions(s) / UC Diagnoses   Final diagnoses:  Abdominal pain, left upper quadrant  Nausea  Diarrhea, unspecified type     Discharge Instructions     Clear liquids then advance diet slowly as tolerated Go to Emergency Department if symptoms worsen over the weekend    ED Prescriptions    Medication Sig Dispense Auth. Provider   ondansetron (ZOFRAN  ODT) 8 MG disintegrating tablet Take 1 tablet (8 mg total) by mouth every 8 (eight) hours as needed. 8 tablet Norval Gable, MD      1. Labs/x-ray results and diagnosis reviewed with patient 2. rx as per orders above; reviewed possible side effects, interactions, risks and benefits  3. Recommend supportive treatment as above 4. Follow-up prn   Controlled Substance Prescriptions [place] Controlled Substance Registry consulted? Not Applicable   Norval Gable, MD 01/09/19 1840

## 2019-01-12 LAB — PROTHROMBIN GENE MUTATION

## 2019-01-12 LAB — FACTOR 5 LEIDEN

## 2019-01-13 ENCOUNTER — Other Ambulatory Visit: Payer: Self-pay | Admitting: Family Medicine

## 2019-01-13 DIAGNOSIS — R1013 Epigastric pain: Secondary | ICD-10-CM

## 2019-01-13 LAB — BETA-2-GLYCOPROTEIN I ABS, IGG/M/A
Beta-2 Glyco I IgG: <9 GPI IgG units (ref 0–20)
Beta-2-Glycoprotein I IgA: <9 GPI IgA units (ref 0–25)
Beta-2-Glycoprotein I IgM: <9 GPI IgM units (ref 0–32)

## 2019-01-16 ENCOUNTER — Other Ambulatory Visit: Payer: Self-pay | Admitting: Family Medicine

## 2019-01-16 ENCOUNTER — Other Ambulatory Visit: Payer: Self-pay

## 2019-01-16 ENCOUNTER — Ambulatory Visit
Admission: RE | Admit: 2019-01-16 | Discharge: 2019-01-16 | Disposition: A | Discharge status: Home / Self Care | Payer: Medicare Other | Source: Ambulatory Visit | Attending: Family Medicine | Admitting: Family Medicine

## 2019-01-16 DIAGNOSIS — R1013 Epigastric pain: Secondary | ICD-10-CM | POA: Diagnosis present

## 2019-01-16 HISTORY — DX: Unspecified asthma, uncomplicated: J45.909

## 2019-01-16 IMAGING — CT CT ABDOMEN AND PELVIS WITH CONTRAST
1 of 3 series · 14 of 32 positions shown, 19 images · IV contrast (APPLIED)
Comparison: Noncontrast CT on [DATE] from [HOSPITAL]

CLINICAL DATA: Epigastric and left upper quadrant pain for 2 weeks.

EXAM:
CT ABDOMEN AND PELVIS WITH CONTRAST
TECHNIQUE: Multidetector CT imaging of the abdomen and pelvis was performed
using the standard protocol following bolus administration of
intravenous contrast.
CONTRAST:  100mL [1N] IOPAMIDOL ([1N]) INJECTION 61%

[Series 2: axial st · axial · 0.82mm/px · z∈[-910,-510]mm · 14 of 90 slices shown, 19 images]
[im 5/90  soft-tissue]
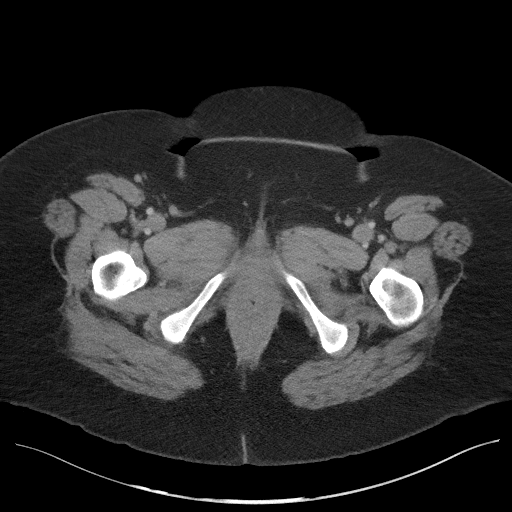
[im 5/90  bone]
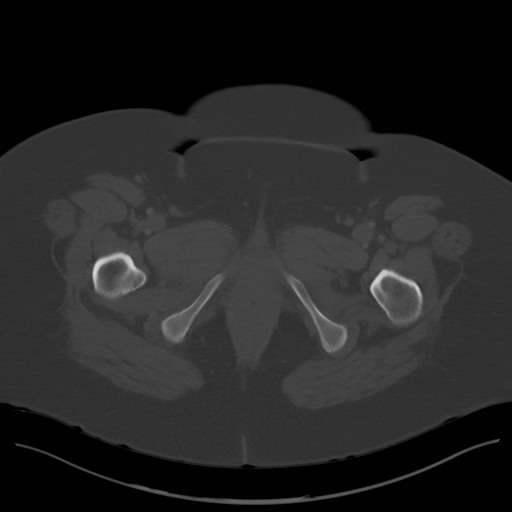
[im 15/90  soft-tissue]
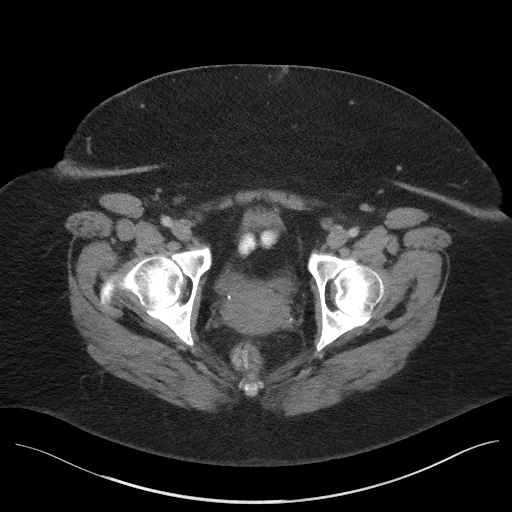
[im 19/90  soft-tissue]
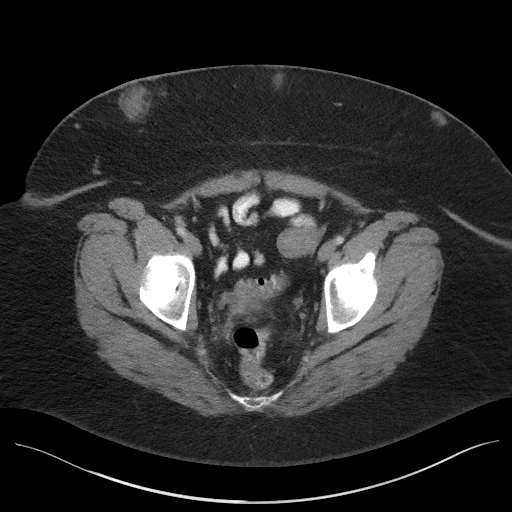
[im 24/90  soft-tissue]
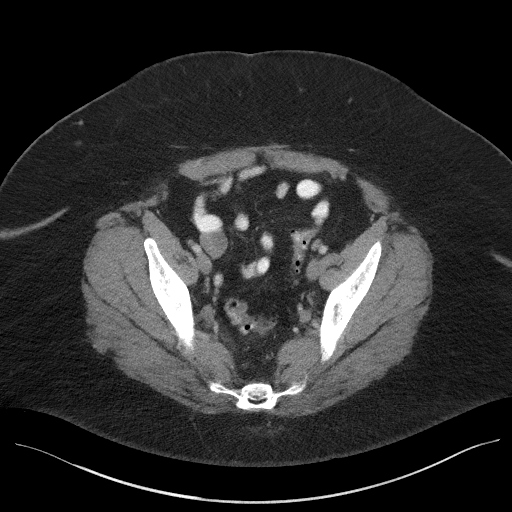
[im 33/90  soft-tissue]
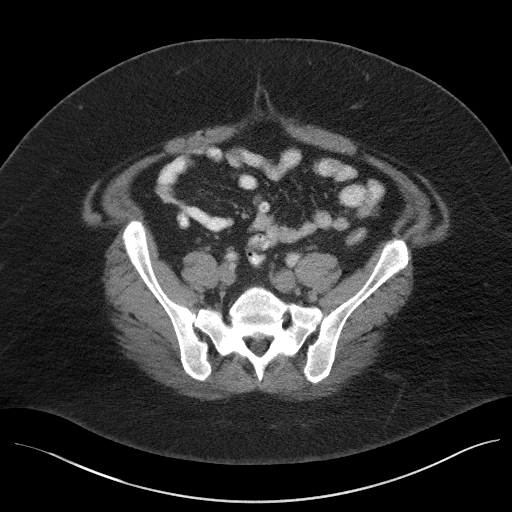
[im 38/90  soft-tissue]
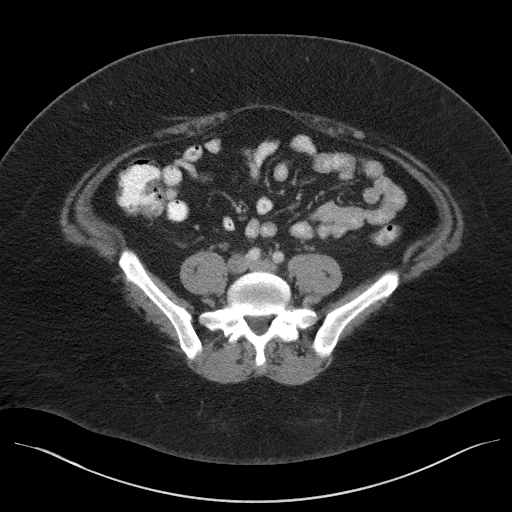
[im 47/90  soft-tissue]
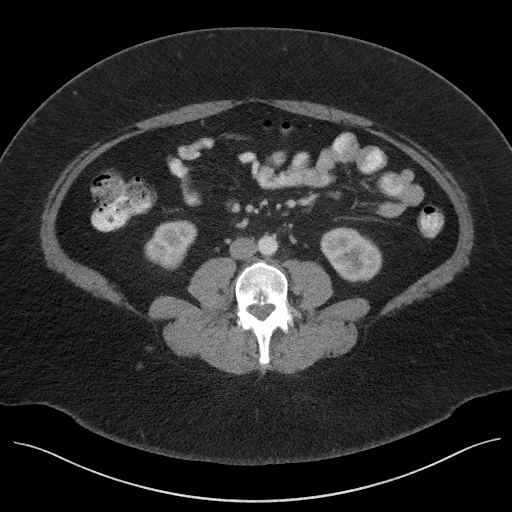
[im 52/90  soft-tissue]
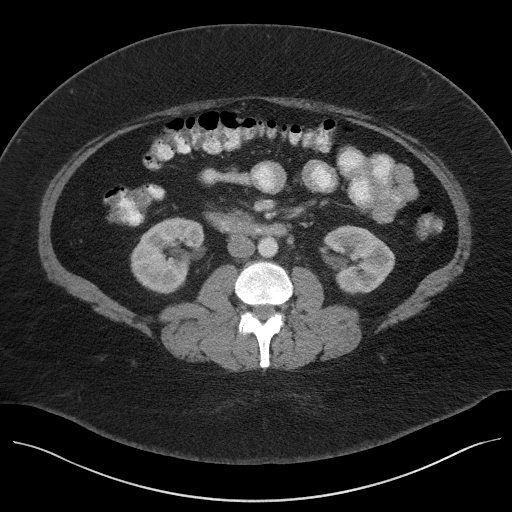
[im 57/90  soft-tissue]
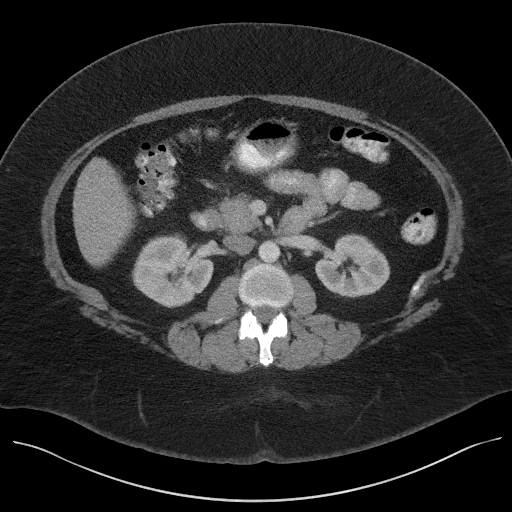
[im 57/90  bone]
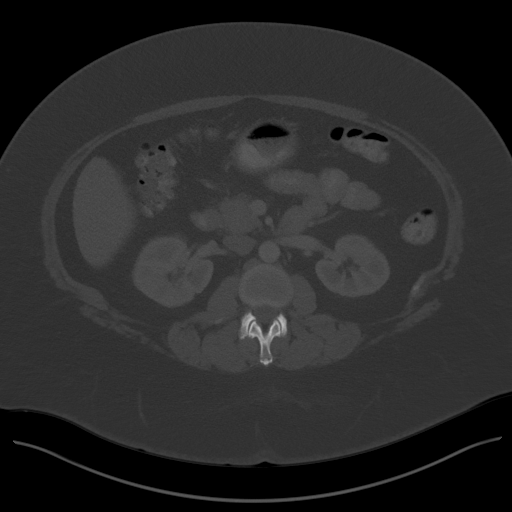
[im 66/90  soft-tissue]
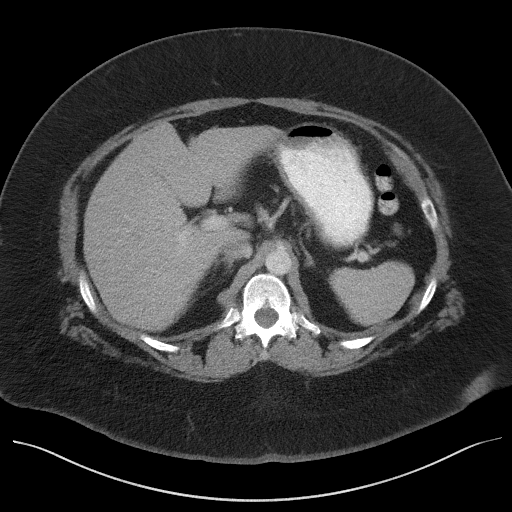
[im 71/90  soft-tissue]
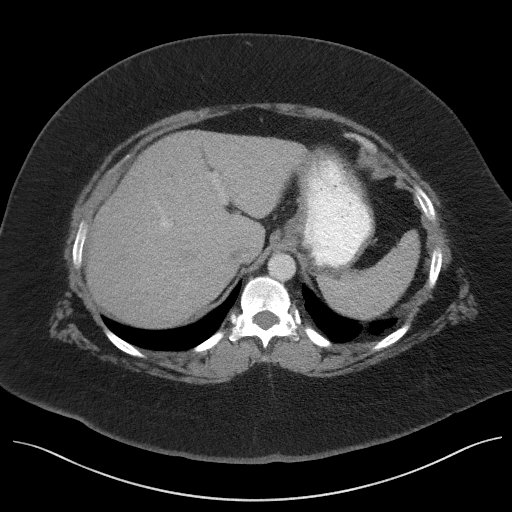
[im 71/90  lung]
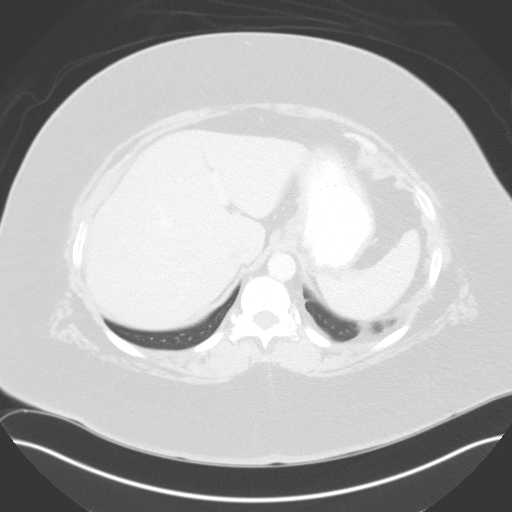
[im 75/90  soft-tissue]
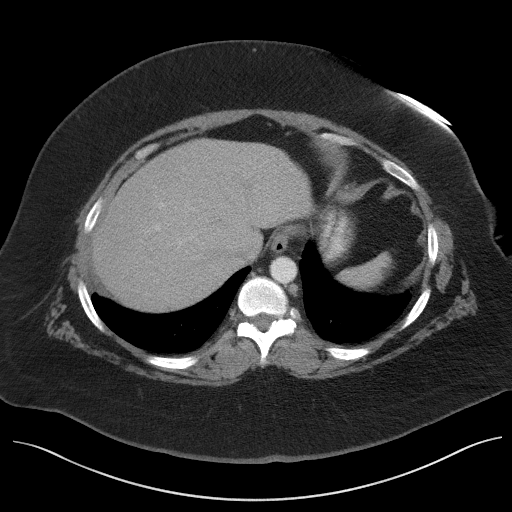
[im 75/90  lung]
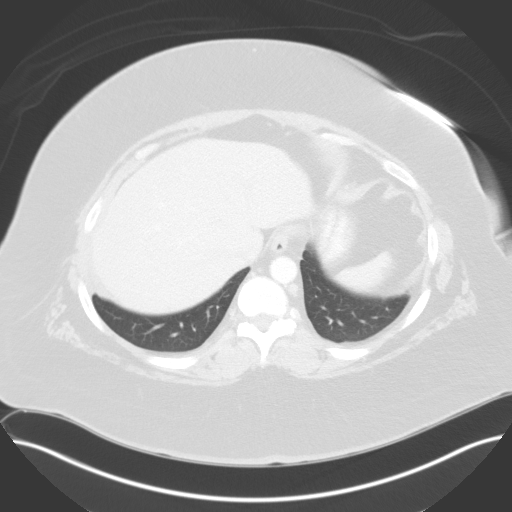
[im 80/90  lung]
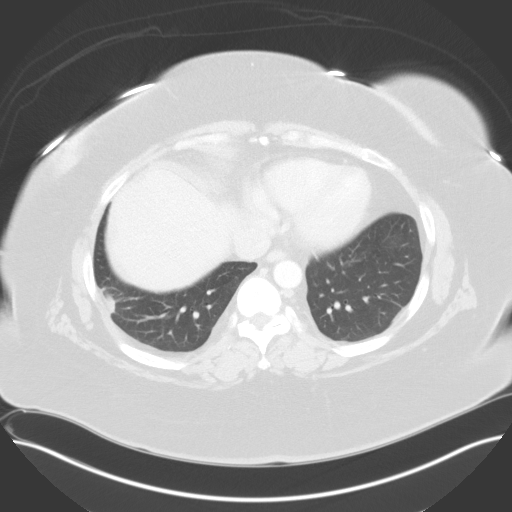
[im 85/90  soft-tissue]
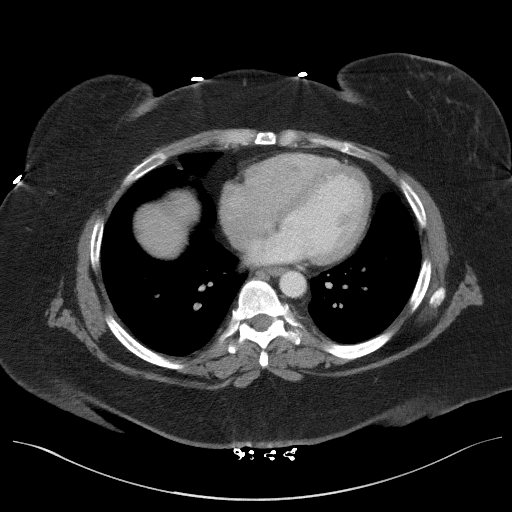
[im 85/90  lung]
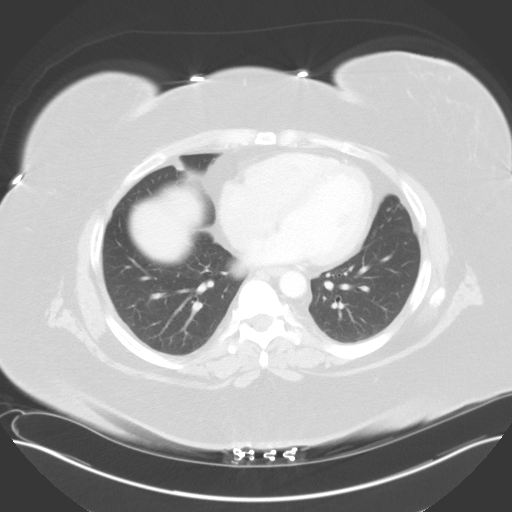

[14 of 32 positions shown; findings below may reference images not displayed]

FINDINGS: Lower Chest: 2.0 cm pleural-based nodule in the posterior right
lower lobe is new since previous study.

Hepatobiliary: No hepatic masses identified. Gallbladder is
unremarkable.

Pancreas:  No mass or inflammatory changes.

Spleen: Within normal limits in size and appearance.

Adrenals/Urinary Tract: No masses identified. No evidence of
hydronephrosis. Unremarkable unopacified urinary bladder.

Stomach/Bowel: No evidence of obstruction, inflammatory process or
abnormal fluid collections. Normal appendix visualized.

Vascular/Lymphatic: No pathologically enlarged lymph nodes. No
abdominal aortic aneurysm.

Reproductive: Prior hysterectomy noted. Adnexal regions are
unremarkable in appearance.

Other: Injection granulomas noted in subcutaneous tissues of
anterior abdominal wall.

Musculoskeletal:  No suspicious bone lesions identified.
IMPRESSION: 1. No acute findings within the abdomen or pelvis.
2. New 2 cm pleural-based nodule in posterior right lower lobe.
PET-CT scan is recommended for further evaluation.

## 2019-01-16 MED ORDER — IOPAMIDOL (ISOVUE-300) INJECTION 61%
100.0000 mL | Freq: Once | INTRAVENOUS | Status: AC | PRN
  Administered 2019-01-16: 100 mL via INTRAVENOUS

## 2019-01-18 ENCOUNTER — Other Ambulatory Visit: Payer: Self-pay

## 2019-01-18 NOTE — Progress Notes (Signed)
[facility]  [address], [address] [place], [place] [address] Phone: [phone]  Fax: [phone]   Telemedicine Office Visit:  01/19/2019  Referring physician: Sharyne Peach, MD  I connected with Angela [name] on 01/19/2019 at 10:37 AM by videoconferencing and verified that I was speaking with the correct person using 2 identifiers.  The patient was at home.  I discussed the limitations, risk, security and privacy concerns of performing an evaluation and management service by videoconferencing and the availability of in person appointments.  I also discussed with the patient that there may be a patient responsible charge related to this service.  The patient expressed understanding and agreed to proceed.   Chief Complaint: Angela [name] is a 43 y.o. female with recurrent pulmonary embolism who is seen for review of work-up and discussion regarding direction of therapy.   HPI:  The patient was last seen in the hematology clinic on 01/06/2019 for initial consultation.  At that time, she noted shortness of breath with exertion. She had LUQ pain of unclear etiology.  She had no guarding or rebound tenderness.  She was referred to the [facility].  She was seen in the [facility] on 01/06/2019 with abdominal pain. Abdominal plain films showed no radiopaque nephrolithiasis and nonobstructive bowel gas pattern. Abdominal ultrasound showed a normal spleen. No lesions were seen in the perisplenic region.  Work-up on 01/06/2019 revealed the following normal labs: Factor V Leiden, prothrombin gene mutation, anticardiolipin antibodies, and beta2-glycoprotein antibodies.  INR was 3.7 on 01/07/2019. Patient was contacted to decrease her Coumadin by 10% to 7.5 mg 4 days/week and 5 mg 3 days/week (total weekly dose dose 45 mg).  She was to have a follow-up INR with Dr. Iona Beard on 01/11/2019.  INR was 4.4 on 01/11/2019 and 1.9 on  01/18/2019.   Abdomen and pelvis CT on 01/16/2019 revealed no acute findings. There was a new 2 cm pleural-based nodule in posterior right lower lobe. PET-CT scan was recommended for further evaluation.  She was seen by Dr. Iona Beard on 01/18/2019. Her Coumadin was switched to 5mg  daily, with 7.5mg  once a week.  INR was 1.9 and she was recommended to continue at the current dosage.  Her next INR will be checked on 01/25/2019. She was referred to vascular surgery at [facility] on 01/27/2019 for consideration of an IVC filter.  During the interim, the patient notes "I'm okay." She continues to have left upper abdominal pain. She has been more active, and reports severe fatigue walking to the mail box. She has been sleeping in a recliner, and notes waking up in the middle of the night coughing.  She denies sleep apnea.   She has pressure with urination, noting "I have to push really hard to get all the urine out." She was previously in bladder therapy.   She continues to have leg cramps, back pain, and joint pain in her knees and hips.   Her weight fluctuates daily. She wishes to undergo bariatric surgery, but notes that Dr. Iona Beard does not think it is currently a safe option. She is interested in talking with a nutritionist.   She denies any new medications, herbal products, or diarrhea.    Past Medical History:  Diagnosis Date  . Anemia   . Anxiety   . Asthma   . Depression   . Fibromyalgia   . GERD (gastroesophageal reflux disease)   . Hypertension   . Irritable bowel syndrome (IBS)   .  Pseudotumor cerebri   . Raynaud's disease   . Thyroid disease    hypothyroid  . Vision disturbance     Past Surgical History:  Procedure Laterality Date  . ABDOMINAL HYSTERECTOMY  02/06/13  . BREAST BIOPSY Right October, 2014   right breast core biopsy, fibroadenomatous changes  . BREAST BIOPSY Right December 2014   FNA retroareolar nodule consistent with fibroadenoma.  Marland Kitchen BREAST BIOPSY Right 12/16/2016    Fibroadenoma in the retroareolar area at 6:00.  Marland Kitchen [place]  . COLONOSCOPY  2015   Dr Allen Norris  . RHINOPLASTY    . TONSILECTOMY, ADENOIDECTOMY, BILATERAL MYRINGOTOMY AND TUBES  2004  . UPPER GASTROINTESTINAL ENDOSCOPY  2015   Dr Allen Norris    Family History  Problem Relation Age of Onset  . Depression Mother   . Migraines Mother   . Diverticulitis Mother   . Hypertension Mother   . Heart disease Father   . Diabetes Father   . Hypertension Father   . Stroke Sister   . Cancer Maternal Aunt   . Cancer Maternal Grandmother     Social History:  reports that she has never smoked. She has never used smokeless tobacco. She reports current alcohol use. She reports that she does not use drugs. She denies drug use. She drinks alcohol occasionally, in social settings. She has 3 children and 3 grandchildren. She lives in [place].  The patient is alone today.  Participants in the patient's visit and their role in the encounter included the patient and Waymon Budge, RN today.  The intake visit was provided by Waymon Budge, RN.  Allergies:  Allergies  Allergen Reactions  . Amoxicillin Hives  . Lisinopril Cough  . Nortriptyline   . Other     Sulfa eye drops- Scratched corneas  . Penicillins Hives  . Sulfa Antibiotics     Current Medications: Current Outpatient Medications  Medication Sig Dispense Refill  . acetaZOLAMIDE (DIAMOX) 250 MG tablet Take 650 mg by mouth 3 (three) times daily.    Marland Kitchen albuterol (PROVENTIL HFA;VENTOLIN HFA) 108 (90 BASE) MCG/ACT inhaler Inhale 2 puffs into the lungs every 6 (six) hours as needed for wheezing.    . Biotin 1000 MCG tablet Take 1,000 mcg by mouth 4 (four) times a week.    . Buprenorphine HCl (BELBUCA) 150 MCG FILM Place 150 mcg inside cheek daily.    Marland Kitchen buPROPion (WELLBUTRIN SR) 150 MG 12 hr tablet Take 150 mg by mouth daily.    . cyclobenzaprine (FLEXERIL) 10 MG tablet Take 1 tablet by mouth as needed.    .  Difluprednate (DUREZOL) 0.05 % EMUL Durezol 0.05 % eye drops  PLACE 1 DROP INTO RIGHT EYE EVERY DAY FOR 7 DAYS    . EPINEPHrine (EPIPEN 2-PAK) 0.3 mg/0.3 mL IJ SOAJ injection EpiPen 2-Pak 0.3 mg/0.3 mL injection, auto-injector  INJECT 0.3 MLS (0.3 MG TOTAL) INTO THE MUSCLE ONCE AS NEEDED FOR ANAPHYLAXIS FOR UP TO 1 DOSE.    Marland Kitchen Erenumab-aooe (AIMOVIG, 140 MG DOSE,) 70 MG/ML SOAJ Inject 1 Dose into the skin every 28 (twenty-eight) days.    Marland Kitchen levothyroxine (SYNTHROID) 150 MCG tablet Take 150 mcg by mouth daily before breakfast.     . linaclotide (LINZESS) 145 MCG CAPS capsule Take 1 capsule (145 mcg total) by mouth daily before breakfast. 90 capsule 3  . Melatonin 5 MG CAPS Take 1 tablet by mouth at bedtime.    Marland Kitchen omeprazole (PRILOSEC) 20 MG capsule Take 20 mg by  mouth every other day.     . ondansetron (ZOFRAN) 8 MG tablet Take 8 mg by mouth every 8 (eight) hours as needed for nausea or vomiting.    Marland Kitchen oxybutynin (DITROPAN-XL) 10 MG 24 hr tablet Take 10 mg by mouth at bedtime.     . promethazine (PHENERGAN) 25 MG tablet Take 25 mg by mouth every 6 (six) hours as needed for nausea or vomiting.    . rosuvastatin (CRESTOR) 20 MG tablet Take 20 mg by mouth daily.    Marland Kitchen tiZANidine (ZANAFLEX) 4 MG tablet Take 4 mg by mouth every 8 (eight) hours as needed for muscle spasms.    Marland Kitchen topiramate (TOPAMAX) 100 MG tablet Take 100 mg by mouth 2 (two) times daily.     Marland Kitchen warfarin (COUMADIN) 5 MG tablet Take 5 mg every other day alternating with 7.5 mg. 30 tablet 0  . warfarin (COUMADIN) 7.5 MG tablet Take 1 tablet (7.5 mg total) by mouth daily. 30 tablet 11  . butalbital-acetaminophen-caffeine (FIORICET) 50-325-40 MG tablet Take 1 tablet by mouth as needed.    . clonazePAM (KLONOPIN) 0.5 MG tablet Take 1 tablet by mouth as needed.    . diphenhydrAMINE (BENADRYL) 25 mg capsule Take 25 mg by mouth every 6 (six) hours as needed.     Mariane Baumgarten Sodium 100 MG capsule Take 100 mg by mouth 2 (two) times daily as needed.     Marland Kitchen  escitalopram (LEXAPRO) 20 MG tablet Take 20 mg by mouth daily.    . fluticasone (FLONASE) 50 MCG/ACT nasal spray 2 sprays by Each Nare route daily.    . ondansetron (ZOFRAN ODT) 8 MG disintegrating tablet Take 1 tablet (8 mg total) by mouth every 8 (eight) hours as needed. (Patient not taking: Reported on 01/19/2019) 8 tablet 0  . prednisoLONE acetate (PRED FORTE) 1 % ophthalmic suspension Place 1 drop into both eyes every 4 (four) hours.    . sodium fluoride (DENTAGEL) 1.1 % GEL dental gel DentaGel 1.1 %  AFTER BRUSHING AND FLOSSING BRUSH WITH GEL FOR 3 MINUTES ONCE A DAY, DO NOT EAT,DRINK FOR 30 MINUTES     No current facility-administered medications for this visit.     Review of Systems  Constitutional: Positive for malaise/fatigue and weight loss (fluctuates). Negative for chills, diaphoresis and fever.  HENT: Negative for congestion, hearing loss, nosebleeds and sinus pain.   Eyes: Positive for blurred vision (vision issues x7 years).  Respiratory: Positive for cough (when lying down) and shortness of breath (severe, exertional). Negative for sputum production.   Cardiovascular: Negative for chest pain, palpitations, orthopnea, leg swelling and PND.  Gastrointestinal: Positive for abdominal pain (LUQ) and nausea (takes ondansetron). Negative for blood in stool, constipation, diarrhea, heartburn and vomiting.       Irritable bowel syndrome.  Genitourinary: Positive for urgency (pressure with urination). Negative for dysuria and frequency.       Occasional bladder spasm.  Musculoskeletal: Positive for back pain (s/p cortisone injections), joint pain (knees, hips) and myalgias (left lower leg cramps).       Fibromylagia.  Skin: Negative for rash.  Neurological: Positive for headaches ("every day of my life"). Negative for dizziness, tingling, sensory change and weakness.  Endo/Heme/Allergies: Does not bruise/bleed easily.  Psychiatric/Behavioral: Negative for depression and memory loss.  The patient has insomnia (difficulty sleeping). The patient is not nervous/anxious.   All other systems reviewed and are negative.   Performance status (ECOG): 2  Physical Exam  Constitutional: She is oriented  to person, place, and time. She appears well-developed and well-nourished. No distress.  HENT:  Long dark hair.  Wearing a mask.   Eyes: Conjunctivae and EOM are normal. No scleral icterus.  Glasses.  Brown eyes.  Abdominal: There is no hepatosplenomegaly.  Neurological: She is alert and oriented to person, place, and time.  Psychiatric: She has a normal mood and affect. Her behavior is normal. Judgment and thought content normal.  Nursing note reviewed.   No visits with results within 3 Day(s) from this visit.  Latest known visit with results is:  Office Visit on 01/06/2019  Component Date Value Ref Range Status  . Anticardiolipin IgG 01/06/2019 11  0 - 14 GPL U/mL Final   Comment: (NOTE)                          Negative:              <15                          Indeterminate:     15 - 20                          Low-Med Positive: >20 - 80                          High Positive:         >80   . Anticardiolipin IgM 01/06/2019 <9  0 - 12 MPL U/mL Final   Comment: (NOTE)                          Negative:              <13                          Indeterminate:     13 - 20                          Low-Med Positive: >20 - 80                          High Positive:         >80   . Anticardiolipin IgA 01/06/2019 <9  0 - 11 APL U/mL Final   Comment: (NOTE)                          Negative:              <12                          Indeterminate:     12 - 20                          Low-Med Positive: >20 - 80                          High Positive:         >80 Performed At: [facility] [place], [place] [id number] Rush Farmer MD UV:[id number]   . Sodium 01/06/2019 136  135 - 145 mmol/L Final  . Potassium 01/06/2019 3.6  3.5 - 5.1 mmol/L Final   . Chloride 01/06/2019 111  98 - 111 mmol/L Final  . CO2 01/06/2019 17* 22 - 32 mmol/L Final  . Glucose, Bld 01/06/2019 97  70 - 99 mg/dL Final  . BUN 01/06/2019 9  6 - 20 mg/dL Final  . Creatinine, Ser 01/06/2019 0.99  0.44 - 1.00 mg/dL Final  . Calcium 01/06/2019 8.5* 8.9 - 10.3 mg/dL Final  . Total Protein 01/06/2019 7.2  6.5 - 8.1 g/dL Final  . Albumin 01/06/2019 3.8  3.5 - 5.0 g/dL Final  . AST 01/06/2019 16  15 - 41 U/L Final  . ALT 01/06/2019 20  0 - 44 U/L Final  . Alkaline Phosphatase 01/06/2019 54  38 - 126 U/L Final  . Total Bilirubin 01/06/2019 0.6  0.3 - 1.2 mg/dL Final  . GFR calc non Af Amer 01/06/2019 >60  >60 mL/min Final  . GFR calc Af Amer 01/06/2019 >60  >60 mL/min Final  . Anion gap 01/06/2019 8  5 - 15 Final   Performed at [facility], [address], [place], [place] [address]  . Prothrombin Time 01/06/2019 35.8* 11.4 - 15.2 seconds Final  . INR 01/06/2019 3.7* 0.8 - 1.2 Final   Comment: (NOTE) INR goal varies based on device and disease states. Performed at [facility], [address] [place], [place] [address]   . Recommendations-PTGENE: 01/06/2019 Comment   Final   Comment: (NOTE) NEGATIVE No mutation identified. Comment: A point mutation (G20210A) in the factor II (prothrombin) gene is the second most common cause of inherited thrombophilia. The incidence of this mutation in the [place] Caucasian population is about 2% and in the [place] American population it is approximately 0.5%. This mutation is rare in the [place] and Native American population. Being heterozygous for a prothrombin mutation increases the risk for developing venous thrombosis about 2 to 3 times above the general population risk. Being homozygous for the prothrombin gene mutation increases the relative risk for venous thrombosis further, although it is not yet known how much further the risk is increased. In women heterozygous for the prothrombin gene mutation,  the use of estrogen containing oral contraceptives increases the relative risk of venous thrombosis about 16 times and the risk of developing cerebral thrombosis is also significantly increased. In pregnancy the pr                          othrombin gene mutation increases risk for venous thrombosis and may increase risk for stillbirth, placental abruption, pre-eclampsia and fetal growth restriction. If the patient possesses two or more congenital or acquired thrombophilic risk factors, the risk for thrombosis may rise to more than the sum of the risk ratios for the individual mutations. This assay detects only the prothrombin G20210A mutation and does not measure genetic abnormalities elsewhere in the genome. Other thrombotic risk factors may be pursued through systematic clinical laboratory analysis. These factors include the R506Q (Leiden) mutation in the Factor V gene, plasma homocysteine levels, as well as testing for deficiencies of antithrombin III, protein C and protein S. Genetic Counselors are available for health care providers to discuss results at 1-800-345-GENE [phone]). Methodology: DNA analysis of the Factor II gene was performed by PCR amplification followed by restriction analysis. [facility]  agnostic sensitivity is >99% for both. All the tests must be combined with clinical information for the most accurate interpretation. Molecular-based testing is highly accurate, but as in any laboratory test, diagnostic errors may occur. This test was developed and its performance characteristics determined by [facility]. It has not been cleared or approved by the Food and Drug Administration. Poort SR, et al. Blood. 1996; 73:5329-9242. Varga EA. Circulation. 2004; 683:M19-Q22. Mervin Hack, et [place]; 19:700-703. Allison Quarry, PhD, [facility] Ruben Reason, PhD, [facility] Annetta Maw, M.S., PhD, [facility] Alfredo Bach, PhD,  [facility] Norva Riffle, PhD, [facility] Earlean Polka, PhD, [facility] Performed At: [facility] [address] [place], [place] [id number] Nechama Guard MD HE:[id number]   . Recommendations-F5LEID: 01/06/2019 Comment   Final   Comment: (NOTE) Result:  Negative (no mutation found) Factor V Leiden is a specific mutation (R506Q) in the factor V gene that is associated with an increased risk of venous thrombosis. Factor V Leiden is more resistant to inactivation by activated protein C.  As a result, factor V persists in the circulation leading to a mild hyper- coagulable state.  The Leiden mutation accounts for 90% - 95% of APC resistance.  Factor V Leiden has been reported in patients with deep vein thrombosis, pulmonary embolus, central retinal vein occlusion, cerebral sinus thrombosis and hepatic vein thrombosis. Other risk factors to be considered in the workup for venous thrombosis include the G20210A mutation in the factor II (prothrombin) gene, protein S and C deficiency, and antithrombin deficiencies. Anticardiolipin antibody and lupus anticoagulant analysis may be appropriate for certain patients, as well as homocysteine levels. Contact your local [facility] for information on how to order additi                          onal testing if desired. **Genetic counselors are available for health care providers to**  discuss results at 1-800-345-GENE [phone]). Methodology: DNA analysis of the Factor V gene was performed by allele-specific PCR. The diagnostic sensitivity and specificity is >99% for both. Molecular-based testing is highly accurate, but as in any laboratory test, diagnostic errors may occur. All test results must be combined with clinical information for the most accurate interpretation. This test was developed and its performance characteristics determined by [facility]. It has not been cleared or approved by the Food and Drug Administration. References: Voelkerding K  (1996).  Clin Lab Med [phone]. Allison Quarry, PhD, [facility] Ruben Reason, PhD, [facility] Annetta Maw, M.S., PhD, [facility] Alfredo Bach, PhD, [facility] Norva Riffle, PhD, [facility] Earlean Polka PhD, [facility] Performed At: [facility] [place], [place] [id number] Nechama Guard MD XA:[id number]                          2   . WBC 01/06/2019 8.8  4.0 - 10.5 K/uL Final  . RBC 01/06/2019 4.51  3.87 - 5.11 MIL/uL Final  . Hemoglobin 01/06/2019 13.0  12.0 - 15.0 g/dL Final  . HCT 01/06/2019 38.4  36.0 - 46.0 % Final  . MCV 01/06/2019 85.1  80.0 - 100.0 fL Final  . MCH 01/06/2019 28.8  26.0 - 34.0 pg Final  . MCHC 01/06/2019 33.9  30.0 - 36.0 g/dL Final  . RDW 01/06/2019 14.0  11.5 - 15.5 % Final  . Platelets 01/06/2019 368  150 - 400 K/uL Final  . nRBC 01/06/2019 0.0  0.0 -  0.2 % Final  . Neutrophils Relative % 01/06/2019 65  % Final  . Neutro Abs 01/06/2019 5.8  1.7 - 7.7 K/uL Final  . Lymphocytes Relative 01/06/2019 25  % Final  . Lymphs Abs 01/06/2019 2.2  0.7 - 4.0 K/uL Final  . Monocytes Relative 01/06/2019 6  % Final  . Monocytes Absolute 01/06/2019 0.5  0.1 - 1.0 K/uL Final  . Eosinophils Relative 01/06/2019 3  % Final  . Eosinophils Absolute 01/06/2019 0.2  0.0 - 0.5 K/uL Final  . Basophils Relative 01/06/2019 1  % Final  . Basophils Absolute 01/06/2019 0.1  0.0 - 0.1 K/uL Final  . Immature Granulocytes 01/06/2019 0  % Final  . Abs Immature Granulocytes 01/06/2019 0.03  0.00 - 0.07 K/uL Final   Performed at [facility], [address], [place], [place] [address]  . Beta-2 Glyco I IgG 01/06/2019 <9  0 - 20 GPI IgG units Final   Comment: (NOTE) The reference interval reflects a 3SD or 99th percentile interval, which is thought to represent a potentially clinically significant result in accordance with the International Consensus Statement on the classification criteria for definitive antiphospholipid syndrome (APS). J Thromb Haem  2006;4:295-306.   . Beta-2-Glycoprotein I IgM 01/06/2019 <9  0 - 32 GPI IgM units Final   Comment: (NOTE) The reference interval reflects a 3SD or 99th percentile interval, which is thought to represent a potentially clinically significant result in accordance with the International Consensus Statement on the classification criteria for definitive antiphospholipid syndrome (APS). J Thromb Haem 2006;4:295-306. Performed At: [facility] [place], [place] [id number] Rush Farmer MD ZS:[id number]   . Beta-2-Glycoprotein I IgA 01/06/2019 <9  0 - 25 GPI IgA units Final   Comment: (NOTE) The reference interval reflects a 3SD or 99th percentile interval, which is thought to represent a potentially clinically significant result in accordance with the International Consensus Statement on the classification criteria for definitive antiphospholipid syndrome (APS). J Thromb Haem 2006;4:295-306.     Assessment:  Angela [name] is a 43 y.o. female with recurrent pulmonary embolism (06/2005 and 12/2018).  She is on Coumadin.  Work-up on 12/22/2018 included the following normal/negative labs: CBC with diff, lupus anticoagulant, protein C activity, and protein S activity/antigen.  Normal labs on 01/06/2019 included: Factor V Leiden, prothrombin gene mutation, anticardiolipin antibodies, and beta2-glycoprotein antibodies.  Chest CT angiogram on 12/20/2018 revealed a wedge-shaped opacity in the right lower lobe lateral basal segment consistent with a pulmonary infarct. There was relative hypoenhancement within the associated segmental artery probably secondary to a small pulmonary embolus, though it was difficult to assess given limitations of beam attenuation caused by the patient's body habitus.  There was a small right pleural effusion with associated atelectasis.  Bilateral lower extremity duplex on 12/21/2018 revealed no femoropopliteal and no calf DVT in the  visualized calf veins.  Abdomen and pelvis CT on 01/16/2019 revealed no acute findings. There was a new 2 cm pleural-based nodule in posterior right lower lobe.   Symptomatically, she notes persistent left upper abdominal pain.  She notes fatigue when walking to the mailbox.  Plan: 1.   Review interval labs.  2.   Recurrent pulmonary embolism             Patient with pulmonary embolism in 2006 and 2020.             Etiology unclear.             Work-up is negative  to date.  Current Coumadin dose is 5 mg/day except on Tuesdays.                         Goal INR 2-3.                         INR is currently not at steady state.   Follow-up INR scheduled next week.                   Plan is for life long anticoagulation. 3.   LUQ pain              Etiology unclear.             Plain films and ultrasound were negative.  Abdomen and pelvis CT on 01/16/2019 revealed a new 2 cm pleural-based nodule in posterior right lower lobe.    Significance unclear.   Review of chest CT angiogram on 12/20/2018 revealed no lesion (obscured by haziness/fluid in lower lobe).   Consider possible sequelae from pulmonary infarction.   Discuss PET scan recommended by radiology. 4.   Weight loss  Patient interested in weight loss.  Referral to registered dietician.  Patient in agreement. 5.   Schedule PET scan on 01/24/2019. 6.   RTC after PET scan for MD assessment (Doximity), review of imaging, and discussion regarding direction of therapy.  I discussed the assessment and treatment plan with the patient.  The patient was provided an opportunity to ask questions and all were answered.  The patient agreed with the plan and demonstrated an understanding of the instructions.  The patient was advised to call back or seek an in person evaluation if the symptoms worsen or if the condition fails to improve as anticipated.  I provided 23 minutes (10:37 AM - 11:00 AM) of face-to-face video visit time during this this  encounter and > 50% was spent counseling as documented under my assessment and plan.  I provided these services from the [facility] office.   Nolon Stalls, MD, PhD  01/19/2019, 10:37 AM  I, Molly Dorshimer, am acting as Education administrator for [employer]. Mike Gip, MD, PhD.  I, Melissa C. Mike Gip, MD, have reviewed the above documentation for accuracy and completeness, and I agree with the above.

## 2019-01-19 ENCOUNTER — Inpatient Hospital Stay: Payer: Medicare Other | Attending: Hematology and Oncology | Admitting: Hematology and Oncology

## 2019-01-19 DIAGNOSIS — R911 Solitary pulmonary nodule: Secondary | ICD-10-CM | POA: Diagnosis not present

## 2019-01-19 DIAGNOSIS — R222 Localized swelling, mass and lump, trunk: Secondary | ICD-10-CM | POA: Insufficient documentation

## 2019-01-19 DIAGNOSIS — Z7901 Long term (current) use of anticoagulants: Secondary | ICD-10-CM

## 2019-01-19 DIAGNOSIS — I2699 Other pulmonary embolism without acute cor pulmonale: Secondary | ICD-10-CM | POA: Diagnosis not present

## 2019-01-19 DIAGNOSIS — R635 Abnormal weight gain: Secondary | ICD-10-CM

## 2019-01-20 ENCOUNTER — Encounter: Payer: Self-pay | Admitting: Hematology and Oncology

## 2019-01-22 ENCOUNTER — Encounter: Payer: Self-pay | Admitting: Hematology and Oncology

## 2019-01-26 ENCOUNTER — Other Ambulatory Visit: Payer: Self-pay

## 2019-01-26 ENCOUNTER — Encounter
Admission: RE | Admit: 2019-01-26 | Discharge: 2019-01-26 | Disposition: A | Discharge status: Home / Self Care | Payer: Medicare Other | Source: Ambulatory Visit | Attending: Hematology and Oncology | Admitting: Hematology and Oncology

## 2019-01-26 DIAGNOSIS — R911 Solitary pulmonary nodule: Secondary | ICD-10-CM | POA: Diagnosis present

## 2019-01-26 DIAGNOSIS — Z79899 Other long term (current) drug therapy: Secondary | ICD-10-CM | POA: Diagnosis not present

## 2019-01-26 LAB — GLUCOSE, CAPILLARY: Glucose-Capillary: 72 mg/dL (ref 70–99)

## 2019-01-26 MED ORDER — FLUDEOXYGLUCOSE F - 18 (FDG) INJECTION
14.9000 | Freq: Once | INTRAVENOUS | Status: AC | PRN
  Administered 2019-01-26: 14.8 via INTRAVENOUS

## 2019-01-27 ENCOUNTER — Other Ambulatory Visit: Payer: Self-pay

## 2019-01-30 ENCOUNTER — Inpatient Hospital Stay (HOSPITAL_BASED_OUTPATIENT_CLINIC_OR_DEPARTMENT_OTHER): Payer: Medicare Other | Admitting: Hematology and Oncology

## 2019-01-30 ENCOUNTER — Encounter: Payer: Self-pay | Admitting: Hematology and Oncology

## 2019-01-30 DIAGNOSIS — I2699 Other pulmonary embolism without acute cor pulmonale: Secondary | ICD-10-CM

## 2019-01-30 DIAGNOSIS — R635 Abnormal weight gain: Secondary | ICD-10-CM | POA: Insufficient documentation

## 2019-01-30 DIAGNOSIS — R911 Solitary pulmonary nodule: Secondary | ICD-10-CM

## 2019-01-30 DIAGNOSIS — Z7901 Long term (current) use of anticoagulants: Secondary | ICD-10-CM

## 2019-01-30 NOTE — Progress Notes (Signed)
Confirmed Name, DOB, and Address. Denies any concern.

## 2019-01-30 NOTE — Progress Notes (Signed)
[name]  [address], [address] [place], [place] [address] Phone: [phone]  Fax: [phone]   Telemedicine Office Visit:  01/30/2019  Referring physician: Sharyne Peach, MD  I connected with Angela [name] on 01/30/2019 at 10:31 AM by videoconferencing and verified that I was speaking with the correct person using 2 identifiers.  The patient was at home.  I discussed the limitations, risk, security and privacy concerns of performing an evaluation and management service by videoconferencing and the availability of in person appointments.  I also discussed with the patient that there may be a patient responsible charge related to this service.  The patient expressed understanding and agreed to proceed.   Chief Complaint: Angela [name] is a 43 y.o. female with recurrent pulmonary embolismwho is seen for review interval PET scan.   HPI: The patient was last seen in the hematology clinic on 01/19/2019. At that time, she was doing "okay." She reported fatigue and shortness of breath with minimal exertion. She continued to have pressure with urination. She continued to have leg cramps, back pain, and joint pain in her knees and hips.   She contacted the clinic about pain management on 01/20/2019. She wished to see a local pain management physician and was concerned about medication interactions with Coumadin.   PET scan on 01/26/2019 revealed no hypermetabolic findings highly suspicious for a malignant process. 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 There was low level hypermetabolism within a mildly prominent left level 2 neck lymph node, nonspecific, statistically  most likely to be a reactive node. If there were clinical or laboratory findings worrisome for a neoplastic process, ultrasound-guided biopsy could be attempted.  During the interim, the patient is not doing good. She reports walking, standing, and talking a lot causes her to have shortness of breath. She is still having LUQ pain in her abdomen, and she still has to sit a certain way to relieve the pain. She denies any tenderness in her neck, and sore throat. She states always having an tickling, itchy throat, sinus issues, and sounds hoarse all the time. Her symptoms are unchanged.  She is still taking Coumadin 5 mg daily except 7.5 mg on Tuesdays.  She is interested in taking with a GI doctor and a nurtritionist.    Past Medical History:  Diagnosis Date  . Anemia   . Anxiety   . Asthma   . Depression   . Fibromyalgia   . GERD (gastroesophageal reflux disease)   . Hypertension   . Irritable bowel syndrome (IBS)   . Pseudotumor cerebri   . Raynaud's disease   . Thyroid disease    hypothyroid  . Vision disturbance     Past Surgical History:  Procedure Laterality Date  . ABDOMINAL HYSTERECTOMY  02/06/13  . BREAST BIOPSY Right October, 2014   right breast core biopsy, fibroadenomatous changes  . BREAST BIOPSY Right December 2014   FNA retroareolar nodule consistent with fibroadenoma.  Marland Kitchen BREAST BIOPSY Right 12/16/2016   Fibroadenoma in the retroareolar area at 6:00.  Marland Kitchen [place]  . COLONOSCOPY  2015   Dr Allen Norris  . RHINOPLASTY    . TONSILECTOMY, ADENOIDECTOMY, BILATERAL MYRINGOTOMY AND TUBES  2004  . UPPER GASTROINTESTINAL ENDOSCOPY  2015   Dr Allen Norris    Family History  Problem Relation Age of Onset  . Depression Mother   . Migraines Mother   . Diverticulitis Mother   . Hypertension Mother   . Heart disease Father   . Diabetes Father   . Hypertension Father   . Stroke Sister   . Cancer Maternal Aunt   . Cancer Maternal Grandmother     Social  History:  reports that she has never smoked. She has never used smokeless tobacco. She reports current alcohol use. She reports that she does not use drugs. She denies drug use. She drinks alcohol occasionally, in social settings. She has 3 children and 3 grandchildren.She lives in [place].The patient is alone today.  Participants in the patient's visit and their role in the encounter included the patient, and Waymon Budge, RN today.  The intake visit was provided by Waymon Budge, RN.  Allergies:  Allergies  Allergen Reactions  . Amoxicillin Hives  . Lisinopril Cough  . Nortriptyline   . Other     Sulfa eye drops- Scratched corneas  . Penicillins Hives  . Sulfa Antibiotics     Current Medications: Current Outpatient Medications  Medication Sig Dispense Refill  . acetaZOLAMIDE (DIAMOX) 250 MG tablet Take 650 mg by mouth 3 (three) times daily.    Marland Kitchen albuterol (PROVENTIL HFA;VENTOLIN HFA) 108 (90 BASE) MCG/ACT inhaler Inhale 2 puffs into the lungs every 6 (six) hours as needed for wheezing.    . Biotin 1000 MCG tablet Take 1,000 mcg by mouth 4 (four) times a week.    . Buprenorphine HCl (BELBUCA) 150 MCG FILM Place 150 mcg inside cheek daily.    Marland Kitchen buPROPion (WELLBUTRIN SR) 150 MG 12 hr tablet Take 150 mg by mouth daily.    . cyclobenzaprine (FLEXERIL) 10 MG tablet Take 1 tablet by mouth as needed.    . diazepam (VALIUM) 5 MG tablet Take 5 mg by mouth every 8 (eight) hours as needed.     . Difluprednate (DUREZOL) 0.05 % EMUL Durezol 0.05 % eye drops  PLACE 1 DROP INTO RIGHT EYE EVERY DAY FOR 7 DAYS    . diphenhydrAMINE (BENADRYL) 25 mg capsule Take 25 mg by mouth every 6 (six) hours as needed.     Eduard Roux (AIMOVIG, 140 MG DOSE,) 70 MG/ML SOAJ Inject 1 Dose into the skin every 28 (twenty-eight) days.    Marland Kitchen levothyroxine (SYNTHROID) 150 MCG tablet Take 150 mcg by mouth daily before breakfast.     . linaclotide (LINZESS) 145 MCG CAPS capsule Take 1 capsule (145 mcg  total) by mouth daily before breakfast. (Patient taking differently: Take 145 mcg by mouth as needed. ) 90 capsule 3  . Melatonin 5 MG CAPS Take 1 tablet by mouth at bedtime.    Marland Kitchen omeprazole (PRILOSEC) 20 MG capsule Take 20 mg by mouth every other day.     . ondansetron (ZOFRAN) 8 MG tablet Take 8 mg by mouth every 8 (eight) hours as needed for nausea or vomiting.    Marland Kitchen oxybutynin (DITROPAN-XL) 10 MG 24 hr tablet Take 10 mg by mouth at bedtime.     . prednisoLONE acetate (PRED FORTE) 1 % ophthalmic suspension Place 1 drop into both eyes every 4 (four) hours.    . promethazine (PHENERGAN) 25 MG tablet Take 25 mg by mouth every 6 (six) hours as needed for nausea or vomiting.    . rosuvastatin (CRESTOR) 20 MG tablet Take 20 mg by mouth daily.    Marland Kitchen  sodium fluoride (DENTAGEL) 1.1 % GEL dental gel DentaGel 1.1 %  AFTER BRUSHING AND FLOSSING BRUSH WITH GEL FOR 3 MINUTES ONCE A DAY, DO NOT EAT,DRINK FOR 30 MINUTES    . tiZANidine (ZANAFLEX) 4 MG tablet Take 4 mg by mouth every 8 (eight) hours as needed for muscle spasms.    Marland Kitchen topiramate (TOPAMAX) 100 MG tablet Take 100 mg by mouth 2 (two) times daily.     Marland Kitchen warfarin (COUMADIN) 5 MG tablet Take 5 mg every other day alternating with 7.5 mg. (Patient taking differently: Take 5 mg by mouth. Take 5 mg M,W, TH, F, Sa, Su) 30 tablet 0  . warfarin (COUMADIN) 7.5 MG tablet Take 1 tablet (7.5 mg total) by mouth daily. (Patient taking differently: Take 7.5 mg by mouth daily. On Tuesdays) 30 tablet 11  . butalbital-acetaminophen-caffeine (FIORICET) 50-325-40 MG tablet Take 1 tablet by mouth as needed.    Marland Kitchen EPINEPHrine (EPIPEN 2-PAK) 0.3 mg/0.3 mL IJ SOAJ injection EpiPen 2-Pak 0.3 mg/0.3 mL injection, auto-injector  INJECT 0.3 MLS (0.3 MG TOTAL) INTO THE MUSCLE ONCE AS NEEDED FOR ANAPHYLAXIS FOR UP TO 1 DOSE.     No current facility-administered medications for this visit.     Review of Systems  Constitutional: Positive for malaise/fatigue and weight loss  (fluctuates). Negative for chills, diaphoresis and fever.       Not doing too good.  HENT: Negative.  Negative for congestion, hearing loss, sinus pain and sore throat.   Eyes: Positive for blurred vision (vision issues x7 years).  Respiratory: Positive for shortness of breath (severe, exertional). Negative for cough and wheezing.   Cardiovascular: Negative.  Negative for chest pain, palpitations, orthopnea, leg swelling and PND.  Gastrointestinal: Positive for abdominal pain (LUQ) and nausea (on ondansetron). Negative for blood in stool, constipation, diarrhea, melena and vomiting.       Irritable bowel syndrome.   Genitourinary: Negative for urgency.       Occasional bladder spasm.   Musculoskeletal: Positive for back pain (s/p cortisone injections), joint pain (knees, hips) and myalgias (lower leg cramps).       Fibromylagia.   Skin: Negative.  Negative for rash.  Neurological: Positive for headaches (daily). Negative for dizziness, tingling, speech change and weakness.  Endo/Heme/Allergies: Negative.  Does not bruise/bleed easily.  Psychiatric/Behavioral: Negative for depression and memory loss. The patient has insomnia (difficulty sleeping). The patient is not nervous/anxious.   All other systems reviewed and are negative.   Performance status (ECOG): 2-3  Physical Exam  Constitutional: She is oriented to person, place, and time. She appears well-developed and well-nourished. No distress.  HENT:  Head: Normocephalic and atraumatic.  Long dark hair.  Eyes: Conjunctivae and EOM are normal. No scleral icterus.  Glasses.  Brown eyes.  Neurological: She is alert and oriented to person, place, and time.  Skin: She is not diaphoretic.  Psychiatric: She has a normal mood and affect. Her behavior is normal. Judgment and thought content normal.  Nursing note reviewed.     No visits with results within 3 Day(s) from this visit.  Latest known visit with results is:  Hospital Outpatient  Visit on 01/26/2019  Component Date Value Ref Range Status  . Glucose-Capillary 01/26/2019 72  70 - 99 mg/dL Final    Assessment:  Angela [name] is a 43 y.o. 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 She is on Coumadin.  She is currently taking 5 mg daily except 7.5 mg on Tuesdays.  Work-up on 12/22/2018  included the following normal/negative labs: CBC with diff, lupus anticoagulant, protein C activity, and protein S activity/antigen.Normal labs on 01/06/2019 included: Factor V Leiden, prothrombin gene mutation, anticardiolipin antibodies, and beta2-glycoprotein antibodies.  Chest CT angiogramon 12/20/2018 revealed a wedge-shaped opacity in the right lower lobelateral basal segment consistent with a pulmonary infarct. There was relative hypoenhancement within the associated segmental artery probablysecondary toa small pulmonary embolus, though it wasdifficult to assessgiven limitationsof beam attenuation caused by the patient's body habitus. There was a small right pleural effusion with associated atelectasis.  Bilateral lower extremity duplexon 12/21/2018 revealed no femoropopliteal and no calf DVT in the visualized calf veins.  Abdomen and pelvis CT on 01/16/2019 revealed no acute findings. There was a new 2 cm pleural-based nodule in posterior right lower lobe.   PET scan on 01/26/2019 revealed no hypermetabolic findings highly suspicious for a malignant process. 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  She continues to have LUQ abdominal pain.  Plan: 1.   Review interval labs.  2. Recurrent pulmonary embolism Patient with pulmonary embolism in  2006 and 2020. Work-up is negative to date.   Review interval PET scan.  No evidence of malignancy. She is currently taking Coumadin 5 mg Wednesday - Monday and 7.5 mg on Tuesdays.  Goal INR is 2-3.  Discuss follow-up INRs with Dr. Iona Beard.  Discus plan for lifelong anticoagulation 3. LUQ pain Etiology remains unclear.  Plain films and ultrasound were negative.             Abdomen and pelvis CT scan on 01/16/2019 revealed revealed 2 pleural-based nodules in the posterior RLL.    Etiology felt sequela from pulmonary infarction.  PET scan reveals no evidence of malignancy.  Discuss follow-up with GI.  Patient is agreeable. 4.   Weight loss  Patient would like to have follow-up with registered dietician. 5.   Patient to have INRs with Dr. Iona Beard. 6.   Please schedule f/u appt with Dr Allen Norris (patient's gastroenterologist) for persistent LUQ pain. 7.   Please f/u with registered dietician from cancer center- she has not gotten a call yet. 8.   RTC in 6 months for MD assess, labs (CBC with diff, CMP, PT/INR).  I discussed the assessment and treatment plan with the patient.  The patient was provided an opportunity to ask questions and all were answered.  The patient agreed with the plan and demonstrated an understanding of the instructions.  The patient was advised to call back or seek an in person evaluation if the symptoms worsen or if the condition fails to improve as anticipated.  I provided 16 minutes (10:31 AM - 10:47 AM) of face-to-face video visit time during this this encounter and > 50% was spent counseling as documented under my assessment and plan.  I provided these services from the [facility] office.   Nolon Stalls, MD, PhD  01/30/2019, 10:31 AM  I, Selena Batten, am acting as scribe for [employer]. Mike Gip, MD, PhD.  I, Melissa C. Mike Gip, MD, have reviewed the above documentation for accuracy and completeness, and I agree  with the above.

## 2019-01-31 ENCOUNTER — Other Ambulatory Visit: Payer: Self-pay | Admitting: Hematology and Oncology

## 2019-01-31 NOTE — Telephone Encounter (Signed)
Protime-INR Order: [id number] Status:  Final result Visible to patient:  Yes (MyChart) Next appt:  None Dx:  Pulmonary embolism with infarction (H...  Ref Range & Units 3wk ago (01/06/19) 77mo ago (12/23/18) 66mo ago (12/22/18) 61mo ago (12/21/18)  Prothrombin Time 11.4 - 15.2 seconds 35.8High   15.8High   14.5  14.2   INR 0.8 - 1.2 3.7High   1.3High  CM  1.1 CM  1.1 CM   Comment: (NOTE)  INR goal varies based on device and disease states.  Performed at [facility], [address] [place],  [place] [address]   Resulting Agency  [facility] CLIN LAB [facility] LAB [facility] LAB [facility]      Specimen Collected: 01/06/19 12:04 Last Resulted: 01/06/19 20:10

## 2019-02-06 ENCOUNTER — Emergency Department: Payer: Medicare Other

## 2019-02-06 ENCOUNTER — Other Ambulatory Visit: Payer: Self-pay

## 2019-02-06 ENCOUNTER — Emergency Department
Admission: EM | Admit: 2019-02-06 | Discharge: 2019-02-06 | Disposition: A | Discharge status: Home / Self Care | Payer: Medicare Other | Attending: Emergency Medicine | Admitting: Emergency Medicine

## 2019-02-06 DIAGNOSIS — J45909 Unspecified asthma, uncomplicated: Secondary | ICD-10-CM | POA: Diagnosis not present

## 2019-02-06 DIAGNOSIS — R0602 Shortness of breath: Secondary | ICD-10-CM | POA: Insufficient documentation

## 2019-02-06 DIAGNOSIS — R350 Frequency of micturition: Secondary | ICD-10-CM | POA: Diagnosis not present

## 2019-02-06 DIAGNOSIS — E039 Hypothyroidism, unspecified: Secondary | ICD-10-CM | POA: Diagnosis not present

## 2019-02-06 DIAGNOSIS — R1030 Lower abdominal pain, unspecified: Secondary | ICD-10-CM | POA: Diagnosis not present

## 2019-02-06 DIAGNOSIS — R2243 Localized swelling, mass and lump, lower limb, bilateral: Secondary | ICD-10-CM | POA: Diagnosis present

## 2019-02-06 DIAGNOSIS — Z7901 Long term (current) use of anticoagulants: Secondary | ICD-10-CM | POA: Diagnosis not present

## 2019-02-06 DIAGNOSIS — M79661 Pain in right lower leg: Secondary | ICD-10-CM | POA: Insufficient documentation

## 2019-02-06 DIAGNOSIS — I10 Essential (primary) hypertension: Secondary | ICD-10-CM | POA: Diagnosis not present

## 2019-02-06 DIAGNOSIS — M7989 Other specified soft tissue disorders: Secondary | ICD-10-CM

## 2019-02-06 DIAGNOSIS — M79662 Pain in left lower leg: Secondary | ICD-10-CM | POA: Insufficient documentation

## 2019-02-06 DIAGNOSIS — Z79899 Other long term (current) drug therapy: Secondary | ICD-10-CM | POA: Diagnosis not present

## 2019-02-06 HISTORY — DX: Other pulmonary embolism without acute cor pulmonale: I26.99

## 2019-02-06 LAB — CBC WITH DIFFERENTIAL/PLATELET
Abs Immature Granulocytes: 0.02 10*3/uL (ref 0.00–0.07)
Basophils Absolute: 0.1 10*3/uL (ref 0.0–0.1)
Basophils Relative: 1 %
Eosinophils Absolute: 0.2 10*3/uL (ref 0.0–0.5)
Eosinophils Relative: 2 %
HCT: 37.9 % (ref 36.0–46.0)
Hemoglobin: 12.4 g/dL (ref 12.0–15.0)
Immature Granulocytes: 0 %
Lymphocytes Relative: 30 %
Lymphs Abs: 2.3 10*3/uL (ref 0.7–4.0)
MCH: 28.2 pg (ref 26.0–34.0)
MCHC: 32.7 g/dL (ref 30.0–36.0)
MCV: 86.1 fL (ref 80.0–100.0)
Monocytes Absolute: 0.6 10*3/uL (ref 0.1–1.0)
Monocytes Relative: 7 %
Neutro Abs: 4.5 10*3/uL (ref 1.7–7.7)
Neutrophils Relative %: 60 %
Platelets: 335 10*3/uL (ref 150–400)
RBC: 4.4 MIL/uL (ref 3.87–5.11)
RDW: 13.8 % (ref 11.5–15.5)
WBC: 7.6 10*3/uL (ref 4.0–10.5)
nRBC: 0 % (ref 0.0–0.2)

## 2019-02-06 LAB — COMPREHENSIVE METABOLIC PANEL
ALT: 14 U/L (ref 0–44)
AST: 15 U/L (ref 15–41)
Albumin: 3.5 g/dL (ref 3.5–5.0)
Alkaline Phosphatase: 49 U/L (ref 38–126)
Anion gap: 4 — ABNORMAL LOW (ref 5–15)
BUN: 9 mg/dL (ref 6–20)
CO2: 18 mmol/L — ABNORMAL LOW (ref 22–32)
Calcium: 8.3 mg/dL — ABNORMAL LOW (ref 8.9–10.3)
Chloride: 116 mmol/L — ABNORMAL HIGH (ref 98–111)
Creatinine, Ser: 0.87 mg/dL (ref 0.44–1.00)
GFR calc Af Amer: >60 mL/min (ref >60)
GFR calc non Af Amer: >60 mL/min (ref >60)
Glucose, Bld: 105 mg/dL — ABNORMAL HIGH (ref 70–99)
Potassium: 3.3 mmol/L — ABNORMAL LOW (ref 3.5–5.1)
Sodium: 138 mmol/L (ref 135–145)
Total Bilirubin: 0.5 mg/dL (ref 0.3–1.2)
Total Protein: 6.8 g/dL (ref 6.5–8.1)

## 2019-02-06 LAB — TROPONIN I (HIGH SENSITIVITY)
Troponin I (High Sensitivity): <2 ng/L (ref <18)
Troponin I (High Sensitivity): <2 ng/L (ref <18)

## 2019-02-06 LAB — URINALYSIS, COMPLETE (UACMP) WITH MICROSCOPIC
Bilirubin Urine: NEGATIVE
Glucose, UA: NEGATIVE mg/dL
Hgb urine dipstick: NEGATIVE
Ketones, ur: NEGATIVE mg/dL
Leukocytes,Ua: NEGATIVE
Nitrite: NEGATIVE
Protein, ur: NEGATIVE mg/dL
Specific Gravity, Urine: 1.013 (ref 1.005–1.030)
pH: 6 (ref 5.0–8.0)

## 2019-02-06 LAB — APTT: aPTT: 66 seconds — ABNORMAL HIGH (ref 24–36)

## 2019-02-06 LAB — PROTIME-INR
INR: 2 — ABNORMAL HIGH (ref 0.8–1.2)
Prothrombin Time: 22 seconds — ABNORMAL HIGH (ref 11.4–15.2)

## 2019-02-06 LAB — BRAIN NATRIURETIC PEPTIDE: B Natriuretic Peptide: 33 pg/mL (ref 0.0–100.0)

## 2019-02-06 NOTE — ED Triage Notes (Signed)
Pt reports that PCP said for pt to come here - pt has edema in bilat ext - pt reports that from Friday to Sunday he only voided 3 times - c/o pressure with urination but did not go to pt PCP - c/o [facility] since PE 5/12

## 2019-02-06 NOTE — ED Notes (Signed)
Ultrasound in room

## 2019-02-06 NOTE — ED Provider Notes (Signed)
[facility] Emergency Department Provider Note ____________________________________________   First MD Initiated Contact with Patient 02/06/19 1507     (approximate)  I have reviewed the triage vital signs and the nursing notes.   HISTORY  Chief Complaint Leg Swelling and Shortness of Breath    HPI Angela [name] is a 43 y.o. female with PMH as noted below who presents with multiple complaints.  The patient has had increased bilateral lower extremity swelling and pain over the last week.  It subsided over the last few days but she states that she still has pain and some swelling especially on the left.  In addition, the patient reports decreased urinary frequency, and some lower abdominal pressure and thought she may have a UTI.  The patient also reports shortness of breath but states that this has been persistent since she was diagnosed with a PE last month.  She is on Coumadin and is compliant.  She is also on Diamox due to intracranial hypertension.  Past Medical History:  Diagnosis Date  . Anemia   . Anxiety   . Asthma   . Depression   . Fibromyalgia   . GERD (gastroesophageal reflux disease)   . Hypertension   . Irritable bowel syndrome (IBS)   . Pseudotumor cerebri   . Pulmonary embolism ([place])   . Raynaud's disease   . Thyroid disease    hypothyroid  . Vision disturbance     Patient Active Problem List   Diagnosis Date Noted  . Right lower lobe pulmonary nodule 01/30/2019  . Weight gain 01/30/2019  . Pleural nodule 01/19/2019  . Long term current use of anticoagulant therapy 01/06/2019  . Pulmonary embolism with infarction ([place]) 12/20/2018  . Nipple discharge in female 11/27/2016  . Other shoulder lesions, unspecified shoulder 07/22/2015  . Clinical depression 06/18/2015  . Headache, migraine 06/18/2015  . 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 Mass of  breast, right 05/24/2014  . Lump or mass in breast 05/29/2013  . Breast lump 05/29/2013  . Essential (primary) hypertension 04/26/2013  . Cephalalgia 04/26/2013  . Fibroid 04/26/2013  . Muscle ache 04/26/2013  . Benign intracranial hypertension 12/08/2012  . Increased prolactin level ([place]) 12/08/2012  . Anxiety state 06/12/2011    Past Surgical History:  Procedure Laterality Date  . ABDOMINAL HYSTERECTOMY  02/06/13  . BREAST BIOPSY Right October, 2014   right breast core biopsy, fibroadenomatous changes  . BREAST BIOPSY Right December 2014   FNA retroareolar nodule consistent with fibroadenoma.  Marland Kitchen BREAST BIOPSY Right 12/16/2016   Fibroadenoma in the retroareolar area at 6:00.  Marland Kitchen [place]  . COLONOSCOPY  2015   Dr Allen Norris  . RHINOPLASTY    . TONSILECTOMY, ADENOIDECTOMY, BILATERAL MYRINGOTOMY AND TUBES  2004  . UPPER GASTROINTESTINAL ENDOSCOPY  2015   Dr Allen Norris    Prior to Admission medications   Medication Sig Start Date End Date Taking? Authorizing Provider  acetaZOLAMIDE (DIAMOX) 250 MG tablet Take 650 mg by mouth 3 (three) times daily.    [provider]  albuterol (PROVENTIL HFA;VENTOLIN HFA) 108 (90 BASE) MCG/ACT inhaler Inhale 2 puffs into the lungs every 6 (six) hours as needed for wheezing.    [provider]  Biotin 1000 MCG tablet Take 1,000 mcg by mouth 4 (four) times a week.    [provider]  Buprenorphine HCl (BELBUCA) 150 MCG FILM Place 150 mcg inside cheek  daily. 10/11/18   [provider]  buPROPion (WELLBUTRIN SR) 150 MG 12 hr tablet Take 150 mg by mouth daily.    [provider]  butalbital-acetaminophen-caffeine (FIORICET) 50-325-40 MG tablet Take 1 tablet by mouth as needed. 11/17/16   [provider]  cyclobenzaprine (FLEXERIL) 10 MG tablet Take 1 tablet by mouth as needed.    [provider]  diazepam (VALIUM) 5 MG tablet Take 5 mg by mouth every 8 (eight) hours as needed.   06/06/18   [provider]  Difluprednate (DUREZOL) 0.05 % EMUL Durezol 0.05 % eye drops  PLACE 1 DROP INTO RIGHT EYE EVERY DAY FOR 7 DAYS    [provider]  diphenhydrAMINE (BENADRYL) 25 mg capsule Take 25 mg by mouth every 6 (six) hours as needed.     [provider]  EPINEPHrine (EPIPEN 2-PAK) 0.3 mg/0.3 mL IJ SOAJ injection EpiPen 2-Pak 0.3 mg/0.3 mL injection, auto-injector  INJECT 0.3 MLS (0.3 MG TOTAL) INTO THE MUSCLE ONCE AS NEEDED FOR ANAPHYLAXIS FOR UP TO 1 DOSE.    [provider]  Erenumab-aooe (AIMOVIG, 140 MG DOSE,) 70 MG/ML SOAJ Inject 1 Dose into the skin every 28 (twenty-eight) days. 01/25/18   [provider]  levothyroxine (SYNTHROID) 150 MCG tablet Take 150 mcg by mouth daily before breakfast.     [provider]  linaclotide (LINZESS) 145 MCG CAPS capsule Take 1 capsule (145 mcg total) by mouth daily before breakfast. Patient taking differently: Take 145 mcg by mouth as needed.  11/16/16   Lucilla Lame, MD  Melatonin 5 MG CAPS Take 1 tablet by mouth at bedtime.    [provider]  omeprazole (PRILOSEC) 20 MG capsule Take 20 mg by mouth every other day.     [provider]  ondansetron (ZOFRAN) 8 MG tablet Take 8 mg by mouth every 8 (eight) hours as needed for nausea or vomiting.    [provider]  oxybutynin (DITROPAN-XL) 10 MG 24 hr tablet Take 10 mg by mouth at bedtime.  01/25/18   [provider]  prednisoLONE acetate (PRED FORTE) 1 % ophthalmic suspension Place 1 drop into both eyes every 4 (four) hours.    [provider]  promethazine (PHENERGAN) 25 MG tablet Take 25 mg by mouth every 6 (six) hours as needed for nausea or vomiting.    [provider]  rosuvastatin (CRESTOR) 20 MG tablet Take 20 mg by mouth daily.    [provider]  sodium fluoride (DENTAGEL) 1.1 % GEL dental gel DentaGel 1.1 %  AFTER BRUSHING AND FLOSSING BRUSH WITH GEL FOR 3 MINUTES ONCE A  DAY, DO NOT EAT,DRINK FOR 30 MINUTES    [provider]  tiZANidine (ZANAFLEX) 4 MG tablet Take 4 mg by mouth every 8 (eight) hours as needed for muscle spasms.    [provider]  topiramate (TOPAMAX) 100 MG tablet Take 100 mg by mouth 2 (two) times daily.     [provider]  warfarin (COUMADIN) 5 MG tablet Take 5 mg every day except 7.5 mg on Tuesdays. 01/31/19   Lequita Asal, MD  warfarin (COUMADIN) 7.5 MG tablet Take 1 tablet (7.5 mg total) by mouth daily. Patient taking differently: Take 7.5 mg by mouth daily. On Tuesdays 12/23/18 12/23/19  Epifanio Lesches, MD    Allergies Amoxicillin, Lisinopril, Nortriptyline, Other, Penicillins, and Sulfa antibiotics  Family History  Problem Relation Age of Onset  . Depression Mother   . Migraines Mother   .  Diverticulitis Mother   . Hypertension Mother   . Heart disease Father   . Diabetes Father   . Hypertension Father   . Stroke Sister   . Cancer Maternal Aunt   . Cancer Maternal Grandmother     Social History Social History   Tobacco Use  . Smoking status: Never Smoker  . Smokeless tobacco: Never Used  Substance Use Topics  . Alcohol use: Yes    Comment: Occasionally Social Drinker  . Drug use: No    Review of Systems  Constitutional: No fever. Eyes: No redness. ENT: No neck pain. Cardiovascular: Denies chest pain. Respiratory: Positive for shortness of breath. Gastrointestinal: No vomiting or diarrhea.  Genitourinary: Negative for dysuria.  Musculoskeletal: Negative for back pain. Skin: Negative for rash. Neurological: Negative for headache.   ____________________________________________   PHYSICAL EXAM:  VITAL SIGNS: ED Triage Vitals  Enc Vitals Group     BP 02/06/19 1245 (!) 108/59     Pulse Rate 02/06/19 1245 84     Resp 02/06/19 1245 18     Temp 02/06/19 1245 98.9 F (37.2 C)     Temp Source 02/06/19 1245 Oral     SpO2 02/06/19 1245 98 %     Weight 02/06/19 1241  297 lb (134.7 kg)     Height 02/06/19 1241 5\' 3"  (1.6 m)     Head Circumference --      Peak Flow --      Pain Score 02/06/19 1240 4     Pain Loc --      Pain Edu? --      Excl. in [place]? --     Constitutional: Alert and oriented.  Relatively well appearing and in no acute distress. Eyes: Conjunctivae are normal.  Head: Atraumatic. Nose: No congestion/rhinnorhea. Mouth/Throat: Mucous membranes are moist.   Neck: Normal range of motion.  Cardiovascular: Normal rate, regular rhythm. Grossly normal heart sounds.  Good peripheral circulation. Respiratory: Normal respiratory effort.  No retractions. Lungs CTAB. Gastrointestinal: Soft and nontender. No distention.  Genitourinary: No flank tenderness. Musculoskeletal: Trace bilateral lower extremity edema.  Extremities warm and well perfused.  Mild left calf tenderness. Neurologic:  Normal speech and language. No gross focal neurologic deficits are appreciated.  Skin:  Skin is warm and dry. No rash noted. Psychiatric: Mood and affect are normal. Speech and behavior are normal.  ____________________________________________   LABS (all labs ordered are listed, but only abnormal results are displayed)  Labs Reviewed  PROTIME-INR - Abnormal; Notable for the following components:      Result Value   Prothrombin Time 22.0 (*)    INR 2.0 (*)    All other components within normal limits  APTT - Abnormal; Notable for the following components:   aPTT 66 (*)    All other components within normal limits  COMPREHENSIVE METABOLIC PANEL - Abnormal; Notable for the following components:   Potassium 3.3 (*)    Chloride 116 (*)    CO2 18 (*)    Glucose, Bld 105 (*)    Calcium 8.3 (*)    Anion gap 4 (*)    All other components within normal limits  URINALYSIS, COMPLETE (UACMP) WITH MICROSCOPIC - Abnormal; Notable for the following components:   Color, Urine YELLOW (*)    APPearance CLEAR (*)    Bacteria, UA RARE (*)    All other components  within normal limits  BRAIN NATRIURETIC PEPTIDE  TROPONIN I (HIGH SENSITIVITY)  TROPONIN I (HIGH SENSITIVITY)  CBC WITH  DIFFERENTIAL/PLATELET   ____________________________________________  EKG  ED ECG REPORT I, Arta Silence, the attending physician, personally viewed and interpreted this ECG.  Date: 02/06/2019 EKG Time: 1250 Rate: 82 Rhythm: normal sinus rhythm QRS Axis: normal Intervals: normal ST/T Wave abnormalities: Nonspecific T wave flattening laterally Narrative Interpretation: no evidence of acute ischemia; nonspecific slightly flatter lateral T waves when compared to EKG of 12/23/2018  ____________________________________________  RADIOLOGY  CXR: Mild cardiomegaly with no acute abnormality US venous bilateral: No acute DVT  ____________________________________________   PROCEDURES  Procedure(s) performed: No  Procedures  Critical Care performed: No ____________________________________________   INITIAL IMPRESSION / ASSESSMENT AND PLAN / ED COURSE  Pertinent labs & imaging results that were available during my care of the patient were reviewed by me and considered in my medical decision making (see chart for details).  43 year old female with PMH as noted above and status post recent diagnosis of PE for which she is on Coumadin presents with several complaints, specifically bilateral lower extremity swelling which has been present over the last week but improved in the last 1 to 2 days, and decreased urination.  She also reports persistent shortness of breath and poor exercise tolerance which has been present since she was diagnosed with a PE.  I reviewed the patient's past medical records in [place].  She is followed by hematology.  Her hematologic work-up for the PE was negative, and the cause is unclear.  She is on Coumadin and states she is compliant.  On exam the patient is overall relatively comfortable appearing.  Her vital signs are normal.  The  physical exam is unremarkable except for trace bilateral lower extremity edema and very mild left calf tenderness.  1.  Leg swelling: Overall most likely benign peripheral edema given the fact that it has subsided mostly on its own.  The patient had negative Dopplers last month, but since the initial cause of the PE is unclear I think it would be prudent to repeat the ultrasound to verify that no DVT is now present.  Differential also includes CHF.  Chest x-ray shows no evidence of pulmonary edema and the patient's vital signs are normal.  I will recommend outpatient cardiology evaluation.  2.  Urinary symptoms: We will obtain UA to rule out UTI.  Creatinine is normal and there is no evidence of renal etiology.  3.  Shortness of breath: Patient's symptoms are essentially unchanged since she was diagnosed with the PE last month.  Chest x-ray shows no acute findings, troponin is negative, and her vital signs are normal including normal O2 saturation on room air.  INR is therapeutic.  There is no indication for further ED work-up for this.  ----------------------------------------- 5:18 PM on 02/06/2019 -----------------------------------------  Ultrasound and urinalysis are both negative.  The patient is stable for discharge home.  I counseled her on the results of the work-up.  Return precautions given, and she expresses understanding.  ___________________________  Hector Shade was evaluated in Emergency Department on 02/06/2019 for the symptoms described in the history of present illness. She was evaluated in the context of the global COVID-19 pandemic, which necessitated consideration that the patient might be at risk for infection with the SARS-CoV-2 virus that causes COVID-19. Institutional protocols and algorithms that pertain to the evaluation of patients at risk for COVID-19 are in a state of rapid change based on information released by regulatory bodies including the CDC and federal  and state organizations. These policies and algorithms were followed during the patient's  care in the ED. ____________________________________________   FINAL CLINICAL IMPRESSION(S) / ED DIAGNOSES  Final diagnoses:  Leg swelling      NEW MEDICATIONS STARTED DURING THIS VISIT:  New Prescriptions   No medications on file     Note:  This document was prepared using Dragon voice recognition software and may include unintentional dictation errors.    Arta Silence, MD 02/06/19 1718

## 2019-02-06 NOTE — ED Notes (Signed)
Patient transported to X-ray 

## 2019-02-06 NOTE — Discharge Instructions (Addendum)
Continue to take your medications as prescribed.  Make an appointment to follow-up with your primary care doctor.  You likely will need outpatient evaluation by a cardiologist for an echocardiogram at some point within the next several weeks especially if you continue to have shortness of breath with exertion.  Return to the ER for new, worsening, or persistent severe shortness of breath, leg swelling, chest pain, weakness or lightheadedness, or any other new or worsening symptoms that concern you.

## 2019-02-07 ENCOUNTER — Telehealth: Payer: Self-pay

## 2019-02-07 ENCOUNTER — Encounter: Payer: Self-pay | Admitting: Hematology and Oncology

## 2019-02-07 DIAGNOSIS — R1012 Left upper quadrant pain: Secondary | ICD-10-CM

## 2019-02-07 NOTE — Telephone Encounter (Signed)
Faxed new patient referral to Dr Durwin Reges office. i have contacted Dr Durwin Reges office and they have received the new patient referral.The patient was referred to Dr Durwin Reges office for LUQ pain.

## 2019-02-07 NOTE — Telephone Encounter (Signed)
spoke with the patient to inform her that I did contact the office for Dr Durwin Reges and they did receive her New patient referral. I sent a message to Ms Shirlean Mylar to get the patient schedule for Referral to registered dietician here at the Cancer center. The patient was agreeable to keep schedule appointment. I will contact Dr Iona Beard office the patient is concern about the ICV filter and when it need to be placed. I contacted dr Iona Beard office and I was instructed to have the patient contact the office, The patient was notified to contact Dr Iona Beard office. The patient was understanding and agreeable to contact PCP office today.

## 2019-02-16 ENCOUNTER — Inpatient Hospital Stay: Payer: Medicare Other | Attending: Hematology and Oncology

## 2019-02-16 NOTE — Progress Notes (Signed)
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  Past medical history of factor v leiden, PE, steroid induced cushing per pt, IBS, HTN, GERD, depression.  Spoke with patient via phone.  Patient reports that she has tried everything to loose weight.  Reports that she has counted calories, used high protein diets, controlled portion sizes, etc.  Reports that she lost 60 lb using HCG and phentermine-topiramate under the direction of MD and then started steriods and weight came back.  Patient reports that she is frustrated and concerned about her weight.  Considering gastric sleeve for weight loss.    Nutrition Focused Physical Exam: deferred   Medications: reviewed   Labs: reviewed   Anthropometrics:   Height: 63 inches Weight: 297 lb BMI: 52   INTERVENTION:  Recommend referral to [facility] at [facility] for weight loss education and considering bariatric surgery.  Message sent to Dr. Mike Gip and discussed with patient.     Next Visit: no further follow-up at this time  Damean Poffenberger B. Zenia Resides, [place], [place] Registered Dietitian [phone] (pager)

## 2019-02-21 ENCOUNTER — Ambulatory Visit: Payer: Medicare Other | Admitting: Gastroenterology

## 2019-02-24 ENCOUNTER — Other Ambulatory Visit: Payer: Self-pay | Admitting: Hematology and Oncology

## 2019-02-24 NOTE — Telephone Encounter (Signed)
Protime-INR Order: [id number] Status:  Final result Visible to patient:  Yes (MyChart) Next appt:  04/04/2019 at 02:45 PM in Gastroenterology Lucilla Lame, MD)  Ref Range & Units 2wk ago (02/06/19) 25mo ago (01/06/19) 50mo ago (12/23/18) 51mo ago (12/22/18)  Prothrombin Time 11.4 - 15.2 seconds 22.0High   35.8High   15.8High   14.5   INR 0.8 - 1.2 2.0High   3.7High  CM  1.3High  CM  1.1 CM   Comment: (NOTE)  INR goal varies based on device and disease states.  Performed at [facility], [address] [place],  [place] [address]   Resulting Agency  [facility] CLIN LAB [facility] CL[facility] LAB [facility] LAB [facility]      Specimen Collected: 02/06/19 12:48 Last Resulted: 02/06/19 13:08

## 2019-02-27 ENCOUNTER — Other Ambulatory Visit: Payer: Self-pay

## 2019-02-27 DIAGNOSIS — Z20822 Contact with and (suspected) exposure to covid-19: Secondary | ICD-10-CM

## 2019-03-02 LAB — NOVEL CORONAVIRUS, NAA: SARS-CoV-2, NAA: NOT DETECTED

## 2019-03-06 ENCOUNTER — Telehealth: Payer: Self-pay

## 2019-03-06 NOTE — Telephone Encounter (Signed)
Contacted Dr. Cleon Dew office (PCP) to inquire if patient has had her PT/INR levels checked as orignally planned. Lucretia Kern, receptionist for Dr. Cleon Dew office states she will send a note to Dr. Iona Beard to inquire on this. Requested callback with update.

## 2019-03-09 NOTE — Telephone Encounter (Signed)
Inbound call received from Dr. Cleon Dew office. Nurse states they have been monitoring patient's PT/INR but patient has not been showing up for her appts. Nurse reports they were able to get an INR yesterday, 03/08/19, and it resulted at 1.5.

## 2019-03-10 ENCOUNTER — Other Ambulatory Visit: Payer: Self-pay | Admitting: Family Medicine

## 2019-03-10 DIAGNOSIS — I517 Cardiomegaly: Secondary | ICD-10-CM

## 2019-03-12 DIAGNOSIS — I517 Cardiomegaly: Secondary | ICD-10-CM | POA: Insufficient documentation

## 2019-03-31 ENCOUNTER — Other Ambulatory Visit: Payer: Self-pay

## 2019-03-31 ENCOUNTER — Telehealth: Payer: Self-pay | Admitting: Oncology

## 2019-03-31 ENCOUNTER — Encounter: Payer: Self-pay | Admitting: Hematology and Oncology

## 2019-03-31 ENCOUNTER — Other Ambulatory Visit: Payer: Self-pay | Admitting: Oncology

## 2019-03-31 DIAGNOSIS — Z7901 Long term (current) use of anticoagulants: Secondary | ICD-10-CM

## 2019-03-31 DIAGNOSIS — I2699 Other pulmonary embolism without acute cor pulmonale: Secondary | ICD-10-CM

## 2019-03-31 DIAGNOSIS — R319 Hematuria, unspecified: Secondary | ICD-10-CM

## 2019-03-31 NOTE — Telephone Encounter (Signed)
Spoke to patient today regarding complaints of hematuria.  She states hematuria has been present for over 1 month.  She typically notices it first thing in the morning when her bladder is full.  She denies any pain but admits to pressure.  Has a history of bladder therapy for painful bladder syndrome.  Previously prescribed oxybutynin.  Not currently taking.  Recently started on Coumadin in May 2020 for PE.  Denies any additional bleeding. PT/INR is checked weekly by Dr. Iona Beard at [facility] primary.  Denies any acute abdominal pain.  Has history of right upper quadrant and is followed by Dr. Allen Norris.  I recommended she be seen in clinic today.  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  She states "she feels fine" and shewould not be able to come in today for an appointment or labwork.  She does have a 4 PM virtual appointment with her PCP Dr. Iona Beard today.  I encouraged her to make sure he was aware of her symptoms.  She states she is having her PT/INR checked on Monday.  I asked if she would be available to stop by our clinic to provide [place] with a urinalysis and blood work (CBC).  She was agreeable.  Patient stated she would call me back with a good time for her to swing by the clinic.  Orders are placed.  Faythe Casa, NP 03/31/2019 4:30 PM

## 2019-04-02 NOTE — Progress Notes (Signed)
[facility]  [address], [address] [place], [place] [address] Phone: [phone]  Fax: [phone]   Clinic Day:  04/03/2019  Referring physician: Sharyne Peach, MD  Chief Complaint: Angela [name] is a 43 y.o. female with recurrent pulmonary embolismwho isseen for 2 month assessment.   HPI: The patient was last seen in the hematology clinic via telemedicine on 01/30/2019. At that time, she had chronic shortness of breath.  She continued to have LUQ abdominal pain.  She was seen in the ER on 02/06/2019 for bilateral lower extremity swelling and shortness of breath.  Bilateral lower extremity duplex was negative for DVT. She was discharged to follow-up in cardiology.   She was seen by Kristian Covey, RD on 02/16/2019 for desire of weight loss and consideration of gastric sleeve surgery. She was recommended to be seen in the Nutrition and [facility] Education [facility] at [facility].  Dr. Iona Beard has been following her PT/INR, although she has been non-compliant with appointments. INR was 1.5 on 03/08/2019, 1.9 on 04/03/2019, and 2.0 on 04/03/2019.  She is on Coumadin 5 mg a day 5 days/week and 7.5 mg 2 days/week (Tuesday and Thursday).  She contacted the clinic on 03/31/2019 regarding hematuria x 1 month. She was to follow-up in clinic for CBC and urinalysis.  Symptomatically, she denies any "clots" in her urine, but notes some stringy blood in his first morning urine.  She also notes some vaginal bleeding 2 days after intercourse.  She has chronic shortness of breath.  She notes an ovarian cyst last year.  She has an echo at [facility] on 04/07/2019.   Past Medical History:  Diagnosis Date  . Anemia   . Anxiety   . Asthma   . Depression   . Fibromyalgia   . GERD (gastroesophageal reflux disease)   . Hypertension   . Irritable bowel syndrome (IBS)   . Pseudotumor cerebri   . Pulmonary embolism ([place])   . Raynaud's disease   . Thyroid disease    hypothyroid  .  Vision disturbance     Past Surgical History:  Procedure Laterality Date  . ABDOMINAL HYSTERECTOMY  02/06/13  . BREAST BIOPSY Right October, 2014   right breast core biopsy, fibroadenomatous changes  . BREAST BIOPSY Right December 2014   FNA retroareolar nodule consistent with fibroadenoma.  Marland Kitchen BREAST BIOPSY Right 12/16/2016   Fibroadenoma in the retroareolar area at 6:00.  Marland Kitchen [place]  . COLONOSCOPY  2015   Dr Allen Norris  . RHINOPLASTY    . TONSILECTOMY, ADENOIDECTOMY, BILATERAL MYRINGOTOMY AND TUBES  2004  . UPPER GASTROINTESTINAL ENDOSCOPY  2015   Dr Allen Norris    Family History  Problem Relation Age of Onset  . Depression Mother   . Migraines Mother   . Diverticulitis Mother   . Hypertension Mother   . Heart disease Father   . Diabetes Father   . Hypertension Father   . Stroke Sister   . Cancer Maternal Aunt   . Cancer Maternal Grandmother     Social History:  reports that she has never smoked. She has never used smokeless tobacco. She reports current alcohol use. She reports that she does not use drugs. She denies drug use. She drinks alcohol occasionally, in social settings. She has 3 children and 3 grandchildren.She lives in [place].The patient is alone today.  Allergies:  Allergies  Allergen Reactions  . Amoxicillin Hives  . Lisinopril Cough  . Nortriptyline   . Other  Sulfa eye drops- Scratched corneas  . Penicillins Hives  . Sulfa Antibiotics     Current Medications: Current Outpatient Medications  Medication Sig Dispense Refill  . acetaZOLAMIDE (DIAMOX) 250 MG tablet Take 650 mg by mouth 3 (three) times daily.    Marland Kitchen albuterol (PROVENTIL HFA;VENTOLIN HFA) 108 (90 BASE) MCG/ACT inhaler Inhale 2 puffs into the lungs every 6 (six) hours as needed for wheezing.    . Biotin 1000 MCG tablet Take 1,000 mcg by mouth 4 (four) times a week.    Marland Kitchen buPROPion (WELLBUTRIN SR) 150 MG 12 hr tablet Take 150 mg by mouth daily.    .  butalbital-acetaminophen-caffeine (FIORICET) 50-325-40 MG tablet Take 1 tablet by mouth as needed.    . cyclobenzaprine (FLEXERIL) 10 MG tablet Take 1 tablet by mouth as needed.    . diazepam (VALIUM) 5 MG tablet Take 5 mg by mouth every 8 (eight) hours as needed.     . Difluprednate (DUREZOL) 0.05 % EMUL Durezol 0.05 % eye drops  PLACE 1 DROP INTO RIGHT EYE EVERY DAY FOR 7 DAYS    . diphenhydrAMINE (BENADRYL) 25 mg capsule Take 25 mg by mouth every 6 (six) hours as needed.     . Galcanezumab-gnlm 120 MG/ML SOAJ Inject into the skin.    Marland Kitchen levothyroxine (SYNTHROID) 150 MCG tablet Take 150 mcg by mouth daily before breakfast.     . linaclotide (LINZESS) 145 MCG CAPS capsule Take 1 capsule (145 mcg total) by mouth daily before breakfast. (Patient taking differently: Take 145 mcg by mouth as needed. ) 90 capsule 3  . Melatonin 5 MG CAPS Take 1 tablet by mouth at bedtime.    Marland Kitchen omeprazole (PRILOSEC) 20 MG capsule Take 20 mg by mouth every other day.     . ondansetron (ZOFRAN) 8 MG tablet Take 8 mg by mouth every 8 (eight) hours as needed for nausea or vomiting.    . prednisoLONE acetate (PRED FORTE) 1 % ophthalmic suspension Place 1 drop into both eyes every 4 (four) hours.    . promethazine (PHENERGAN) 25 MG tablet Take 25 mg by mouth every 6 (six) hours as needed for nausea or vomiting.    . rosuvastatin (CRESTOR) 20 MG tablet Take 20 mg by mouth daily.    . sodium fluoride (DENTAGEL) 1.1 % GEL dental gel DentaGel 1.1 %  AFTER BRUSHING AND FLOSSING BRUSH WITH GEL FOR 3 MINUTES ONCE A DAY, DO NOT EAT,DRINK FOR 30 MINUTES    . tiZANidine (ZANAFLEX) 4 MG tablet Take 4 mg by mouth every 8 (eight) hours as needed for muscle spasms.    Marland Kitchen topiramate (TOPAMAX) 100 MG tablet Take 100 mg by mouth 2 (two) times daily.     Marland Kitchen warfarin (COUMADIN) 5 MG tablet Take 5 mg every day except 7.5 mg on Tuesdays. 30 tablet 0  . warfarin (COUMADIN) 7.5 MG tablet Take 1 tablet (7.5 mg total) by mouth daily. (Patient taking  differently: Take 7.5 mg by mouth daily. On Tuesdays) 30 tablet 11  . Buprenorphine HCl (BELBUCA) 150 MCG FILM Place 150 mcg inside cheek daily.    Marland Kitchen EPINEPHrine (EPIPEN 2-PAK) 0.3 mg/0.3 mL IJ SOAJ injection EpiPen 2-Pak 0.3 mg/0.3 mL injection, auto-injector  INJECT 0.3 MLS (0.3 MG TOTAL) INTO THE MUSCLE ONCE AS NEEDED FOR ANAPHYLAXIS FOR UP TO 1 DOSE.    Marland Kitchen oxybutynin (DITROPAN-XL) 10 MG 24 hr tablet Take 10 mg by mouth at bedtime.      No current facility-administered medications for  this visit.     Review of Systems  Constitutional: Positive for malaise/fatigue. Negative for chills, diaphoresis, fever and weight loss (up 7 pounds since 12/2018).       "I've been better".  HENT: Negative.  Negative for congestion, ear discharge, ear pain, nosebleeds, sinus pain and sore throat.   Eyes: Positive for blurred vision (vision issues x7 years). Negative for double vision, photophobia and pain.  Respiratory: Positive for shortness of breath (chronic). Negative for cough, hemoptysis and wheezing.   Cardiovascular: Negative.  Negative for chest pain, orthopnea, leg swelling and PND.  Gastrointestinal: Positive for abdominal pain (left sided). Negative for blood in stool, constipation, diarrhea, melena and vomiting.       Irritable bowel syndrome (IBS).   Genitourinary: Negative for urgency.       Occasional bladder spasm.  Stringy blood in urine.  Vaginal bleeding 2 days after intercourse.  Musculoskeletal: Positive for back pain (s/p cortisone injections) and joint pain (knees, hips). Negative for myalgias.       Fibromylagia.   Skin: Negative.  Negative for rash.  Neurological: Positive for headaches. Negative for dizziness, tingling, sensory change, speech change, focal weakness, seizures and weakness.  Endo/Heme/Allergies: Negative.  Does not bruise/bleed easily.  Psychiatric/Behavioral: Negative for memory loss. The patient has insomnia (difficulty sleeping). The patient is not  nervous/anxious.   All other systems reviewed and are negative.  Performance status (ECOG): 2  Vitals Blood pressure (!) 133/99, pulse 79, resp. rate 18, height 5\' 3"  (1.6 m), weight 297 lb 8.2 oz (134.9 kg), SpO2 100 %.   Physical Exam  Constitutional: She is oriented to person, place, and time. She appears well-developed and well-nourished.  Heavyset woman sitting comfortably in the exam room in no acute distress.  HENT:  Head: Normocephalic and atraumatic.  Mouth/Throat: Oropharynx is clear and moist. No oropharyngeal exudate.  Long dark hair.  Eyes: Pupils are equal, round, and reactive to light. Conjunctivae and EOM are normal. No scleral icterus.  Glasses.  Blue contact lenses.  Neck: Normal range of motion. Neck supple.  Cardiovascular: Normal rate, regular rhythm and normal heart sounds.  No murmur heard. Pulmonary/Chest: Effort normal and breath sounds normal. No respiratory distress. She has no wheezes. She has no rales.  Abdominal: Soft. Bowel sounds are normal. She exhibits no distension and no mass. There is abdominal tenderness. There is no rebound and no guarding.  Slight LUQ and LLQ discomfort.  Musculoskeletal: Normal range of motion.        General: No edema.  Lymphadenopathy:    She has no cervical adenopathy.    She has no axillary adenopathy.       Right: No supraclavicular adenopathy present.       Left: No supraclavicular adenopathy present.  Neurological: She is alert and oriented to person, place, and time.  Skin: Skin is warm and dry. No rash noted. She is not diaphoretic. No erythema. No pallor.  Psychiatric: She has a normal mood and affect. Her behavior is normal. Judgment and thought content normal.  Nursing note and vitals reviewed.   No visits with results within 3 Day(s) from this visit.  Latest known visit with results is:  Orders Only on 02/27/2019  Component Date Value Ref Range Status  . SARS-CoV-2, NAA 02/27/2019 Not Detected  Not Detected  Final   Comment: Testing was performed using the Aptima SARS-CoV-2 assay. This test was developed and its performance characteristics determined by [employer]. This test has not been FDA  cleared or approved. This test has been authorized by FDA under an Emergency Use Authorization (EUA). This test is only authorized for the duration of time the declaration that circumstances exist justifying the authorization of the emergency use of in vitro diagnostic tests for detection of SARS-CoV-2 virus and/or diagnosis of COVID-19 infection under section 564(b)(1) of the Act, 21 U.S.C. KA:123727), unless the authorization is terminated or revoked sooner. When diagnostic testing is negative, the possibility of a false negative result should be considered in the context of a patient's recent exposures and the presence of clinical signs and symptoms consistent with COVID-19. An individual without symptoms of COVID-19 and who is not shedding SARS-CoV-2 virus would expect to have a negativ                          e (not detected) result in this assay.     Assessment:  Angela [name] is a 43 y.o. 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 She is on Coumadin.  She is currently taking 5 mg daily except 7.5 mg on Tuesdays.  Work-up on 12/22/2018 included the following normal/negative labs: CBC with diff, lupus anticoagulant, protein C activity, and protein S activity/antigen.Normal labson 01/06/2019 included: Factor V Leiden, prothrombin gene mutation, anticardiolipin antibodies, and beta2-glycoprotein antibodies.  Chest CT angiogramon 12/20/2018 revealed a wedge-shaped opacity in the right lower lobelateral basal segment consistent with a pulmonary infarct. There was relative hypoenhancement within the associated segmental artery probablysecondary toa small pulmonary embolus, though it wasdifficult to assessgiven limitationsof beam attenuation caused by  the patient's body habitus. There was a small right pleural effusion with associated atelectasis.  Bilateral lower extremity duplexon 12/21/2018 revealed no femoropopliteal and no calf DVT in the visualized calf veins.  AbdomenandpelvisCTon 01/16/2019 revealed no acute findings. There was a new 2 cm pleural-based nodule in posterior right lower lobe.  PET scan on 01/26/2019 revealed no hypermetabolic findings highly suspicious for a malignant process. The pleural-based right lung base pulmonary nodule demonstrated no significant metabolism, was slightly decreased from 01/16/2019 CT abdomen study, and was substantially decreased from 12/20/2018 chest CT angiogram study, most compatible with a resolving pulmonary infarct   Symptomatically, she continues to have abdominal discomfort.  She describes vaginal bleeding 2 days after intercourse and a history of stringy hematuria.  Plan: 1.   Labs today:  CBC with diff, PT/INR. 2.   Urinalysis. 3. Recurrent pulmonary embolism Patient with pulmonary embolism in 2006 and 2020. Work-up is negative.  PET scan revealed no malignancy. She is currently taking Coumadin 5 mg 5 days/week and 7.5 mg 2 days/week.             INR is 2.0 today.  Goal INR is 2-3.             Continue INRs with Dr. Iona Beard.             Plan is for long term anticoagulation. 3.LUQ pain Etiology remains unclear.  Abdomen and pelvic CT as well as PET scan unremarkable.  Follow-up with GI. 4.   Weight loss, resolved             Weight improved.  Continue to monitor.. 5.   Hematuria and vaginal bleeding  Urinalysis today.  Follow-up with Dr. Iona Beard. 6.   RTC in 3 months for MD assessment and labs (TBD).  I discussed the assessment and treatment plan with the patient.  The patient was provided an opportunity to ask questions  and all were answered.  The patient agreed with the plan and demonstrated an  understanding of the instructions.  The patient was advised to call back if the symptoms worsen or if the condition fails to improve as anticipated.   Lequita Asal, MD, PhD    04/03/2019, 11:58 AM

## 2019-04-02 NOTE — Progress Notes (Signed)
Confirmed Name, DOB, and Address. Patient reports blood clots have been present in urine since approx April or may. States she continues to have an extended abdomen as well as LUQ pain that has been monitored several times with all negative exams. Patient is also complaining of decreased appetite.

## 2019-04-03 ENCOUNTER — Encounter: Payer: Self-pay | Admitting: Hematology and Oncology

## 2019-04-03 ENCOUNTER — Ambulatory Visit: Payer: Medicare Other

## 2019-04-03 ENCOUNTER — Other Ambulatory Visit: Payer: Self-pay

## 2019-04-03 ENCOUNTER — Inpatient Hospital Stay: Payer: Medicare Other | Attending: Hematology and Oncology | Admitting: Hematology and Oncology

## 2019-04-03 VITALS — BP 133/99 | HR 79 | Resp 18 | Ht 63.0 in | Wt 297.5 lb

## 2019-04-03 DIAGNOSIS — R635 Abnormal weight gain: Secondary | ICD-10-CM | POA: Diagnosis not present

## 2019-04-03 DIAGNOSIS — Z809 Family history of malignant neoplasm, unspecified: Secondary | ICD-10-CM | POA: Diagnosis not present

## 2019-04-03 DIAGNOSIS — Z86711 Personal history of pulmonary embolism: Secondary | ICD-10-CM | POA: Diagnosis present

## 2019-04-03 DIAGNOSIS — Z7901 Long term (current) use of anticoagulants: Secondary | ICD-10-CM

## 2019-04-03 DIAGNOSIS — R1012 Left upper quadrant pain: Secondary | ICD-10-CM | POA: Diagnosis not present

## 2019-04-03 DIAGNOSIS — I10 Essential (primary) hypertension: Secondary | ICD-10-CM | POA: Insufficient documentation

## 2019-04-03 DIAGNOSIS — F419 Anxiety disorder, unspecified: Secondary | ICD-10-CM | POA: Insufficient documentation

## 2019-04-03 DIAGNOSIS — Z9119 Patient's noncompliance with other medical treatment and regimen: Secondary | ICD-10-CM | POA: Diagnosis not present

## 2019-04-03 DIAGNOSIS — Z79899 Other long term (current) drug therapy: Secondary | ICD-10-CM | POA: Diagnosis not present

## 2019-04-03 DIAGNOSIS — I2699 Other pulmonary embolism without acute cor pulmonale: Secondary | ICD-10-CM

## 2019-04-03 DIAGNOSIS — E039 Hypothyroidism, unspecified: Secondary | ICD-10-CM | POA: Insufficient documentation

## 2019-04-03 DIAGNOSIS — M797 Fibromyalgia: Secondary | ICD-10-CM | POA: Diagnosis not present

## 2019-04-03 DIAGNOSIS — I7300 Raynaud's syndrome without gangrene: Secondary | ICD-10-CM | POA: Diagnosis not present

## 2019-04-03 DIAGNOSIS — K219 Gastro-esophageal reflux disease without esophagitis: Secondary | ICD-10-CM | POA: Diagnosis not present

## 2019-04-03 DIAGNOSIS — J45909 Unspecified asthma, uncomplicated: Secondary | ICD-10-CM | POA: Insufficient documentation

## 2019-04-03 DIAGNOSIS — R319 Hematuria, unspecified: Secondary | ICD-10-CM

## 2019-04-03 DIAGNOSIS — R0602 Shortness of breath: Secondary | ICD-10-CM | POA: Diagnosis not present

## 2019-04-03 DIAGNOSIS — R911 Solitary pulmonary nodule: Secondary | ICD-10-CM | POA: Diagnosis not present

## 2019-04-03 DIAGNOSIS — K589 Irritable bowel syndrome without diarrhea: Secondary | ICD-10-CM | POA: Insufficient documentation

## 2019-04-03 LAB — CBC WITH DIFFERENTIAL/PLATELET
Abs Immature Granulocytes: 0.02 10*3/uL (ref 0.00–0.07)
Basophils Absolute: 0.1 10*3/uL (ref 0.0–0.1)
Basophils Relative: 1 %
Eosinophils Absolute: 0.1 10*3/uL (ref 0.0–0.5)
Eosinophils Relative: 2 %
HCT: 38.4 % (ref 36.0–46.0)
Hemoglobin: 12.8 g/dL (ref 12.0–15.0)
Immature Granulocytes: 0 %
Lymphocytes Relative: 24 %
Lymphs Abs: 2.1 10*3/uL (ref 0.7–4.0)
MCH: 28.6 pg (ref 26.0–34.0)
MCHC: 33.3 g/dL (ref 30.0–36.0)
MCV: 85.9 fL (ref 80.0–100.0)
Monocytes Absolute: 0.4 10*3/uL (ref 0.1–1.0)
Monocytes Relative: 5 %
Neutro Abs: 6.1 10*3/uL (ref 1.7–7.7)
Neutrophils Relative %: 68 %
Platelets: 344 10*3/uL (ref 150–400)
RBC: 4.47 MIL/uL (ref 3.87–5.11)
RDW: 13.6 % (ref 11.5–15.5)
WBC: 8.9 10*3/uL (ref 4.0–10.5)
nRBC: 0 % (ref 0.0–0.2)

## 2019-04-03 LAB — URINALYSIS, COMPLETE (UACMP) WITH MICROSCOPIC
Bilirubin Urine: NEGATIVE
Glucose, UA: NEGATIVE mg/dL
Hgb urine dipstick: NEGATIVE
Ketones, ur: NEGATIVE mg/dL
Leukocytes,Ua: NEGATIVE
Nitrite: NEGATIVE
Protein, ur: NEGATIVE mg/dL
Specific Gravity, Urine: 1.015 (ref 1.005–1.030)
pH: 5.5 (ref 5.0–8.0)

## 2019-04-03 LAB — FERRITIN: Ferritin: 62 ng/mL (ref 11–307)

## 2019-04-03 NOTE — Progress Notes (Signed)
Patient c/o lower abdomen burning pain noted today/ occ pain

## 2019-04-04 ENCOUNTER — Encounter: Payer: Self-pay | Admitting: Gastroenterology

## 2019-04-04 ENCOUNTER — Ambulatory Visit (INDEPENDENT_AMBULATORY_CARE_PROVIDER_SITE_OTHER): Payer: Medicare Other | Admitting: Gastroenterology

## 2019-04-04 ENCOUNTER — Encounter: Payer: Self-pay | Admitting: Hematology and Oncology

## 2019-04-04 VITALS — BP 136/94 | HR 105 | Temp 98.5°F | Ht 63.0 in | Wt 296.8 lb

## 2019-04-04 DIAGNOSIS — M7918 Myalgia, other site: Secondary | ICD-10-CM

## 2019-04-04 NOTE — Progress Notes (Signed)
Gastroenterology Consultation  Referring Provider:     Lequita Asal, MD Primary Care Physician:  Sharyne Peach, MD Primary Gastroenterologist:  Dr. Allen Norris     Reason for Consultation:     LUQ pain        HPI:   Angela [name] is a 43 y.o. y/o female referred for consultation & management of LUQ pain by Dr. Iona Beard, Rubbie Battiest, MD.  This patient comes in today with a history of a pulmonary embolus and chronic right upper quadrant pain.  The patient's PEs have been deemed recurrent and she is following with oncology/hematology for this.  The patient reports that she has been told that she has on lifelong anticoagulation because of this being her second pulmonary embolus. The patient abdominal pain is worse when she moves around and bends over.  She also suffers from chronic back pain.  The patient reports the pain to be a burning type pain and she says it feels better when she massages it.  It is not associated with any eating or drinking.  She also does not report that moving her bowels make the pain any better or worse.  There is no report of any unexplained weight loss and in fact the patient states that she has gained weight.  The patient reports that she has struggled with her weight since she was a teenager.  There is no report of any black stools or bloody stools. The patient had seen me back in 2017 for alternating diarrhea and constipation with abdominal pain and was treated with Linzess for her constipation.  Past Medical History:  Diagnosis Date  . Anemia   . Anxiety   . Asthma   . Depression   . Fibromyalgia   . GERD (gastroesophageal reflux disease)   . Hypertension   . Irritable bowel syndrome (IBS)   . Pseudotumor cerebri   . Pulmonary embolism ([place])   . Raynaud's disease   . Thyroid disease    hypothyroid  . Vision disturbance     Past Surgical History:  Procedure Laterality Date  . ABDOMINAL HYSTERECTOMY  02/06/13  . BREAST BIOPSY Right October, 2014   right breast core biopsy, fibroadenomatous changes  . BREAST BIOPSY Right December 2014   FNA retroareolar nodule consistent with fibroadenoma.  Marland Kitchen BREAST BIOPSY Right 12/16/2016   Fibroadenoma in the retroareolar area at 6:00.  Marland Kitchen [place]  . COLONOSCOPY  2015   Dr Allen Norris  . RHINOPLASTY    . TONSILECTOMY, ADENOIDECTOMY, BILATERAL MYRINGOTOMY AND TUBES  2004  . UPPER GASTROINTESTINAL ENDOSCOPY  2015   Dr Allen Norris    Prior to Admission medications   Medication Sig Start Date End Date Taking? Authorizing Provider  acetaZOLAMIDE (DIAMOX) 250 MG tablet Take 650 mg by mouth 3 (three) times daily.    [provider]  albuterol (PROVENTIL HFA;VENTOLIN HFA) 108 (90 BASE) MCG/ACT inhaler Inhale 2 puffs into the lungs every 6 (six) hours as needed for wheezing.    [provider]  Biotin 1000 MCG tablet Take 1,000 mcg by mouth 4 (four) times a week.    [provider]  Buprenorphine HCl (BELBUCA) 150 MCG FILM Place 150 mcg inside cheek daily. 10/11/18   [provider]  buPROPion (WELLBUTRIN SR) 150 MG 12 hr tablet Take 150 mg by mouth daily.    [provider]  butalbital-acetaminophen-caffeine (FIORICET) 50-325-40 MG tablet Take 1 tablet by mouth as needed. 11/17/16   [provider]  cyclobenzaprine (FLEXERIL) 10 MG tablet Take 1 tablet by mouth as needed.    [provider]  diazepam (VALIUM) 5 MG tablet Take 5 mg by mouth every 8 (eight) hours as needed.  06/06/18   [provider]  Difluprednate (DUREZOL) 0.05 % EMUL Durezol 0.05 % eye drops  PLACE 1 DROP INTO RIGHT EYE EVERY DAY FOR 7 DAYS    [provider]  diphenhydrAMINE (BENADRYL) 25 mg capsule Take 25 mg by mouth every 6 (six) hours as needed.     [provider]  EPINEPHrine (EPIPEN 2-PAK) 0.3 mg/0.3 mL IJ SOAJ injection EpiPen 2-Pak 0.3 mg/0.3 mL injection, auto-injector  INJECT 0.3 MLS (0.3 MG TOTAL) INTO THE MUSCLE ONCE AS  NEEDED FOR ANAPHYLAXIS FOR UP TO 1 DOSE.    [provider]  Galcanezumab-gnlm 120 MG/ML SOAJ Inject into the skin. 01/30/19   [provider]  levothyroxine (SYNTHROID) 150 MCG tablet Take 150 mcg by mouth daily before breakfast.     [provider]  linaclotide (LINZESS) 145 MCG CAPS capsule Take 1 capsule (145 mcg total) by mouth daily before breakfast. Patient taking differently: Take 145 mcg by mouth as needed.  11/16/16   Lucilla Lame, MD  Melatonin 5 MG CAPS Take 1 tablet by mouth at bedtime.    [provider]  omeprazole (PRILOSEC) 20 MG capsule Take 20 mg by mouth every other day.     [provider]  ondansetron (ZOFRAN) 8 MG tablet Take 8 mg by mouth every 8 (eight) hours as needed for nausea or vomiting.    [provider]  oxybutynin (DITROPAN-XL) 10 MG 24 hr tablet Take 10 mg by mouth at bedtime.  01/25/18   [provider]  prednisoLONE acetate (PRED FORTE) 1 % ophthalmic suspension Place 1 drop into both eyes every 4 (four) hours.    [provider]  promethazine (PHENERGAN) 25 MG tablet Take 25 mg by mouth every 6 (six) hours as needed for nausea or vomiting.    [provider]  rosuvastatin (CRESTOR) 20 MG tablet Take 20 mg by mouth daily.    [provider]  sodium fluoride (DENTAGEL) 1.1 % GEL dental gel DentaGel 1.1 %  AFTER BRUSHING AND FLOSSING BRUSH WITH GEL FOR 3 MINUTES ONCE A DAY, DO NOT EAT,DRINK FOR 30 MINUTES    [provider]  tiZANidine (ZANAFLEX) 4 MG tablet Take 4 mg by mouth every 8 (eight) hours as needed for muscle spasms.    [provider]  topiramate (TOPAMAX) 100 MG tablet Take 100 mg by mouth 2 (two) times daily.     [provider]  warfarin (COUMADIN) 5 MG tablet Take 5 mg every day except 7.5 mg on Tuesdays. 01/31/19   Lequita Asal, MD  warfarin (COUMADIN) 7.5 MG tablet Take 1 tablet (7.5 mg total) by mouth daily. Patient taking  differently: Take 7.5 mg by mouth daily. On Tuesdays 12/23/18 12/23/19  Epifanio Lesches, MD    Family History  Problem Relation Age of Onset  . Depression Mother   . Migraines Mother   . Diverticulitis Mother   . Hypertension Mother   . Heart disease Father   . Diabetes Father   . Hypertension Father   . Stroke Sister   . Cancer Maternal Aunt   . Cancer Maternal Grandmother      Social History   Tobacco Use  . Smoking status: Never Smoker  . Smokeless tobacco: Never Used  Substance Use Topics  . Alcohol use: Yes    Comment: Occasionally Social Drinker  . Drug use: No    Allergies as of 04/04/2019 - Review Complete 04/03/2019  Allergen Reaction Noted  . Amoxicillin Hives 05/29/2013  . Lisinopril Cough 11/26/2016  . Nortriptyline  12/20/2018  . Other  05/29/2013  . Penicillins Hives 05/29/2013  . Sulfa antibiotics  12/20/2018    Review of Systems:    All systems reviewed and negative except where noted in HPI.   Physical Exam:  There were no vitals taken for this visit. No LMP recorded. Patient has had a hysterectomy. General:   Alert,  Well-developed, morbidly obese, pleasant and cooperative in NAD Head:  Normocephalic and atraumatic. Eyes:  Sclera clear, no icterus.   Conjunctiva pink. Ears:  Normal auditory acuity. Nose:  No deformity, discharge, or lesions. Mouth:  No deformity or lesions,oropharynx pink & moist. Neck:  Supple; no masses or thyromegaly. Lungs:  Respirations even and unlabored.  Clear throughout to auscultation.   No wheezes, crackles, or rhonchi. No acute distress. Heart:  Regular rate and rhythm; no murmurs, clicks, rubs, or gallops. Abdomen:  Normal bowel sounds.  No bruits.  Soft, positive tenderness to one finger palpation while flexing the abdominal wall muscles and non-distended without masses, hepatosplenomegaly or hernias noted.  No guarding or rebound tenderness.  Positive Carnett sign.   Rectal:  Deferred.  Msk:  Symmetrical  without gross deformities.  Good, equal movement & strength bilaterally. Pulses:  Normal pulses noted. Extremities:  No clubbing or edema.  No cyanosis. Neurologic:  Alert and oriented x3;  grossly normal neurologically. Skin:  Intact without significant lesions or rashes.  No jaundice. Lymph Nodes:  No significant cervical adenopathy. Psych:  Alert and cooperative. Normal mood and affect.  Imaging Studies: No results found.  Assessment and Plan:   Angela [name] is a 43 y.o. y/o female who comes in today with a physical exam consistent with musculoskeletal pain.  The patient's characteristic of a burning sensation in addition to it being worse with movement and not associated all with any GI functions support the diagnosis of musculoskeletal pain.  In addition to this the patient has chronic back pain which is commonly seen with people who have " front" pain.  The patient cannot take anti-inflammatory medication since she is on blood thinners and has been told to take Flexeril which she has at home.  She is also been told to use warm compresses and try avoiding any further straining of her abdominal wall muscles.  The patient has been explained the plan and agrees with it.  Lucilla Lame, MD. Marval Regal    Note: This dictation was prepared with Dragon dictation along with smaller phrase technology. Any transcriptional errors that result from this process are unintentional.

## 2019-04-07 ENCOUNTER — Encounter: Payer: Self-pay | Admitting: Hematology and Oncology

## 2019-04-10 MED ORDER — CYCLOBENZAPRINE HCL 10 MG PO TABS: 10.0000 mg | ORAL_TABLET | ORAL | 3 refills | Status: AC | PRN

## 2019-04-10 NOTE — Telephone Encounter (Signed)
Last office visit 04/04/2019 Musculoskeletal pain Last refill Historical medication  No appointment is scheduled

## 2019-05-04 ENCOUNTER — Encounter (INDEPENDENT_AMBULATORY_CARE_PROVIDER_SITE_OTHER): Payer: Medicare Other | Admitting: Vascular Surgery

## 2019-05-19 ENCOUNTER — Encounter: Payer: Self-pay | Admitting: Hematology and Oncology

## 2019-05-22 ENCOUNTER — Ambulatory Visit (INDEPENDENT_AMBULATORY_CARE_PROVIDER_SITE_OTHER): Payer: Medicare Other | Admitting: Vascular Surgery

## 2019-05-22 ENCOUNTER — Encounter (INDEPENDENT_AMBULATORY_CARE_PROVIDER_SITE_OTHER): Payer: Self-pay | Admitting: Vascular Surgery

## 2019-05-22 ENCOUNTER — Other Ambulatory Visit: Payer: Self-pay

## 2019-05-22 ENCOUNTER — Encounter

## 2019-05-22 VITALS — BP 139/91 | HR 94 | Resp 16 | Ht 63.0 in | Wt 301.0 lb

## 2019-05-22 DIAGNOSIS — I872 Venous insufficiency (chronic) (peripheral): Secondary | ICD-10-CM | POA: Diagnosis not present

## 2019-05-22 DIAGNOSIS — M069 Rheumatoid arthritis, unspecified: Secondary | ICD-10-CM

## 2019-05-22 DIAGNOSIS — H209 Unspecified iridocyclitis: Secondary | ICD-10-CM | POA: Insufficient documentation

## 2019-05-22 DIAGNOSIS — I10 Essential (primary) hypertension: Secondary | ICD-10-CM | POA: Diagnosis not present

## 2019-05-22 DIAGNOSIS — I2699 Other pulmonary embolism without acute cor pulmonale: Secondary | ICD-10-CM

## 2019-05-22 DIAGNOSIS — I890 Lymphedema, not elsewhere classified: Secondary | ICD-10-CM | POA: Insufficient documentation

## 2019-05-22 NOTE — Progress Notes (Signed)
MRN : [id number]  Angela [name] is a 43 y.o. ([birth year]) female who presents with chief complaint of No chief complaint on file. Marland Kitchen  History of Present Illness:   The patient presents to the office for evaluation of recurrent PE in spite of coumadin therapy.  The initial symptoms were chest pain and SOB.  She has had at least two episodes of PE.  She is likely to require bariatric surgery in the next few months and at the very least will require an IVC filter at that time  The patient notes the leg continues to be painful with dependency and and they still swell.  Symptoms are much better with elevation.  The patient notes minimal edema in the morning which steadily worsens throughout the day.    The patient has not been using compression therapy at this point.  No SOB or pleuritic chest pains.  No cough or hemoptysis.  No blood per rectum or blood in any sputum.  No excessive bruising per the patient.   No outpatient medications have been marked as taking for the 05/22/19 encounter (Appointment) with Delana Meyer, Dolores Lory, MD.    Past Medical History:  Diagnosis Date  . Anemia   . Anxiety   . Asthma   . Depression   . Fibromyalgia   . GERD (gastroesophageal reflux disease)   . Hypertension   . Irritable bowel syndrome (IBS)   . Pseudotumor cerebri   . Pulmonary embolism ([place])   . Raynaud's disease   . Thyroid disease    hypothyroid  . Vision disturbance     Past Surgical History:  Procedure Laterality Date  . ABDOMINAL HYSTERECTOMY  02/06/13  . BREAST BIOPSY Right October, 2014   right breast core biopsy, fibroadenomatous changes  . BREAST BIOPSY Right December 2014   FNA retroareolar nodule consistent with fibroadenoma.  Marland Kitchen BREAST BIOPSY Right 12/16/2016   Fibroadenoma in the retroareolar area at 6:00.  Marland Kitchen [place]  . COLONOSCOPY  2015   Dr Allen Norris  . RHINOPLASTY    . TONSILECTOMY, ADENOIDECTOMY, BILATERAL MYRINGOTOMY AND TUBES  2004   . UPPER GASTROINTESTINAL ENDOSCOPY  2015   Dr Allen Norris    Social History Social History   Tobacco Use  . Smoking status: Never Smoker  . Smokeless tobacco: Never Used  Substance Use Topics  . Alcohol use: Yes    Comment: Occasionally Social Drinker  . Drug use: No    Family History Family History  Problem Relation Age of Onset  . Depression Mother   . Migraines Mother   . Diverticulitis Mother   . Hypertension Mother   . Heart disease Father   . Diabetes Father   . Hypertension Father   . Stroke Sister   . Cancer Maternal Aunt   . Cancer Maternal Grandmother   No family history of bleeding/clotting disorders, porphyria or autoimmune disease   Allergies  Allergen Reactions  . Amoxicillin Hives  . Lisinopril Cough  . Nortriptyline   . Other     Sulfa eye drops- Scratched corneas  . Penicillins Hives  . Sulfa Antibiotics      REVIEW OF SYSTEMS (Negative unless checked)  Constitutional: [] Weight loss  [] Fever  [] Chills Cardiac: [] Chest pain   [] Chest pressure   [] Palpitations   [] Shortness of breath when laying flat   [] Shortness of breath with exertion. Vascular:  [] Pain in legs with walking   [x] Pain in legs at rest  [x] History of DVT   []   Phlebitis   [x] Swelling in legs   [] Varicose veins   [] Non-healing ulcers Pulmonary:   [] Uses home oxygen   [] Productive cough   [] Hemoptysis   [] Wheeze  [] COPD   [] Asthma Neurologic:  [] Dizziness   [] Seizures   [] History of stroke   [] History of TIA  [] Aphasia   [] Vissual changes   [] Weakness or numbness in arm   [] Weakness or numbness in leg Musculoskeletal:   [] Joint swelling   [] Joint pain   [] Low back pain Hematologic:  [] Easy bruising  [] Easy bleeding   [] Hypercoagulable state   [] Anemic Gastrointestinal:  [] Diarrhea   [] Vomiting  [] Gastroesophageal reflux/heartburn   [] Difficulty swallowing. Genitourinary:  [] Chronic kidney disease   [] Difficult urination  [] Frequent urination   [] Blood in urine Skin:  [] Rashes   [] Ulcers   Psychological:  [] History of anxiety   []  History of major depression.  Physical Examination  There were no vitals filed for this visit. There is no height or weight on file to calculate BMI. Gen: WD/WN, NAD obese Head: [place]/AT, No temporalis wasting.  Ear/Nose/Throat: Hearing grossly intact, nares w/o erythema or drainage, poor dentition Eyes: PER, EOMI, sclera nonicteric.  Neck: Supple, no masses.  No bruit or JVD.  Pulmonary:  Good air movement, clear to auscultation bilaterally, no use of accessory muscles.  Cardiac: RRR, normal S1, S2, no Murmurs. Vascular: scattered varicosities present bilaterally.  Mild venous stasis changes to the legs bilaterally.  2+ soft pitting edema Gastrointestinal: soft, non-distended. No guarding/no peritoneal signs.  Musculoskeletal: M/S 5/5 throughout.  No deformity or atrophy.  Neurologic: CN 2-12 intact. Pain and light touch intact in extremities.  Symmetrical.  Speech is fluent. Motor exam as listed above. Psychiatric: Judgment intact, Mood & affect appropriate for pt's clinical situation. Dermatologic: No rashes or ulcers noted.  No changes consistent with cellulitis. Lymph : No Cervical lymphadenopathy, no lichenification or skin changes of chronic lymphedema.  CBC Lab Results  Component Value Date   WBC 8.9 04/03/2019   HGB 12.8 04/03/2019   HCT 38.4 04/03/2019   MCV 85.9 04/03/2019   PLT 344 04/03/2019    BMET    Component Value Date/Time   NA 138 02/06/2019 1248   NA 140 02/07/2013 0901   K 3.3 (L) 02/06/2019 1248   K 3.7 02/07/2013 0901   CL 116 (H) 02/06/2019 1248   CL 110 (H) 02/07/2013 0901   CO2 18 (L) 02/06/2019 1248   CO2 25 02/07/2013 0901   GLUCOSE 105 (H) 02/06/2019 1248   GLUCOSE 125 (H) 02/07/2013 0901   BUN 9 02/06/2019 1248   BUN 7 02/07/2013 0901   CREATININE 0.87 02/06/2019 1248   CREATININE 1.01 02/07/2013 0901   CALCIUM 8.3 (L) 02/06/2019 1248   CALCIUM 8.7 02/07/2013 0901   GFRNONAA >60 02/06/2019 1248    GFRNONAA >60 02/07/2013 0901   GFRAA >60 02/06/2019 1248   GFRAA >60 02/07/2013 0901   CrCl cannot be calculated (Patient's most recent lab result is older than the maximum 21 days allowed.).  COAG Lab Results  Component Value Date   INR 2.0 (H) 02/06/2019   INR 3.7 (H) 01/06/2019   INR 1.3 (H) 12/23/2018    Radiology No results found.   Assessment/Plan 1. 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  IVC filter is strongly indicated prior to high risk bariatric surgery.  In addition she has had recurrence while on Coumadin and she  is frequently subtherapeutic.  Also especially given the history of recurrent PE.  IVC filter placement will be done. Risk and benefits were reviewed the patient.  Indications for the procedure were reviewed.  All questions were answered, the patient agrees to proceed.   I have had a long discussion with the patient regarding DVT and post phlebitic changes such as swelling and why it  causes symptoms such as pain.  The patient will wear graduated compression stockings class 1 (20-30 mmHg) on a daily basis a prescription was given. The patient will  beginning wearing the stockings first thing in the morning and removing them in the evening. The patient is instructed specifically not to sleep in the stockings.  In addition, behavioral modification including elevation during the day and avoidance of prolonged dependency will be initiated.    The patient will follow-up with me in 6 months after the joint replacement surgery to discuss removal (this was also discussed today and the patient agrees with the plan to have the filter removed).    2. Chronic venous insufficiency No surgery or intervention at this point in time.    I have had a long discussion with the patient regarding venous insufficiency and why it  causes symptoms. I have discussed with the  patient the chronic skin changes that accompany venous insufficiency and the long term sequela such as infection and ulceration.  Patient will begin wearing graduated compression stockings class 1 (20-30 mmHg) or compression wraps on a daily basis a prescription was given. The patient will put the stockings on first thing in the morning and removing them in the evening. The patient is instructed specifically not to sleep in the stockings.    In addition, behavioral modification including several periods of elevation of the lower extremities during the day will be continued. I have demonstrated that proper elevation is a position with the ankles at heart level.  The patient is instructed to begin routine exercise, especially walking on a daily basis  3. Essential (primary) hypertension Continue antihypertensive medications as already ordered, these medications have been reviewed and there are no changes at this time.   4. Rheumatoid arthritis of knee (HCC) Continue NSAID medications as already ordered, these medications have been reviewed and there are no changes at this time.  Continued activity and therapy was stressed.    Hortencia Pilar, MD  05/22/2019 8:35 AM

## 2019-05-23 ENCOUNTER — Other Ambulatory Visit: Payer: Self-pay | Admitting: Family Medicine

## 2019-05-23 ENCOUNTER — Telehealth (INDEPENDENT_AMBULATORY_CARE_PROVIDER_SITE_OTHER): Payer: Self-pay

## 2019-05-23 DIAGNOSIS — N63 Unspecified lump in unspecified breast: Secondary | ICD-10-CM

## 2019-05-23 NOTE — Telephone Encounter (Signed)
I attempted to contact the patient and a message was left for a return call. 

## 2019-05-24 ENCOUNTER — Encounter (INDEPENDENT_AMBULATORY_CARE_PROVIDER_SITE_OTHER): Payer: Self-pay

## 2019-05-24 ENCOUNTER — Telehealth (INDEPENDENT_AMBULATORY_CARE_PROVIDER_SITE_OTHER): Payer: Self-pay

## 2019-05-24 NOTE — Telephone Encounter (Signed)
Patient called back and is now scheduled for IVC filter placement with Dr. Delana Meyer on 05/31/2019 with a 11:45 am arrival time to the [facility]. Patient will do her Covid testing on 05/26/2019 between 12:30-2:30 pm at the [facility]. Pre-procedure instructions were discussed and will be mailed to the patient.

## 2019-05-24 NOTE — Telephone Encounter (Signed)
I attempted to contact the patient and a message was left for a return call. 

## 2019-05-26 ENCOUNTER — Other Ambulatory Visit
Admission: RE | Admit: 2019-05-26 | Discharge: 2019-05-26 | Disposition: A | Discharge status: Home / Self Care | Payer: Medicare Other | Source: Ambulatory Visit | Attending: Vascular Surgery | Admitting: Vascular Surgery

## 2019-05-26 ENCOUNTER — Other Ambulatory Visit: Payer: Self-pay

## 2019-05-26 ENCOUNTER — Emergency Department: Payer: Medicare Other

## 2019-05-26 ENCOUNTER — Inpatient Hospital Stay
Admission: EM | Admit: 2019-05-26 | Discharge: 2019-05-30 | DRG: 378 | Disposition: A | Discharge status: Home / Self Care | Payer: Medicare Other | Attending: Internal Medicine | Admitting: Internal Medicine

## 2019-05-26 DIAGNOSIS — Z880 Allergy status to penicillin: Secondary | ICD-10-CM

## 2019-05-26 DIAGNOSIS — K3189 Other diseases of stomach and duodenum: Secondary | ICD-10-CM

## 2019-05-26 DIAGNOSIS — I7300 Raynaud's syndrome without gangrene: Secondary | ICD-10-CM | POA: Diagnosis present

## 2019-05-26 DIAGNOSIS — Z8249 Family history of ischemic heart disease and other diseases of the circulatory system: Secondary | ICD-10-CM | POA: Diagnosis not present

## 2019-05-26 DIAGNOSIS — K648 Other hemorrhoids: Secondary | ICD-10-CM | POA: Diagnosis present

## 2019-05-26 DIAGNOSIS — K5900 Constipation, unspecified: Secondary | ICD-10-CM | POA: Diagnosis present

## 2019-05-26 DIAGNOSIS — K921 Melena: Principal | ICD-10-CM

## 2019-05-26 DIAGNOSIS — I10 Essential (primary) hypertension: Secondary | ICD-10-CM | POA: Diagnosis present

## 2019-05-26 DIAGNOSIS — Z7901 Long term (current) use of anticoagulants: Secondary | ICD-10-CM

## 2019-05-26 DIAGNOSIS — Z823 Family history of stroke: Secondary | ICD-10-CM | POA: Diagnosis not present

## 2019-05-26 DIAGNOSIS — Z833 Family history of diabetes mellitus: Secondary | ICD-10-CM | POA: Diagnosis not present

## 2019-05-26 DIAGNOSIS — H209 Unspecified iridocyclitis: Secondary | ICD-10-CM | POA: Diagnosis present

## 2019-05-26 DIAGNOSIS — K219 Gastro-esophageal reflux disease without esophagitis: Secondary | ICD-10-CM | POA: Diagnosis present

## 2019-05-26 DIAGNOSIS — K5730 Diverticulosis of large intestine without perforation or abscess without bleeding: Secondary | ICD-10-CM | POA: Diagnosis present

## 2019-05-26 DIAGNOSIS — Z79890 Hormone replacement therapy (postmenopausal): Secondary | ICD-10-CM | POA: Diagnosis not present

## 2019-05-26 DIAGNOSIS — K319 Disease of stomach and duodenum, unspecified: Secondary | ICD-10-CM | POA: Diagnosis present

## 2019-05-26 DIAGNOSIS — G43909 Migraine, unspecified, not intractable, without status migrainosus: Secondary | ICD-10-CM | POA: Diagnosis present

## 2019-05-26 DIAGNOSIS — Z818 Family history of other mental and behavioral disorders: Secondary | ICD-10-CM | POA: Diagnosis not present

## 2019-05-26 DIAGNOSIS — K30 Functional dyspepsia: Secondary | ICD-10-CM | POA: Diagnosis present

## 2019-05-26 DIAGNOSIS — M069 Rheumatoid arthritis, unspecified: Secondary | ICD-10-CM | POA: Diagnosis present

## 2019-05-26 DIAGNOSIS — E039 Hypothyroidism, unspecified: Secondary | ICD-10-CM | POA: Diagnosis present

## 2019-05-26 DIAGNOSIS — Z888 Allergy status to other drugs, medicaments and biological substances status: Secondary | ICD-10-CM

## 2019-05-26 DIAGNOSIS — Z86711 Personal history of pulmonary embolism: Secondary | ICD-10-CM | POA: Diagnosis not present

## 2019-05-26 DIAGNOSIS — I2699 Other pulmonary embolism without acute cor pulmonale: Secondary | ICD-10-CM | POA: Diagnosis not present

## 2019-05-26 DIAGNOSIS — R059 Cough, unspecified: Secondary | ICD-10-CM

## 2019-05-26 DIAGNOSIS — G932 Benign intracranial hypertension: Secondary | ICD-10-CM | POA: Diagnosis present

## 2019-05-26 DIAGNOSIS — Z809 Family history of malignant neoplasm, unspecified: Secondary | ICD-10-CM | POA: Diagnosis not present

## 2019-05-26 DIAGNOSIS — J45909 Unspecified asthma, uncomplicated: Secondary | ICD-10-CM | POA: Diagnosis present

## 2019-05-26 DIAGNOSIS — K5790 Diverticulosis of intestine, part unspecified, without perforation or abscess without bleeding: Secondary | ICD-10-CM

## 2019-05-26 DIAGNOSIS — F418 Other specified anxiety disorders: Secondary | ICD-10-CM | POA: Diagnosis present

## 2019-05-26 DIAGNOSIS — M797 Fibromyalgia: Secondary | ICD-10-CM | POA: Diagnosis present

## 2019-05-26 DIAGNOSIS — Z20828 Contact with and (suspected) exposure to other viral communicable diseases: Secondary | ICD-10-CM | POA: Insufficient documentation

## 2019-05-26 DIAGNOSIS — Z01812 Encounter for preprocedural laboratory examination: Secondary | ICD-10-CM | POA: Insufficient documentation

## 2019-05-26 DIAGNOSIS — K922 Gastrointestinal hemorrhage, unspecified: Secondary | ICD-10-CM | POA: Diagnosis present

## 2019-05-26 DIAGNOSIS — Z881 Allergy status to other antibiotic agents status: Secondary | ICD-10-CM

## 2019-05-26 DIAGNOSIS — Z79899 Other long term (current) drug therapy: Secondary | ICD-10-CM | POA: Diagnosis not present

## 2019-05-26 DIAGNOSIS — Z91018 Allergy to other foods: Secondary | ICD-10-CM

## 2019-05-26 DIAGNOSIS — R05 Cough: Secondary | ICD-10-CM

## 2019-05-26 DIAGNOSIS — E785 Hyperlipidemia, unspecified: Secondary | ICD-10-CM | POA: Diagnosis present

## 2019-05-26 DIAGNOSIS — I872 Venous insufficiency (chronic) (peripheral): Secondary | ICD-10-CM | POA: Diagnosis present

## 2019-05-26 LAB — COMPREHENSIVE METABOLIC PANEL
ALT: 13 U/L (ref 0–44)
AST: 13 U/L — ABNORMAL LOW (ref 15–41)
Albumin: 3.9 g/dL (ref 3.5–5.0)
Alkaline Phosphatase: 44 U/L (ref 38–126)
Anion gap: 9 (ref 5–15)
BUN: 13 mg/dL (ref 6–20)
CO2: 23 mmol/L (ref 22–32)
Calcium: 9.2 mg/dL (ref 8.9–10.3)
Chloride: 108 mmol/L (ref 98–111)
Creatinine, Ser: 0.9 mg/dL (ref 0.44–1.00)
GFR calc Af Amer: >60 mL/min (ref >60)
GFR calc non Af Amer: >60 mL/min (ref >60)
Glucose, Bld: 99 mg/dL (ref 70–99)
Potassium: 4.1 mmol/L (ref 3.5–5.1)
Sodium: 140 mmol/L (ref 135–145)
Total Bilirubin: 0.7 mg/dL (ref 0.3–1.2)
Total Protein: 7.2 g/dL (ref 6.5–8.1)

## 2019-05-26 LAB — CBC
HCT: 39.1 % (ref 36.0–46.0)
Hemoglobin: 13.2 g/dL (ref 12.0–15.0)
MCH: 28.6 pg (ref 26.0–34.0)
MCHC: 33.8 g/dL (ref 30.0–36.0)
MCV: 84.6 fL (ref 80.0–100.0)
Platelets: 314 10*3/uL (ref 150–400)
RBC: 4.62 MIL/uL (ref 3.87–5.11)
RDW: 13.5 % (ref 11.5–15.5)
WBC: 9.4 10*3/uL (ref 4.0–10.5)
nRBC: 0 % (ref 0.0–0.2)

## 2019-05-26 LAB — PROTIME-INR
INR: 1 (ref 0.8–1.2)
Prothrombin Time: 13.1 seconds (ref 11.4–15.2)

## 2019-05-26 LAB — TYPE AND SCREEN
ABO/RH(D): O POS
Antibody Screen: NEGATIVE

## 2019-05-26 LAB — SARS CORONAVIRUS 2 (TAT 6-24 HRS): SARS Coronavirus 2: NEGATIVE

## 2019-05-26 MED ORDER — ONDANSETRON HCL 4 MG/2ML IJ SOLN
4.0000 mg | Freq: Once | INTRAMUSCULAR | Status: AC
  Administered 2019-05-26: 4 mg via INTRAVENOUS
  Filled 2019-05-26: qty 2

## 2019-05-26 MED ORDER — OXYBUTYNIN CHLORIDE 5 MG PO TABS
5.0000 mg | ORAL_TABLET | Freq: Every day | ORAL | Status: DC
  Administered 2019-05-27 – 2019-05-29 (×4): 5 mg via ORAL
  Filled 2019-05-26 (×4): qty 1

## 2019-05-26 MED ORDER — PANTOPRAZOLE SODIUM 40 MG IV SOLR
40.0000 mg | Freq: Two times a day (BID) | INTRAVENOUS | Status: DC
  Administered 2019-05-26 – 2019-05-27 (×2): 40 mg via INTRAVENOUS
  Filled 2019-05-26 (×2): qty 40

## 2019-05-26 MED ORDER — DIFLUPREDNATE 0.05 % OP EMUL: 1.0000 [drp] | Freq: Four times a day (QID) | OPHTHALMIC | Status: DC | PRN

## 2019-05-26 MED ORDER — SODIUM FLUORIDE 1.1 % DT GEL: 1.0000 "application " | Freq: Every day | DENTAL | Status: DC

## 2019-05-26 MED ORDER — PROMETHAZINE HCL 25 MG PO TABS: 25.0000 mg | ORAL_TABLET | Freq: Four times a day (QID) | ORAL | Status: DC | PRN

## 2019-05-26 MED ORDER — FLUTICASONE PROPIONATE 50 MCG/ACT NA SUSP
1.0000 | Freq: Every day | NASAL | Status: DC | PRN
  Filled 2019-05-26: qty 16

## 2019-05-26 MED ORDER — LINACLOTIDE 72 MCG PO CAPS
72.0000 ug | ORAL_CAPSULE | Freq: Every day | ORAL | Status: DC | PRN
  Filled 2019-05-26: qty 1

## 2019-05-26 MED ORDER — CYCLOBENZAPRINE HCL 10 MG PO TABS
10.0000 mg | ORAL_TABLET | Freq: Three times a day (TID) | ORAL | Status: DC | PRN
  Administered 2019-05-28 (×2): 10 mg via ORAL
  Filled 2019-05-26 (×2): qty 1

## 2019-05-26 MED ORDER — PANTOPRAZOLE SODIUM 40 MG PO TBEC: 40.0000 mg | DELAYED_RELEASE_TABLET | Freq: Every day | ORAL | Status: DC

## 2019-05-26 MED ORDER — ROSUVASTATIN CALCIUM 10 MG PO TABS
20.0000 mg | ORAL_TABLET | Freq: Every day | ORAL | Status: DC
  Administered 2019-05-27 – 2019-05-29 (×3): 20 mg via ORAL
  Filled 2019-05-26 (×3): qty 2

## 2019-05-26 MED ORDER — ONDANSETRON HCL 4 MG/2ML IJ SOLN
4.0000 mg | Freq: Four times a day (QID) | INTRAMUSCULAR | Status: DC | PRN
  Administered 2019-05-28 – 2019-05-30 (×2): 4 mg via INTRAVENOUS
  Filled 2019-05-26 (×2): qty 2

## 2019-05-26 MED ORDER — BUPROPION HCL ER (SR) 150 MG PO TB12
150.0000 mg | ORAL_TABLET | Freq: Two times a day (BID) | ORAL | Status: DC
  Administered 2019-05-27 – 2019-05-30 (×8): 150 mg via ORAL
  Filled 2019-05-26 (×9): qty 1

## 2019-05-26 MED ORDER — TRAMADOL HCL 50 MG PO TABS
50.0000 mg | ORAL_TABLET | Freq: Four times a day (QID) | ORAL | Status: DC | PRN
  Administered 2019-05-27 – 2019-05-30 (×5): 50 mg via ORAL
  Filled 2019-05-26 (×5): qty 1

## 2019-05-26 MED ORDER — PEG 3350-KCL-NA BICARB-NACL 420 G PO SOLR
4000.0000 mL | Freq: Once | ORAL | Status: AC
  Administered 2019-05-27: 4000 mL via ORAL
  Filled 2019-05-26 (×2): qty 4000

## 2019-05-26 MED ORDER — POLYVINYL ALCOHOL 1.4 % OP SOLN
2.0000 [drp] | Freq: Every day | OPHTHALMIC | Status: DC | PRN
  Filled 2019-05-26: qty 15

## 2019-05-26 MED ORDER — TOPIRAMATE 25 MG PO TABS
50.0000 mg | ORAL_TABLET | Freq: Two times a day (BID) | ORAL | Status: DC
  Administered 2019-05-27 – 2019-05-30 (×8): 50 mg via ORAL
  Filled 2019-05-26 (×9): qty 2

## 2019-05-26 MED ORDER — IOHEXOL 350 MG/ML SOLN
100.0000 mL | Freq: Once | INTRAVENOUS | Status: AC | PRN
  Administered 2019-05-26: 100 mL via INTRAVENOUS

## 2019-05-26 MED ORDER — TECHNETIUM TC 99M-LABELED RED BLOOD CELLS IV KIT
20.0000 | PACK | Freq: Once | INTRAVENOUS | Status: AC | PRN
  Administered 2019-05-26: 21.2 via INTRAVENOUS

## 2019-05-26 MED ORDER — DIPHENHYDRAMINE HCL 50 MG/ML IJ SOLN
25.0000 mg | Freq: Once | INTRAMUSCULAR | Status: AC
  Administered 2019-05-26: 25 mg via INTRAVENOUS
  Filled 2019-05-26: qty 1

## 2019-05-26 MED ORDER — ONDANSETRON HCL 4 MG PO TABS: 4.0000 mg | ORAL_TABLET | Freq: Four times a day (QID) | ORAL | Status: DC | PRN

## 2019-05-26 MED ORDER — HYDROMORPHONE HCL 1 MG/ML IJ SOLN
1.0000 mg | Freq: Once | INTRAMUSCULAR | Status: AC
  Administered 2019-05-26: 1 mg via INTRAVENOUS
  Filled 2019-05-26: qty 1

## 2019-05-26 MED ORDER — TIZANIDINE HCL 4 MG PO TABS
4.0000 mg | ORAL_TABLET | Freq: Three times a day (TID) | ORAL | Status: DC | PRN
  Administered 2019-05-27 – 2019-05-29 (×2): 4 mg via ORAL
  Filled 2019-05-26 (×3): qty 1

## 2019-05-26 MED ORDER — ALBUTEROL SULFATE (2.5 MG/3ML) 0.083% IN NEBU: 2.5000 mg | INHALATION_SOLUTION | Freq: Four times a day (QID) | RESPIRATORY_TRACT | Status: DC | PRN

## 2019-05-26 MED ORDER — ACETAMINOPHEN 325 MG PO TABS
650.0000 mg | ORAL_TABLET | Freq: Four times a day (QID) | ORAL | Status: DC | PRN
  Administered 2019-05-27: 650 mg via ORAL
  Filled 2019-05-26: qty 2

## 2019-05-26 MED ORDER — PREDNISOLONE ACETATE 1 % OP SUSP
1.0000 [drp] | OPHTHALMIC | Status: DC | PRN
  Filled 2019-05-26: qty 1

## 2019-05-26 MED ORDER — DIPHENHYDRAMINE HCL 25 MG PO CAPS: 25.0000 mg | ORAL_CAPSULE | Freq: Two times a day (BID) | ORAL | Status: DC | PRN

## 2019-05-26 MED ORDER — MELATONIN 5 MG PO TABS
2.5000 mg | ORAL_TABLET | Freq: Every evening | ORAL | Status: DC | PRN
  Administered 2019-05-28: 2.5 mg via ORAL
  Filled 2019-05-26 (×2): qty 0.5

## 2019-05-26 MED ORDER — ACETAMINOPHEN 650 MG RE SUPP: 650.0000 mg | Freq: Four times a day (QID) | RECTAL | Status: DC | PRN

## 2019-05-26 MED ORDER — METOCLOPRAMIDE HCL 5 MG/ML IJ SOLN
10.0000 mg | Freq: Once | INTRAMUSCULAR | Status: AC
  Administered 2019-05-26: 10 mg via INTRAVENOUS
  Filled 2019-05-26: qty 2

## 2019-05-26 MED ORDER — SODIUM CHLORIDE 0.9 % IV SOLN
INTRAVENOUS | Status: DC
  Administered 2019-05-27 – 2019-05-30 (×7): via INTRAVENOUS

## 2019-05-26 MED ORDER — POLYETHYLENE GLYCOL 3350 17 G PO PACK
17.0000 g | PACK | Freq: Every day | ORAL | Status: DC | PRN
  Administered 2019-05-29: 17 g via ORAL
  Filled 2019-05-26: qty 1

## 2019-05-26 MED ORDER — LEVOTHYROXINE SODIUM 50 MCG PO TABS
150.0000 ug | ORAL_TABLET | Freq: Every day | ORAL | Status: DC
  Administered 2019-05-27 – 2019-05-30 (×3): 150 ug via ORAL
  Filled 2019-05-26 (×3): qty 1

## 2019-05-26 NOTE — Consult Note (Signed)
[name], MD [address], [place], [place], [place], [address] [address], [place], [place], [place], [address] Phone: [phone]  Fax: [phone]  Consultation  Referring Provider:     Dr. Ellender Hose Primary Care Physician:  Sharyne Peach, MD Reason for Consultation:     Hematochezia  Date of Admission:  05/26/2019 Date of Consultation:  05/26/2019         HPI:   Angela [name] is a 43 y.o. female who reportedly had an EGD done 2 days ago in [place] (report not available), with history of PE, on Coumadin, presents with hematochezia for the last 1 day.  She has not taken her Coumadin in 2 to 3 days and her INR is normal. States biopsies were done during the EGD and the procedure was done in preparation for bariatric surgery.  The patient denies abdominal or flank pain, anorexia, nausea or vomiting, dysphagia, change in bowel habits or black  stools or weight loss.   Past Medical History:  Diagnosis Date   Anemia    Anxiety    Asthma    Depression    Fibromyalgia    GERD (gastroesophageal reflux disease)    Hypertension    Irritable bowel syndrome (IBS)    Pseudotumor cerebri    Pulmonary embolism ([place])    Raynaud's disease    Thyroid disease    hypothyroid   Vision disturbance     Past Surgical History:  Procedure Laterality Date   ABDOMINAL HYSTERECTOMY  02/06/13   BREAST BIOPSY Right October, 2014   right breast core biopsy, fibroadenomatous changes   BREAST BIOPSY Right December 2014   FNA retroareolar nodule consistent with fibroadenoma.   BREAST BIOPSY Right 12/16/2016   Fibroadenoma in the retroareolar area at 6:00.   CESAREAN SECTION  1993, 1996, 1997   COLONOSCOPY  2015   Dr Allen Norris   RHINOPLASTY     TONSILECTOMY, ADENOIDECTOMY, BILATERAL MYRINGOTOMY AND TUBES  2004   UPPER GASTROINTESTINAL ENDOSCOPY  2015   Dr Allen Norris    Prior to Admission medications   Medication Sig Start Date End Date Taking? Authorizing  Provider  acetaminophen (TYLENOL) 500 MG tablet Take 1,000-1,500 mg by mouth every 6 (six) hours as needed for moderate pain or headache.    [provider]  acetaZOLAMIDE (DIAMOX) 250 MG tablet Take 750 mg by mouth 2 (two) times daily.     [provider]  albuterol (PROVENTIL HFA;VENTOLIN HFA) 108 (90 BASE) MCG/ACT inhaler Inhale 2 puffs into the lungs every 6 (six) hours as needed for wheezing.    [provider]  buPROPion (WELLBUTRIN SR) 150 MG 12 hr tablet Take 150 mg by mouth 2 (two) times daily.     [provider]  butalbital-acetaminophen-caffeine (FIORICET) 50-325-40 MG tablet Take 1 tablet by mouth daily as needed for headache or migraine.  11/17/16   [provider]  Carboxymethylcellulose Sodium (REFRESH LIQUIGEL OP) Place 1 drop into both eyes daily as needed (dry eyes).    [provider]  clindamycin (CLEOCIN T) 1 % lotion Apply 1 application topically 2 (two) times daily as needed (acne).    [provider]  cyclobenzaprine (FLEXERIL) 10 MG tablet Take 1 tablet (10 mg total) by mouth as needed. Patient taking differently: Take 10 mg by mouth 3 (three) times daily as needed for muscle spasms.  04/10/19   Lucilla Lame, MD  Difluprednate (DUREZOL) 0.05 % EMUL Place 1 drop into both eyes every 6 (six)  hours as needed (uveitis).     [provider]  diphenhydrAMINE (BENADRYL) 25 mg capsule Take 25 mg by mouth 2 (two) times daily as needed for allergies or sleep.     [provider]  EPINEPHrine (EPIPEN 2-PAK) 0.3 mg/0.3 mL IJ SOAJ injection Inject 0.3 mg into the muscle as needed for anaphylaxis.     [provider]  fluticasone (FLONASE) 50 MCG/ACT nasal spray Place 1 spray into both nostrils daily as needed for rhinitis.    [provider]  Galcanezumab-gnlm 120 MG/ML SOAJ Inject 120 mg into the skin every 28 (twenty-eight) days.  01/30/19   [provider]  levothyroxine (SYNTHROID)  150 MCG tablet Take 150 mcg by mouth daily before breakfast.     [provider]  linaclotide (LINZESS) 72 MCG capsule Take 72 mcg by mouth daily as needed (constipation).    [provider]  Melatonin 3 MG CAPS Take 3 mg by mouth at bedtime as needed (sleep).    [provider]  Multiple Vitamin (MULTIVITAMIN WITH MINERALS) TABS tablet Take 1 tablet by mouth daily.    [provider]  omeprazole (PRILOSEC) 40 MG capsule Take 40 mg by mouth every other day.    [provider]  ondansetron (ZOFRAN) 8 MG tablet Take 8 mg by mouth every 8 (eight) hours as needed for nausea or vomiting.    [provider]  oxybutynin (DITROPAN) 5 MG tablet Take 5 mg by mouth at bedtime.    [provider]  prednisoLONE acetate (PRED FORTE) 1 % ophthalmic suspension Place 1 drop into both eyes every 4 (four) hours as needed (uveitis).     [provider]  promethazine (PHENERGAN) 25 MG tablet Take 25 mg by mouth every 6 (six) hours as needed for nausea or vomiting.    [provider]  rosuvastatin (CRESTOR) 20 MG tablet Take 20 mg by mouth daily.    [provider]  sodium fluoride (DENTAGEL) 1.1 % GEL dental gel Place 1 application onto teeth at bedtime.     [provider]  tiZANidine (ZANAFLEX) 4 MG tablet Take 4 mg by mouth every 8 (eight) hours as needed for muscle spasms.    [provider]  topiramate (TOPAMAX) 50 MG tablet Take 50 mg by mouth 2 (two) times daily.    [provider]  warfarin (COUMADIN) 5 MG tablet Take 5 mg every day except 7.5 mg on Tuesdays. Patient taking differently: Take 5 mg by mouth See admin instructions. Take 5 mg in the evening on Sun, Mon, Wed, Fri, and Sat 01/31/19   Lequita Asal, MD  warfarin (COUMADIN) 7.5 MG tablet Take 1 tablet (7.5 mg total) by mouth daily. Patient taking differently: Take 7.5 mg by mouth See admin instructions. Take 7.5 mg in the evening on  Tues and Thurs 12/23/18 12/23/19  Epifanio Lesches, MD    Family History  Problem Relation Age of Onset   Depression Mother    Migraines Mother    Diverticulitis Mother    Hypertension Mother    Heart disease Father    Diabetes Father    Hypertension Father    Stroke Sister    Cancer Maternal Aunt    Cancer Maternal Grandmother      Social History   Tobacco Use   Smoking status: Never Smoker   Smokeless tobacco: Never Used  Substance Use Topics   Alcohol use: Yes    Comment: Occasionally Social Drinker  Drug use: No    Allergies as of 05/26/2019 - Review Complete 05/26/2019  Allergen Reaction Noted   Amoxicillin Hives 05/29/2013   [name]  05/25/2019   Lisinopril Cough 11/26/2016   Nortriptyline  12/20/2018   Orange fruit [citrus]  05/25/2019   Other  05/29/2013   Penicillins Hives 05/29/2013    Review of Systems:    All systems reviewed and negative except where noted in HPI.   Physical Exam:  Vital signs in last 24 hours: Vitals:   05/26/19 1030 05/26/19 1031 05/26/19 1300  BP: (!) 152/91  (!) 143/102  Pulse: 89  78  Resp: 18  20  Temp: 98.3 F (36.8 C)  99.2 F (37.3 C)  TempSrc: Oral  Oral  SpO2: 100%  100%  Weight:  136 kg   Height:  5\' 3"  (1.6 m)      General:   Pleasant, cooperative in NAD Head:  Normocephalic and atraumatic. Eyes:   No icterus.   Conjunctiva pink. PERRLA. Ears:  Normal auditory acuity. Neck:  Supple; no masses or thyroidomegaly Lungs: Respirations even and unlabored. Lungs clear to auscultation bilaterally.   No wheezes, crackles, or rhonchi.  Abdomen:  Soft, nondistended, nontender. Normal bowel sounds. No appreciable masses or hepatomegaly.  No rebound or guarding.  Neurologic:  Alert and oriented x3;  grossly normal neurologically. Skin:  Intact without significant lesions or rashes. Cervical Nodes:  No significant cervical adenopathy. Psych:  Alert and cooperative. Normal affect.  LAB  RESULTS: Recent Labs    05/26/19 1033  WBC 9.4  HGB 13.2  HCT 39.1  PLT 314   BMET Recent Labs    05/26/19 1033  NA 140  K 4.1  CL 108  CO2 23  GLUCOSE 99  BUN 13  CREATININE 0.90  CALCIUM 9.2   LFT Recent Labs    05/26/19 1033  PROT 7.2  ALBUMIN 3.9  AST 13*  ALT 13  ALKPHOS 44  BILITOT 0.7   PT/INR Recent Labs    05/26/19 1033  LABPROT 13.1  INR 1.0    STUDIES: Ct Angio Abd/pel W/ And/or W/o  Result Date: 05/26/2019 CLINICAL DATA:  Bright red rectal bleeding since last evening. Recent EGD. History of pulmonary embolism, currently on Coumadin. EXAM: CTA ABDOMEN AND PELVIS WITHOUT AND WITH CONTRAST TECHNIQUE: Multidetector CT imaging of the abdomen and pelvis was performed using the standard protocol during bolus administration of intravenous contrast. Multiplanar reconstructed images and MIPs were obtained and reviewed to evaluate the vascular anatomy. CONTRAST:  144mL OMNIPAQUE IOHEXOL 350 MG/ML SOLN COMPARISON:  CT abdomen pelvis-01/16/2019 FINDINGS: VASCULAR Aorta: There is no significant atherosclerotic plaque within the normal caliber abdominal aorta. No evidence of abdominal aortic dissection or periaortic stranding. Celiac: Widely patent without a hemodynamically significant narrowing. Conventional branching pattern though note is made of a separate origin of the left hepatic artery in lieu of a proper hepatic artery. SMA: Widely patent without a hemodynamically significant narrowing. Conventional branching pattern. The distal tributaries the SMA appear widely patent without discrete intraluminal filling defect to suggest distal embolism. Renals: Solitary bilaterally; both renal arteries are widely patent without hemodynamically significant narrowing. No discrete areas of vessel irregularity to suggest FMD. IMA: Widely patent without a hemodynamically significant narrowing. Inflow: The bilateral common, external and internal iliac arteries are of normal caliber  and widely patent without a hemodynamically significant narrowing. Proximal Outflow: The bilateral common and imaged portions of the bilateral deep and superficial femoral arteries are widely patent without a  hemodynamically significant narrowing. Veins: The IVC and pelvic venous systems are widely patent. Review of the MIP images confirms the above findings. _________________________________________________________ NON-VASCULAR Lower chest: Limited visualization of the lower thorax demonstrates improved aeration of the right costophrenic angle with minimal residual linear heterogeneous opacities favored to represent atelectasis or scarring. Unchanged subsegmental atelectasis within the contralateral left costophrenic angle and medial segment of the right middle lobe. No new focal airspace opacities. No pleural effusion. Normal heart size.  No pericardial effusion. Hepatobiliary: Normal hepatic contour. Suspected perfusion allow abnormality involving the central aspect of the lateral segment of the left lobe of the liver without associated abnormality on the acquired portal venous phase imaging. No discrete hepatic lesions. Normal appearance of the gallbladder given degree distention. No radiopaque gallstones. No intra extrahepatic biliary duct dilatation. No ascites. Pancreas: Normal appearance of the pancreas. Spleen: Normal appearance of the spleen. Adrenals/Urinary Tract: There is symmetric enhancement of the bilateral kidneys. No definite renal stones this postcontrast examination. No discrete renal lesions. No urine obstruction or perinephric stranding. Normal appearance the bilateral adrenal glands. Normal appearance of the urinary bladder given degree distention. Stomach/Bowel: Large colonic stool burden without evidence of enteric obstruction. Scattered colonic diverticulosis without evidence of superimposed acute diverticulitis. Normal appearance of the terminal ileum and the retrocecal appendix. No  discrete areas of bowel wall thickening. Potential small hiatal hernia. No pneumoperitoneum, pneumatosis or portal venous gas. There are no discrete areas of intraluminal contrast extravasation to suggest the etiology of reported history of GI bleeding. Lymphatic: No bulky retroperitoneal, mesenteric, pelvic or inguinal lymphadenopathy. Reproductive: Post hysterectomy. No discrete adnexal lesion. No free fluid within the pelvic cul-de-sac. Other: Tiny mesenteric fat containing peri umbilical hernia. There is a minimal amount of subcutaneous edema about the midline of the low back. Ill-defined areas subcutaneous stranding within the undersurface of the abdominal pannus may represent the sequela of subcutaneous medication administration. Musculoskeletal: No acute or aggressive osseous abnormalities. Mild-to-moderate multilevel lumbar spine DDD, worse at L3-L4 with disc space height loss, endplate irregularity and sclerosis. IMPRESSION: Vascular Impression: 1. No discrete areas of intraluminal contrast extravasation to suggest etiology reported history of GI bleeding. 2. Unremarkable CTA of the abdomen pelvis. Nonvascular Impression: 1. Scattered colonic diverticulosis without evidence superimposed acute diverticulitis. 2. Large colonic stool burden without evidence of enteric obstruction. 3. Improved aeration of the right costophrenic angle with minimal residual atelectasis/scar. Electronically Signed   By: Sandi Mariscal M.D.   On: 05/26/2019 14:49      Impression / Plan:   Angela [name] is a 43 y.o. y/o female with hematochezia 2 days post EGD with biopsies in [place], [place] (report not available), with hx of PE on Coumadin   With normal Hemoglobin source of hematochezia is likely lower GI bleed RBC scan pending to localize source, since pt had active bleeding in ER If positive, consult vascular surgery for embolization  If negative will proceed with EGD (to rule out bleeding from biopsy sites, although  less likely), and colonoscopy once COVID testing is negative  I have discussed alternative options, risks & benefits,  which include, but are not limited to, bleeding, infection, perforation,respiratory complication & drug reaction.  The patient agrees with this plan & written consent will be obtained.    PPI IV twice daily  Continue serial CBCs and transfuse PRN Avoid NSAIDs Maintain 2 large-bore IV lines Please page GI with any acute hemodynamic changes, or signs of active GI bleeding   Thank you for involving me in  the care of this patient.      LOS: 0 days   Virgel Manifold, MD  05/26/2019, 3:31 PM

## 2019-05-26 NOTE — H&P (Signed)
[facility] [place] NAME: Angela [name]    MR#:  [id number]  DATE OF BIRTH:  07-08-1976  DATE OF ADMISSION:  05/26/2019  PRIMARY CARE PHYSICIAN: Sharyne Peach, MD   REQUESTING/REFERRING PHYSICIAN: Duffy Bruce, MD  CHIEF COMPLAINT:   Chief Complaint  Patient presents with   Rectal Bleeding    HISTORY OF PRESENT ILLNESS:  Angela [name]  is a 43 y.o. female with a known history of hypertension, IBS, history PE on Coumadin, depression, anxiety, asthma, hypothyroidism, pseudotumor cerebri who presented to the ED with bloody stool. Patient just had an EGD on 05/24/19 for a preop evaluation for bariatric surgery. A single gastric biopsy was performed during the endoscopy.  Patient had no immediate complications.  Yesterday, she had 2 bloody bowel movements.  She states there is blood in the toilet and on the toilet paper.  She also endorses some abdominal pain across her entire upper abdomen, that radiates down both sides.  The pain is sharp.  She denies any fevers or chills.  No nausea or vomiting.  She endorses both constipation and diarrhea due to her history of IBS.  In the ED, she was mildly hypertensive.  Labs were unremarkable.  CTA abdomen/pelvis showed scattered diverticulosis, large stool burden.  Bleeding scan was ordered and was pending.  Hospitalists were called for admission.  PAST MEDICAL HISTORY:   Past Medical History:  Diagnosis Date   Anemia    Anxiety    Asthma    Depression    Fibromyalgia    GERD (gastroesophageal reflux disease)    Hypertension    Irritable bowel syndrome (IBS)    Pseudotumor cerebri    Pulmonary embolism (HCC)    Raynaud's disease    Thyroid disease    hypothyroid   Vision disturbance     PAST SURGICAL HISTORY:   Past Surgical History:  Procedure Laterality Date   ABDOMINAL HYSTERECTOMY  02/06/13   BREAST BIOPSY Right October, 2014   right breast core biopsy,  fibroadenomatous changes   BREAST BIOPSY Right December 2014   FNA retroareolar nodule consistent with fibroadenoma.   BREAST BIOPSY Right 12/16/2016   Fibroadenoma in the retroareolar area at 6:00.   CESAREAN SECTION  1993, 1996, 1997   COLONOSCOPY  2015   Dr Allen Norris   RHINOPLASTY     TONSILECTOMY, [place]  2004   UPPER GASTROINTESTINAL ENDOSCOPY  2015   Dr Allen Norris    SOCIAL HISTORY:   Social History   Tobacco Use   Smoking status: Never Smoker   Smokeless tobacco: Never Used  Substance Use Topics   Alcohol use: Yes    Comment: Occasionally Social Drinker    FAMILY HISTORY:   Family History  Problem Relation Age of Onset   Depression Mother    Migraines Mother    Diverticulitis Mother    Hypertension Mother    Heart disease Father    Diabetes Father    Hypertension Father    Stroke Sister    Cancer Maternal Aunt    Cancer Maternal Grandmother     DRUG ALLERGIES:   Allergies  Allergen Reactions   Amoxicillin Hives    Did it involve swelling of the face/tongue/throat, SOB, or low BP? No Did it involve sudden or severe rash/hives, skin peeling, or any reaction on the inside of your mouth or nose? No Did you need to seek medical attention at a hospital or doctor's office? Unknown  When did it last happen?5+ years If all above answers are NO, may proceed with cephalosporin use.    [name]     Break out in mouth   Lisinopril Cough   Nortriptyline     Unknown reaction    Orange Fruit [Citrus]     Break out in mouth   Other     Sulfa eye drops- Scratched corneas   Penicillins Hives    Did it involve swelling of the face/tongue/throat, SOB, or low BP? No Did it involve sudden or severe rash/hives, skin peeling, or any reaction on the inside of your mouth or nose? No Did you need to seek medical attention at a hospital or doctor's office? Unknown When did it last happen?5+ years If all  above answers are NO, may proceed with cephalosporin use.     REVIEW OF SYSTEMS:   Review of Systems  Constitutional: Negative for chills and fever.  HENT: Negative for congestion and sore throat.   Eyes: Negative for blurred vision and double vision.  Respiratory: Negative for cough and shortness of breath.   Cardiovascular: Negative for chest pain and palpitations.  Gastrointestinal: Positive for abdominal pain, blood in stool, constipation and diarrhea. Negative for melena, nausea and vomiting.  Genitourinary: Negative for dysuria and urgency.  Musculoskeletal: Negative for back pain and neck pain.  Neurological: Negative for dizziness and headaches.  Psychiatric/Behavioral: Negative for depression. The patient is not nervous/anxious.     MEDICATIONS AT HOME:   Prior to Admission medications   Medication Sig Start Date End Date Taking? Authorizing Provider  acetaminophen (TYLENOL) 500 MG tablet Take 1,000-1,500 mg by mouth every 6 (six) hours as needed for moderate pain or headache.    [provider]  acetaZOLAMIDE (DIAMOX) 250 MG tablet Take 750 mg by mouth 2 (two) times daily.     [provider]  albuterol (PROVENTIL HFA;VENTOLIN HFA) 108 (90 BASE) MCG/ACT inhaler Inhale 2 puffs into the lungs every 6 (six) hours as needed for wheezing.    [provider]  buPROPion (WELLBUTRIN SR) 150 MG 12 hr tablet Take 150 mg by mouth 2 (two) times daily.     [provider]  butalbital-acetaminophen-caffeine (FIORICET) 50-325-40 MG tablet Take 1 tablet by mouth daily as needed for headache or migraine.  11/17/16   [provider]  Carboxymethylcellulose Sodium (REFRESH LIQUIGEL OP) Place 1 drop into both eyes daily as needed (dry eyes).    [provider]  clindamycin (CLEOCIN T) 1 % lotion Apply 1 application topically 2 (two) times daily as needed (acne).    [provider]  cyclobenzaprine (FLEXERIL) 10 MG tablet Take 1  tablet (10 mg total) by mouth as needed. Patient taking differently: Take 10 mg by mouth 3 (three) times daily as needed for muscle spasms.  04/10/19   Lucilla Lame, MD  Difluprednate (DUREZOL) 0.05 % EMUL Place 1 drop into both eyes every 6 (six) hours as needed (uveitis).     [provider]  diphenhydrAMINE (BENADRYL) 25 mg capsule Take 25 mg by mouth 2 (two) times daily as needed for allergies or sleep.     [provider]  EPINEPHrine (EPIPEN 2-PAK) 0.3 mg/0.3 mL IJ SOAJ injection Inject 0.3 mg into the muscle as needed for anaphylaxis.     [provider]  fluticasone (FLONASE) 50 MCG/ACT nasal spray Place 1 spray into both nostrils daily as needed for rhinitis.    [provider]  Galcanezumab-gnlm 120 MG/ML SOAJ Inject 120  mg into the skin every 28 (twenty-eight) days.  01/30/19   [provider]  levothyroxine (SYNTHROID) 150 MCG tablet Take 150 mcg by mouth daily before breakfast.     [provider]  linaclotide (LINZESS) 72 MCG capsule Take 72 mcg by mouth daily as needed (constipation).    [provider]  Melatonin 3 MG CAPS Take 3 mg by mouth at bedtime as needed (sleep).    [provider]  Multiple Vitamin (MULTIVITAMIN WITH MINERALS) TABS tablet Take 1 tablet by mouth daily.    [provider]  omeprazole (PRILOSEC) 40 MG capsule Take 40 mg by mouth every other day.    [provider]  ondansetron (ZOFRAN) 8 MG tablet Take 8 mg by mouth every 8 (eight) hours as needed for nausea or vomiting.    [provider]  oxybutynin (DITROPAN) 5 MG tablet Take 5 mg by mouth at bedtime.    [provider]  prednisoLONE acetate (PRED FORTE) 1 % ophthalmic suspension Place 1 drop into both eyes every 4 (four) hours as needed (uveitis).     [provider]  promethazine (PHENERGAN) 25 MG tablet Take 25 mg by mouth every 6 (six) hours as needed for nausea or vomiting.    [provider]  rosuvastatin (CRESTOR) 20 MG tablet Take 20 mg by mouth daily.    [provider]  sodium fluoride (DENTAGEL) 1.1 % GEL dental gel Place 1 application onto teeth at bedtime.     [provider]  tiZANidine (ZANAFLEX) 4 MG tablet Take 4 mg by mouth every 8 (eight) hours as needed for muscle spasms.    [provider]  topiramate (TOPAMAX) 50 MG tablet Take 50 mg by mouth 2 (two) times daily.    [provider]  warfarin (COUMADIN) 5 MG tablet Take 5 mg every day except 7.5 mg on Tuesdays. Patient taking differently: Take 5 mg by mouth See admin instructions. Take 5 mg in the evening on Sun, Mon, Wed, Fri, and Sat 01/31/19   Lequita Asal, MD  warfarin (COUMADIN) 7.5 MG tablet Take 1 tablet (7.5 mg total) by mouth daily. Patient taking differently: Take 7.5 mg by mouth See admin instructions. Take 7.5 mg in the evening on Tues and Thurs 12/23/18 12/23/19  Epifanio Lesches, MD      VITAL SIGNS:  Blood pressure (!) 143/102, pulse 78, temperature 99.2 F (37.3 C), temperature source Oral, resp. rate 20, height 5\' 3"  (1.6 m), weight 136 kg, SpO2 100 %.  PHYSICAL EXAMINATION:  Physical Exam  GENERAL:  43 y.o.-year-old patient lying in the bed with no acute distress.  EYES: Pupils equal, round, reactive to light and accommodation. No scleral icterus. Extraocular muscles intact.  HEENT: Head atraumatic, normocephalic. Oropharynx and nasopharynx clear.  NECK:  Supple, no jugular venous distention. No thyroid enlargement, no tenderness.  LUNGS: Normal breath sounds bilaterally, no wheezing, rales,rhonchi or crepitation. No use of accessory muscles of respiration.  CARDIOVASCULAR: RRR, S1, S2 normal. No murmurs, rubs, or gallops.  ABDOMEN: Soft, nondistended. Bowel sounds present. No organomegaly or mass. +mild diffuse tenderness to palpation, no rebound or guarding. EXTREMITIES: No pedal edema, cyanosis, or clubbing.  NEUROLOGIC: Cranial  nerves II through XII are intact. Muscle strength 5/5 in all extremities. Sensation intact. Gait not checked.  PSYCHIATRIC: The patient is alert and oriented x 3.  SKIN: No obvious rash, lesion, or ulcer.   LABORATORY PANEL:   CBC Recent Labs  Lab 05/26/19 1033  WBC 9.4  HGB 13.2  HCT 39.1  PLT 314   ------------------------------------------------------------------------------------------------------------------  Chemistries  Recent Labs  Lab 05/26/19 1033  NA 140  K 4.1  CL 108  CO2 23  GLUCOSE 99  BUN 13  CREATININE 0.90  CALCIUM 9.2  AST 13*  ALT 13  ALKPHOS 44  BILITOT 0.7   ------------------------------------------------------------------------------------------------------------------  Cardiac Enzymes No results for input(s): TROPONINI in the last 168 hours. ------------------------------------------------------------------------------------------------------------------  RADIOLOGY:  Ct Angio Abd/pel W/ And/or W/o  Result Date: 05/26/2019 CLINICAL DATA:  Bright red rectal bleeding since last evening. Recent EGD. History of pulmonary embolism, currently on Coumadin. EXAM: CTA ABDOMEN AND PELVIS WITHOUT AND WITH CONTRAST TECHNIQUE: Multidetector CT imaging of the abdomen and pelvis was performed using the standard protocol during bolus administration of intravenous contrast. Multiplanar reconstructed images and MIPs were obtained and reviewed to evaluate the vascular anatomy. CONTRAST:  154mL OMNIPAQUE IOHEXOL 350 MG/ML SOLN COMPARISON:  CT abdomen pelvis-01/16/2019 FINDINGS: VASCULAR Aorta: There is no significant atherosclerotic plaque within the normal caliber abdominal aorta. No evidence of abdominal aortic dissection or periaortic stranding. Celiac: Widely patent without a hemodynamically significant narrowing. Conventional branching pattern though note is made of a separate origin of the left hepatic artery in lieu of a proper hepatic artery. SMA: Widely patent  without a hemodynamically significant narrowing. Conventional branching pattern. The distal tributaries the SMA appear widely patent without discrete intraluminal filling defect to suggest distal embolism. Renals: Solitary bilaterally; both renal arteries are widely patent without hemodynamically significant narrowing. No discrete areas of vessel irregularity to suggest FMD. IMA: Widely patent without a hemodynamically significant narrowing. Inflow: The bilateral common, external and internal iliac arteries are of normal caliber and widely patent without a hemodynamically significant narrowing. Proximal Outflow: The bilateral common and imaged portions of the bilateral deep and superficial femoral arteries are widely patent without a hemodynamically significant narrowing. Veins: The IVC and pelvic venous systems are widely patent. Review of the MIP images confirms the above findings. _________________________________________________________ NON-VASCULAR Lower chest: Limited visualization of the lower thorax demonstrates improved aeration of the right costophrenic angle with minimal residual linear heterogeneous opacities favored to represent atelectasis or scarring. Unchanged subsegmental atelectasis within the contralateral left costophrenic angle and medial segment of the right middle lobe. No new focal airspace opacities. No pleural effusion. Normal heart size.  No pericardial effusion. Hepatobiliary: Normal hepatic contour. Suspected perfusion allow abnormality involving the central aspect of the lateral segment of the left lobe of the liver without associated abnormality on the acquired portal venous phase imaging. No discrete hepatic lesions. Normal appearance of the gallbladder given degree distention. No radiopaque gallstones. No intra extrahepatic biliary duct dilatation. No ascites. Pancreas: Normal appearance of the pancreas. Spleen: Normal appearance of the spleen. Adrenals/Urinary Tract: There is  symmetric enhancement of the bilateral kidneys. No definite renal stones this postcontrast examination. No discrete renal lesions. No urine obstruction or perinephric stranding. Normal appearance the bilateral adrenal glands. Normal appearance of the urinary bladder given degree distention. Stomach/Bowel: Large colonic stool burden without evidence of enteric obstruction. Scattered colonic diverticulosis without evidence of superimposed acute diverticulitis. Normal appearance of the terminal ileum and the retrocecal appendix. No discrete areas of bowel wall thickening. Potential small hiatal hernia. No pneumoperitoneum, pneumatosis or portal venous gas. There are no discrete areas of intraluminal contrast extravasation to suggest the etiology of reported history of GI bleeding. Lymphatic: No bulky retroperitoneal, mesenteric, pelvic or inguinal lymphadenopathy. Reproductive: Post hysterectomy. No discrete adnexal lesion. No free  fluid within the pelvic cul-de-sac. Other: Tiny mesenteric fat containing peri umbilical hernia. There is a minimal amount of subcutaneous edema about the midline of the low back. Ill-defined areas subcutaneous stranding within the undersurface of the abdominal pannus may represent the sequela of subcutaneous medication administration. Musculoskeletal: No acute or aggressive osseous abnormalities. Mild-to-moderate multilevel lumbar spine DDD, worse at L3-L4 with disc space height loss, endplate irregularity and sclerosis. IMPRESSION: Vascular Impression: 1. No discrete areas of intraluminal contrast extravasation to suggest etiology reported history of GI bleeding. 2. Unremarkable CTA of the abdomen pelvis. Nonvascular Impression: 1. Scattered colonic diverticulosis without evidence superimposed acute diverticulitis. 2. Large colonic stool burden without evidence of enteric obstruction. 3. Improved aeration of the right costophrenic angle with minimal residual atelectasis/scar.  Electronically Signed   By: Sandi Mariscal M.D.   On: 05/26/2019 14:49      IMPRESSION AND PLAN:   GI bleed- possibly diverticular bleed.  Patient is on Coumadin for history of PE. Hemoglobin is normal. No NSAID use. -Recent EGD 10/14 (pre-op evaluation prior to bariatric surgery)  -CTA abdomen/pelvis with scattered diverticulosis -Bleeding scan is pending -GI has been consulted- if scan is negative, will do EGD and colonoscopy when COVID test is negative -Continue IV Protonix twice daily  History of PE x 2- INR 1.0 in the ED. -Patient is supposed to have an IVC filter placed with Dr. Delana Meyer on 10/21- Dr. Delana Meyer will be on call on Monday- can ask him if he wants to do the IVC filter while she is here -Holding home Coumadin  Constipation- CTA abdomen/pelvis showed a large stool burden. -Continue home Linzess -Possible colonoscopy tomorrow, so patient may be started on bowel prep by GI. If not, will start stool softeners.  Hyperlipidemia- stable -Continue home Crestor  Hypothyroidism- stable -Continue home Synthroid  History of pseudotumor cerebri- stable, no current headaches. -Holding home acetazolamide due to stomach upset -Continue home topimax   Depression/anxiety- stable -Continue home Wellbutrin  All the records are reviewed and case discussed with ED provider. Management plans discussed with the patient, family and they are in agreement.  CODE STATUS: Full  TOTAL TIME TAKING CARE OF THIS PATIENT: 45 minutes.    Berna Spare Trai Ells M.D on 05/26/2019 at 4:39 PM  Between 7am to 6pm - Pager - [phone]  After 6pm go to [email or web address] - Proofreader  [facility]  Office  [phone]  CC: Primary care physician; Sharyne Peach, MD   Note: This dictation was prepared with Dragon dictation along with smaller phrase technology. Any transcriptional errors that result from this process are unintentional.

## 2019-05-26 NOTE — ED Notes (Signed)
Pt had EGD on Wednesday, pt states yesterday she started passing blood from her rectum. Pt is on Coumadin. Pt reports that she was passing bright red blood yesterday, called her PCP and GI doctor who suggested that she be evaluated. Pt was passed some blood clots as well. Pt reports that bleeding has gotten better today. Blood is now light pink colored. Pt states that she has had to change her underwear 3 times due to bleeding. Pt reports nausea with no vomiting. Pt is also currently c/o headache.

## 2019-05-26 NOTE — ED Triage Notes (Signed)
Reports bright red rectal bleeding last night, reports scant pink when wiping today. Pt recently had EGD and when she contacted her GI doctor she was told to come here. Pt reports that she has hx of PE and is on coumadin, had INR today and it was a 1 per patient. Pt alert and oriented X4, cooperative, RR even and unlabored, color WNL. Pt in NAD.

## 2019-05-26 NOTE — Progress Notes (Signed)
Dr Jannifer Franklin notified pt's complaints of HA, orders put in

## 2019-05-26 NOTE — ED Provider Notes (Signed)
[facility] Emergency Department Provider Note  ____________________________________________   First MD Initiated Contact with Patient 05/26/19 1345     (approximate)  I have reviewed the triage vital signs and the nursing notes.   HISTORY  Chief Complaint Rectal Bleeding    HPI Angela [name] is a 43 y.o. female  With PMHx obesity, PE on Coumadin here with rectal bleeding. Pt recently underwent EGD for gastric surgery work-up/screening, had a biopsy. Since then, she had some mild abd discomfort but approx 24 hours later, began having bright red blood per rectum. She has been passing small clots and liquid blood since then in her stools. She's ahd some mild diffuse abd cramping with it. Denies h/o GIB or hemorrhoids. No diarrhea. No nausea, vomiting, or hematemesis. She has stopped her coumadin temporarily. No other issues/complaitns. Scheduled for IVC filter placement next week. Abd pain is mild, cramping, intermittent and not persistent. No vaginal bleeding. No SOB or CP        Past Medical History:  Diagnosis Date   Anemia    Anxiety    Asthma    Depression    Fibromyalgia    GERD (gastroesophageal reflux disease)    Hypertension    Irritable bowel syndrome (IBS)    Pseudotumor cerebri    Pulmonary embolism ([place])    Raynaud's disease    Thyroid disease    hypothyroid   Vision disturbance     Patient Active Problem List   Diagnosis Date Noted   Chronic venous insufficiency 05/22/2019   Lymphedema 05/22/2019   Uveitis 05/22/2019   Cardiomegaly 03/12/2019   Right lower lobe pulmonary nodule 01/30/2019   Weight gain 01/30/2019   Pleural nodule 01/19/2019   Long term current use of anticoagulant therapy 01/06/2019   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[place]) 12/26/2018   Pulmonary embolism with infarction ([place]) 12/20/2018   Galactorrhea (CODE) 05/31/2018   Fatigue 01/25/2018   Hyperlipidemia 01/25/2018    Hyperprolactinemia ([place]) 01/25/2018   Hypothyroidism 01/25/2018   Irritable bowel syndrome without diarrhea 01/25/2018   Obesity, morbid ([place]) 01/25/2018   Raynaud's disease 01/25/2018   Nipple discharge in female 11/27/2016   Asthma, mild 02/20/2016   Herpes simplex infection of genitourinary system 12/20/2015   Morbid obesity with body mass index of 45.0-49.9 in adult [facility]) 12/20/2015   Non-seasonal allergic rhinitis due to pollen 12/20/2015   Other shoulder lesions, unspecified shoulder 07/22/2015   Rotator cuff tendinitis 07/22/2015   Clinical depression 06/18/2015   Headache, migraine 06/18/2015   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   Calculus of kidney 02/20/2015   Chronic sinusitis 02/20/2015   Ringing in ear 02/20/2015   Stuffy nose 02/20/2015   Rheumatoid arthritis of knee ([place]) 10/09/2014   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[place]) 10/09/2014   Mass of breast, right 05/24/2014   Lump or mass in breast 05/29/2013   Breast lump 05/29/2013   Essential (primary) hypertension 04/26/2013   Cephalalgia 04/26/2013   Fibroid 04/26/2013   Muscle ache 04/26/2013   Bacterial infection 04/26/2013   Pain in or around eye 04/26/2013   Urinary tract infection 04/26/2013   Benign intracranial hypertension 12/08/2012   Increased prolactin level 12/08/2012   Anxiety state 06/12/2011    Past Surgical History:  Procedure Laterality Date   ABDOMINAL HYSTERECTOMY  02/06/13   BREAST BIOPSY Right October, 2014   right breast core biopsy, fibroadenomatous changes   BREAST BIOPSY Right December 2014   FNA retroareolar nodule consistent with fibroadenoma.  BREAST BIOPSY Right 12/16/2016   Fibroadenoma in the retroareolar area at 6:00.   CESAREAN SECTION  1993, 1996, 1997   COLONOSCOPY  2015   Dr Allen Norris   RHINOPLASTY     TONSILECTOMY, ADENOIDECTOMY, BILATERAL MYRINGOTOMY AND TUBES  2004   UPPER GASTROINTESTINAL  ENDOSCOPY  2015   Dr Allen Norris    Prior to Admission medications   Medication Sig Start Date End Date Taking? Authorizing Provider  acetaminophen (TYLENOL) 500 MG tablet Take 1,000-1,500 mg by mouth every 6 (six) hours as needed for moderate pain or headache.    [provider]  acetaZOLAMIDE (DIAMOX) 250 MG tablet Take 750 mg by mouth 2 (two) times daily.     [provider]  albuterol (PROVENTIL HFA;VENTOLIN HFA) 108 (90 BASE) MCG/ACT inhaler Inhale 2 puffs into the lungs every 6 (six) hours as needed for wheezing.    [provider]  buPROPion (WELLBUTRIN SR) 150 MG 12 hr tablet Take 150 mg by mouth 2 (two) times daily.     [provider]  butalbital-acetaminophen-caffeine (FIORICET) 50-325-40 MG tablet Take 1 tablet by mouth daily as needed for headache or migraine.  11/17/16   [provider]  Carboxymethylcellulose Sodium (REFRESH LIQUIGEL OP) Place 1 drop into both eyes daily as needed (dry eyes).    [provider]  clindamycin (CLEOCIN T) 1 % lotion Apply 1 application topically 2 (two) times daily as needed (acne).    [provider]  cyclobenzaprine (FLEXERIL) 10 MG tablet Take 1 tablet (10 mg total) by mouth as needed. Patient taking differently: Take 10 mg by mouth 3 (three) times daily as needed for muscle spasms.  04/10/19   Lucilla Lame, MD  Difluprednate (DUREZOL) 0.05 % EMUL Place 1 drop into both eyes every 6 (six) hours as needed (uveitis).     [provider]  diphenhydrAMINE (BENADRYL) 25 mg capsule Take 25 mg by mouth 2 (two) times daily as needed for allergies or sleep.     [provider]  EPINEPHrine (EPIPEN 2-PAK) 0.3 mg/0.3 mL IJ SOAJ injection Inject 0.3 mg into the muscle as needed for anaphylaxis.     [provider]  fluticasone (FLONASE) 50 MCG/ACT nasal spray Place 1 spray into both nostrils daily as needed for rhinitis.    [provider]  Galcanezumab-gnlm 120 MG/ML SOAJ  Inject 120 mg into the skin every 28 (twenty-eight) days.  01/30/19   [provider]  levothyroxine (SYNTHROID) 150 MCG tablet Take 150 mcg by mouth daily before breakfast.     [provider]  linaclotide (LINZESS) 72 MCG capsule Take 72 mcg by mouth daily as needed (constipation).    [provider]  Melatonin 3 MG CAPS Take 3 mg by mouth at bedtime as needed (sleep).    [provider]  Multiple Vitamin (MULTIVITAMIN WITH MINERALS) TABS tablet Take 1 tablet by mouth daily.    [provider]  omeprazole (PRILOSEC) 40 MG capsule Take 40 mg by mouth every other day.    [provider]  ondansetron (ZOFRAN) 8 MG tablet Take 8 mg by mouth every 8 (eight) hours as needed for nausea or vomiting.    [provider]  oxybutynin (DITROPAN) 5 MG tablet Take 5 mg by mouth at bedtime.    [provider]  prednisoLONE acetate (PRED FORTE) 1 % ophthalmic suspension Place 1 drop into both eyes every 4 (four) hours as needed (uveitis).     [provider]  promethazine (  PHENERGAN) 25 MG tablet Take 25 mg by mouth every 6 (six) hours as needed for nausea or vomiting.    [provider]  rosuvastatin (CRESTOR) 20 MG tablet Take 20 mg by mouth daily.    [provider]  sodium fluoride (DENTAGEL) 1.1 % GEL dental gel Place 1 application onto teeth at bedtime.     [provider]  tiZANidine (ZANAFLEX) 4 MG tablet Take 4 mg by mouth every 8 (eight) hours as needed for muscle spasms.    [provider]  topiramate (TOPAMAX) 50 MG tablet Take 50 mg by mouth 2 (two) times daily.    [provider]  warfarin (COUMADIN) 5 MG tablet Take 5 mg every day except 7.5 mg on Tuesdays. Patient taking differently: Take 5 mg by mouth See admin instructions. Take 5 mg in the evening on Sun, Mon, Wed, Fri, and Sat 01/31/19   Lequita Asal, MD  warfarin (COUMADIN) 7.5 MG tablet Take 1 tablet (7.5 mg total)  by mouth daily. Patient taking differently: Take 7.5 mg by mouth See admin instructions. Take 7.5 mg in the evening on Tues and Thurs 12/23/18 12/23/19  Epifanio Lesches, MD    Allergies Amoxicillin, [name], Lisinopril, Nortriptyline, Orange fruit [citrus], Other, and Penicillins  Family History  Problem Relation Age of Onset   Depression Mother    Migraines Mother    Diverticulitis Mother    Hypertension Mother    Heart disease Father    Diabetes Father    Hypertension Father    Stroke Sister    Cancer Maternal Aunt    Cancer Maternal Grandmother     Social History Social History   Tobacco Use   Smoking status: Never Smoker   Smokeless tobacco: Never Used  Substance Use Topics   Alcohol use: Yes    Comment: Occasionally Social Drinker   Drug use: No    Review of Systems  Review of Systems  Constitutional: Positive for fatigue. Negative for chills and fever.  HENT: Negative for congestion and sore throat.   Eyes: Negative for visual disturbance.  Respiratory: Negative for cough and shortness of breath.   Cardiovascular: Negative for chest pain.  Gastrointestinal: Positive for abdominal pain, anal bleeding and nausea. Negative for diarrhea and vomiting.  Genitourinary: Negative for flank pain.  Musculoskeletal: Negative for back pain and neck pain.  Skin: Negative for rash and wound.  Allergic/Immunologic: Negative for immunocompromised state.  Neurological: Negative for weakness and numbness.  Hematological: Bruises/bleeds easily.  All other systems reviewed and are negative.    ____________________________________________  PHYSICAL EXAM:      VITAL SIGNS: ED Triage Vitals  Enc Vitals Group     BP 05/26/19 1030 (!) 152/91     Pulse Rate 05/26/19 1030 89     Resp 05/26/19 1030 18     Temp 05/26/19 1030 98.3 F (36.8 C)     Temp Source 05/26/19 1030 Oral     SpO2 05/26/19 1030 100 %     Weight 05/26/19 1031 299 lb 13.2 oz (136 kg)      Height 05/26/19 1031 5\' 3"  (1.6 m)     Head Circumference --      Peak Flow --      Pain Score 05/26/19 1031 6     Pain Loc --      Pain Edu? --      Excl. in [place]? --      Physical Exam Vitals signs and nursing note reviewed.  Constitutional:      General: She is not in acute distress.    Appearance: She is well-developed.  HENT:     Head: Normocephalic and atraumatic.  Eyes:     Conjunctiva/sclera: Conjunctivae normal.  Neck:     Musculoskeletal: Neck supple.  Cardiovascular:     Rate and Rhythm: Normal rate and regular rhythm.     Heart sounds: Normal heart sounds. No murmur. No friction rub.  Pulmonary:     Effort: Pulmonary effort is normal. No respiratory distress.     Breath sounds: Normal breath sounds. No wheezing or rales.  Abdominal:     General: There is no distension.     Palpations: Abdomen is soft.     Tenderness: There is abdominal tenderness (minimal, diffuse, worse w/ palpation and flexion ).  Genitourinary:    Comments: No internal or external hemorrhoids noted, no active bleeding. Grossly bloody stool and small amount of blood noted on underwear. Skin:    General: Skin is warm.     Capillary Refill: Capillary refill takes less than 2 seconds.  Neurological:     Mental Status: She is alert and oriented to person, place, and time.     Motor: No abnormal muscle tone.       ____________________________________________   LABS (all labs ordered are listed, but only abnormal results are displayed)  Labs Reviewed  COMPREHENSIVE METABOLIC PANEL - Abnormal; Notable for the following components:      Result Value   AST 13 (*)    All other components within normal limits  SARS CORONAVIRUS 2 BY RT PCR (HOSPITAL ORDER, [facility])  CBC  PROTIME-INR  POC OCCULT BLOOD, ED  TYPE AND SCREEN    ____________________________________________  EKG: None ________________________________________  RADIOLOGY All imaging, including  plain films, CT scans, and ultrasounds, independently reviewed by me, and interpretations confirmed via formal radiology reads.  ED MD interpretation:   CT A/P: Diverticulosis, no active bleeding  Official radiology report(s): Ct Angio Abd/pel W/ And/or W/o  Result Date: 05/26/2019 CLINICAL DATA:  Bright red rectal bleeding since last evening. Recent EGD. History of pulmonary embolism, currently on Coumadin. EXAM: CTA ABDOMEN AND PELVIS WITHOUT AND WITH CONTRAST TECHNIQUE: Multidetector CT imaging of the abdomen and pelvis was performed using the standard protocol during bolus administration of intravenous contrast. Multiplanar reconstructed images and MIPs were obtained and reviewed to evaluate the vascular anatomy. CONTRAST:  194mL OMNIPAQUE IOHEXOL 350 MG/ML SOLN COMPARISON:  CT abdomen pelvis-01/16/2019 FINDINGS: VASCULAR Aorta: There is no significant atherosclerotic plaque within the normal caliber abdominal aorta. No evidence of abdominal aortic dissection or periaortic stranding. Celiac: Widely patent without a hemodynamically significant narrowing. Conventional branching pattern though note is made of a separate origin of the left hepatic artery in lieu of a proper hepatic artery. SMA: Widely patent without a hemodynamically significant narrowing. Conventional branching pattern. The distal tributaries the SMA appear widely patent without discrete intraluminal filling defect to suggest distal embolism. Renals: Solitary bilaterally; both renal arteries are widely patent without hemodynamically significant narrowing. No discrete areas of vessel irregularity to suggest FMD. IMA: Widely patent without a hemodynamically significant narrowing. Inflow: The bilateral common, external and internal iliac arteries are of normal caliber and widely patent without a hemodynamically significant narrowing. Proximal Outflow: The bilateral common and imaged portions of the bilateral deep and superficial femoral  arteries are widely patent without a hemodynamically significant narrowing. Veins: The IVC and pelvic venous systems are widely patent. Review  of the MIP images confirms the above findings. _________________________________________________________ NON-VASCULAR Lower chest: Limited visualization of the lower thorax demonstrates improved aeration of the right costophrenic angle with minimal residual linear heterogeneous opacities favored to represent atelectasis or scarring. Unchanged subsegmental atelectasis within the contralateral left costophrenic angle and medial segment of the right middle lobe. No new focal airspace opacities. No pleural effusion. Normal heart size.  No pericardial effusion. Hepatobiliary: Normal hepatic contour. Suspected perfusion allow abnormality involving the central aspect of the lateral segment of the left lobe of the liver without associated abnormality on the acquired portal venous phase imaging. No discrete hepatic lesions. Normal appearance of the gallbladder given degree distention. No radiopaque gallstones. No intra extrahepatic biliary duct dilatation. No ascites. Pancreas: Normal appearance of the pancreas. Spleen: Normal appearance of the spleen. Adrenals/Urinary Tract: There is symmetric enhancement of the bilateral kidneys. No definite renal stones this postcontrast examination. No discrete renal lesions. No urine obstruction or perinephric stranding. Normal appearance the bilateral adrenal glands. Normal appearance of the urinary bladder given degree distention. Stomach/Bowel: Large colonic stool burden without evidence of enteric obstruction. Scattered colonic diverticulosis without evidence of superimposed acute diverticulitis. Normal appearance of the terminal ileum and the retrocecal appendix. No discrete areas of bowel wall thickening. Potential small hiatal hernia. No pneumoperitoneum, pneumatosis or portal venous gas. There are no discrete areas of intraluminal  contrast extravasation to suggest the etiology of reported history of GI bleeding. Lymphatic: No bulky retroperitoneal, mesenteric, pelvic or inguinal lymphadenopathy. Reproductive: Post hysterectomy. No discrete adnexal lesion. No free fluid within the pelvic cul-de-sac. Other: Tiny mesenteric fat containing peri umbilical hernia. There is a minimal amount of subcutaneous edema about the midline of the low back. Ill-defined areas subcutaneous stranding within the undersurface of the abdominal pannus may represent the sequela of subcutaneous medication administration. Musculoskeletal: No acute or aggressive osseous abnormalities. Mild-to-moderate multilevel lumbar spine DDD, worse at L3-L4 with disc space height loss, endplate irregularity and sclerosis. IMPRESSION: Vascular Impression: 1. No discrete areas of intraluminal contrast extravasation to suggest etiology reported history of GI bleeding. 2. Unremarkable CTA of the abdomen pelvis. Nonvascular Impression: 1. Scattered colonic diverticulosis without evidence superimposed acute diverticulitis. 2. Large colonic stool burden without evidence of enteric obstruction. 3. Improved aeration of the right costophrenic angle with minimal residual atelectasis/scar. Electronically Signed   By: Sandi Mariscal M.D.   On: 05/26/2019 14:49    ____________________________________________  PROCEDURES   Procedure(s) performed (including Critical Care):  Procedures  ____________________________________________  INITIAL IMPRESSION / MDM / [place] / ED COURSE  As part of [name] medical decision making, I reviewed the following data within the electronic MEDICAL RECORD NUMBER Notes from prior ED visits and [place] Controlled Substance Database      *Debara Bryson Ha was evaluated in Emergency Department on 05/26/2019 for the symptoms described in the history of present illness. She was evaluated in the context of the global COVID-19 pandemic, which necessitated  consideration that the patient might be at risk for infection with the SARS-CoV-2 virus that causes COVID-19. Institutional protocols and algorithms that pertain to the evaluation of patients at risk for COVID-19 are in a state of rapid change based on information released by regulatory bodies including the CDC and federal and state organizations. These policies and algorithms were followed during the patient's care in the ED.  Some ED evaluations and interventions may be delayed as a result of limited staffing during the pandemic.*   Clinical Course as of May 26 1519  Fri May 26, 2019  1418 43 yo F here with BRBPR, DDx includes internal hemorrhoids, diverticulosis, mucosal inflammation/irritation 2/2 recent EGD, bleeding from bx site less likely in absence of any darker blood, coffee ground emesis and normal BUN. Will f/u CT. Pt is HDS. Labs reviewed - Hgb is stable. She has no signs of significant blood loss clinically.   [CI]  1440 Plan to discuss with Gi pending results. No apparent obvious source of bleeding noted on external exam. INR now 1 - has been off coumadin per her primary doc.   [CI]    Clinical Course User Index [CI] Duffy Bruce, MD    Medical Decision Making:  As above. Discussed with Dr. Enriqueta Shutter will order NM GI scan today and admit for serial CBC and management. COVID ordered.  ____________________________________________  FINAL CLINICAL IMPRESSION(S) / ED DIAGNOSES  Final diagnoses:  Diverticulosis  Lower GI bleed  Anticoagulated     MEDICATIONS GIVEN DURING THIS VISIT:  Medications  iohexol (OMNIPAQUE) 350 MG/ML injection 100 mL (100 mLs Intravenous Contrast Given 05/26/19 1414)     ED Discharge Orders    None       Note:  This document was prepared using Dragon voice recognition software and may include unintentional dictation errors.   Duffy Bruce, MD 05/26/19 1520

## 2019-05-26 NOTE — ED Triage Notes (Signed)
FIRST NURSE NOTE-here for CT scan. Reports had endoscopy and having bloody stools now and doctor told her to come get scanned. Pt ambulatory. NAD. Unlabored.

## 2019-05-26 NOTE — ED Notes (Signed)
Last BP noted to MD. This RN asked if he wants to address it, MD says no she can go to floor, MD aware

## 2019-05-27 ENCOUNTER — Encounter: Payer: Self-pay | Admitting: Anesthesiology

## 2019-05-27 ENCOUNTER — Inpatient Hospital Stay: Payer: Medicare Other | Admitting: Anesthesiology

## 2019-05-27 ENCOUNTER — Encounter
Admission: EM | Disposition: A | Discharge status: Home / Self Care | Payer: Self-pay | Source: Home / Self Care | Attending: Internal Medicine

## 2019-05-27 DIAGNOSIS — K921 Melena: Secondary | ICD-10-CM

## 2019-05-27 DIAGNOSIS — K648 Other hemorrhoids: Secondary | ICD-10-CM

## 2019-05-27 DIAGNOSIS — K5730 Diverticulosis of large intestine without perforation or abscess without bleeding: Secondary | ICD-10-CM

## 2019-05-27 DIAGNOSIS — K3189 Other diseases of stomach and duodenum: Secondary | ICD-10-CM

## 2019-05-27 HISTORY — PX: ESOPHAGOGASTRODUODENOSCOPY (EGD) WITH PROPOFOL: SHX5813

## 2019-05-27 HISTORY — PX: COLONOSCOPY WITH PROPOFOL: SHX5780

## 2019-05-27 LAB — CBC
HCT: 36.6 % (ref 36.0–46.0)
Hemoglobin: 12.2 g/dL (ref 12.0–15.0)
MCH: 28.3 pg (ref 26.0–34.0)
MCHC: 33.3 g/dL (ref 30.0–36.0)
MCV: 84.9 fL (ref 80.0–100.0)
Platelets: 321 10*3/uL (ref 150–400)
RBC: 4.31 MIL/uL (ref 3.87–5.11)
RDW: 13.4 % (ref 11.5–15.5)
WBC: 9.3 10*3/uL (ref 4.0–10.5)
nRBC: 0 % (ref 0.0–0.2)

## 2019-05-27 LAB — BASIC METABOLIC PANEL
Anion gap: 12 (ref 5–15)
BUN: 10 mg/dL (ref 6–20)
CO2: 22 mmol/L (ref 22–32)
Calcium: 8.6 mg/dL — ABNORMAL LOW (ref 8.9–10.3)
Chloride: 106 mmol/L (ref 98–111)
Creatinine, Ser: 1 mg/dL (ref 0.44–1.00)
GFR calc Af Amer: >60 mL/min (ref >60)
GFR calc non Af Amer: >60 mL/min (ref >60)
Glucose, Bld: 95 mg/dL (ref 70–99)
Potassium: 3.6 mmol/L (ref 3.5–5.1)
Sodium: 140 mmol/L (ref 135–145)

## 2019-05-27 LAB — HEMOGLOBIN AND HEMATOCRIT, BLOOD
HCT: 34.4 % — ABNORMAL LOW (ref 36.0–46.0)
Hemoglobin: 11.5 g/dL — ABNORMAL LOW (ref 12.0–15.0)

## 2019-05-27 LAB — HIV ANTIBODY (ROUTINE TESTING W REFLEX): HIV Screen 4th Generation wRfx: NONREACTIVE

## 2019-05-27 SURGERY — EGD (ESOPHAGOGASTRODUODENOSCOPY)
Anesthesia: General

## 2019-05-27 SURGERY — ESOPHAGOGASTRODUODENOSCOPY (EGD) WITH PROPOFOL
Anesthesia: General

## 2019-05-27 MED ORDER — PROPOFOL 500 MG/50ML IV EMUL
INTRAVENOUS | Status: DC | PRN
  Administered 2019-05-27: 130 ug/kg/min via INTRAVENOUS

## 2019-05-27 MED ORDER — PHENYLEPHRINE HCL (PRESSORS) 10 MG/ML IV SOLN
INTRAVENOUS | Status: DC | PRN
  Administered 2019-05-27 (×2): 100 ug via INTRAVENOUS

## 2019-05-27 MED ORDER — HYDROCORTISONE ACETATE 25 MG RE SUPP
25.0000 mg | Freq: Two times a day (BID) | RECTAL | Status: DC
  Administered 2019-05-27 – 2019-05-30 (×6): 25 mg via RECTAL
  Filled 2019-05-27 (×7): qty 1

## 2019-05-27 MED ORDER — GLYCOPYRROLATE 0.2 MG/ML IJ SOLN
INTRAMUSCULAR | Status: DC | PRN
  Administered 2019-05-27: 0.2 mg via INTRAVENOUS

## 2019-05-27 MED ORDER — PANTOPRAZOLE SODIUM 40 MG PO TBEC
40.0000 mg | DELAYED_RELEASE_TABLET | Freq: Every day | ORAL | Status: DC
  Administered 2019-05-27 – 2019-05-30 (×4): 40 mg via ORAL
  Filled 2019-05-27 (×4): qty 1

## 2019-05-27 MED ORDER — PROPOFOL 500 MG/50ML IV EMUL
INTRAVENOUS | Status: AC
  Filled 2019-05-27: qty 100

## 2019-05-27 MED ORDER — LIDOCAINE HCL (CARDIAC) PF 100 MG/5ML IV SOSY
PREFILLED_SYRINGE | INTRAVENOUS | Status: DC | PRN
  Administered 2019-05-27: 100 mg via INTRATRACHEAL

## 2019-05-27 MED ORDER — MIDAZOLAM HCL 2 MG/2ML IJ SOLN
INTRAMUSCULAR | Status: DC | PRN
  Administered 2019-05-27: 2 mg via INTRAVENOUS

## 2019-05-27 MED ORDER — MIDAZOLAM HCL 2 MG/2ML IJ SOLN
INTRAMUSCULAR | Status: AC
  Filled 2019-05-27: qty 2

## 2019-05-27 MED ORDER — PROPOFOL 10 MG/ML IV BOLUS
INTRAVENOUS | Status: DC | PRN
  Administered 2019-05-27: 50 mg via INTRAVENOUS

## 2019-05-27 MED ORDER — LACTATED RINGERS IV SOLN
INTRAVENOUS | Status: DC | PRN
  Administered 2019-05-27: 10:00:00 via INTRAVENOUS

## 2019-05-27 NOTE — Anesthesia Procedure Notes (Signed)
Performed by: Maynor Mwangi, CRNA Pre-anesthesia Checklist: Patient identified, Emergency Drugs available, Suction available, Patient being monitored and Timeout performed Patient Re-evaluated:Patient Re-evaluated prior to induction Oxygen Delivery Method: Nasal cannula Induction Type: IV induction       

## 2019-05-27 NOTE — Anesthesia Post-op Follow-up Note (Signed)
Anesthesia QCDR form completed.        

## 2019-05-27 NOTE — Progress Notes (Signed)
15 minute call to floor, and report given to [employer].

## 2019-05-27 NOTE — Anesthesia Preprocedure Evaluation (Signed)
Anesthesia Evaluation  Patient identified by MRN, date of birth, ID band Patient awake    Reviewed: Allergy & Precautions, H&P , NPO status , Patient's Chart, lab work & pertinent test results, reviewed documented beta blocker date and time   History of Anesthesia Complications Negative for: history of anesthetic complications  Airway Mallampati: III  TM Distance: >3 FB Neck ROM: full    Dental  (+) Dental Advidsory Given, Caps, Teeth Intact   Pulmonary neg shortness of breath, asthma , neg sleep apnea, neg recent URI,    Pulmonary exam normal breath sounds clear to auscultation       Cardiovascular Exercise Tolerance: Good hypertension, (-) angina(-) Past MI and (-) Cardiac Stents Normal cardiovascular exam(-) dysrhythmias (-) Valvular Problems/Murmurs Rhythm:regular Rate:Normal     Neuro/Psych  Headaches, neg Seizures PSYCHIATRIC DISORDERS Anxiety Depression  Neuromuscular disease (fibromyalgia)    GI/Hepatic Neg liver ROS, GERD  ,  Endo/Other  neg diabetesHypothyroidism Morbid obesity  Renal/GU Renal disease (kidney stone)  negative genitourinary   Musculoskeletal   Abdominal   Peds  Hematology  (+) Blood dyscrasia, anemia ,   Anesthesia Other Findings Past Medical History: No date: Anemia No date: Anxiety No date: Asthma No date: Depression No date: Fibromyalgia No date: GERD (gastroesophageal reflux disease) No date: Hypertension No date: Irritable bowel syndrome (IBS) No date: Pseudotumor cerebri No date: Pulmonary embolism (HCC) No date: Raynaud's disease No date: Thyroid disease     Comment:  hypothyroid No date: Vision disturbance   Reproductive/Obstetrics negative OB ROS                             Anesthesia Physical  Anesthesia Plan  ASA: III  Anesthesia Plan: General   Post-op Pain Management:    Induction: Intravenous  PONV Risk Score and Plan: 3 and  Propofol infusion and TIVA  Airway Management Planned: Natural Airway and Nasal Cannula  Additional Equipment:   Intra-op Plan:   Post-operative Plan:   Informed Consent: I have reviewed the patients History and Physical, chart, labs and discussed the procedure including the risks, benefits and alternatives for the proposed anesthesia with the patient or authorized representative who has indicated his/her understanding and acceptance.     Dental Advisory Given  Plan Discussed with: Anesthesiologist, CRNA and Surgeon  Anesthesia Plan Comments:         Anesthesia Quick Evaluation  

## 2019-05-27 NOTE — Progress Notes (Signed)
[facility] [place] NAME: Angela [name]    MR#:  [id number]  DATE OF BIRTH:  Dec 19, 1975  SUBJECTIVE:  CHIEF COMPLAINT: Patient is seen after EGD and colonoscopy resting comfortably no active bleeding  REVIEW OF SYSTEMS:  CONSTITUTIONAL: No fever, fatigue or weakness.  EYES: No blurred or double vision.  EARS, NOSE, AND THROAT: No tinnitus or ear pain.  RESPIRATORY: No cough, shortness of breath, wheezing or hemoptysis.  CARDIOVASCULAR: No chest pain, orthopnea, edema.  GASTROINTESTINAL: No nausea, vomiting, diarrhea or abdominal pain.  GENITOURINARY: No dysuria, hematuria.  ENDOCRINE: No polyuria, nocturia,  HEMATOLOGY: No anemia, easy bruising or bleeding SKIN: No rash or lesion. MUSCULOSKELETAL: No joint pain or arthritis.   NEUROLOGIC: No tingling, numbness, weakness.  PSYCHIATRY: No anxiety or depression.   DRUG ALLERGIES:   Allergies  Allergen Reactions  . Amoxicillin Hives    Did it involve swelling of the face/tongue/throat, SOB, or low BP? No Did it involve sudden or severe rash/hives, skin peeling, or any reaction on the inside of your mouth or nose? No Did you need to seek medical attention at a hospital or doctor's office? Unknown When did it last happen?5+ years If all above answers are "NO", may proceed with cephalosporin use.   Marcelline Mates     Break out in mouth  . Lisinopril Cough  . Nortriptyline     Unknown reaction   . Orange Fruit [Citrus]     Break out in mouth  . Other     Sulfa eye drops- Scratched corneas  . Penicillins Hives    Did it involve swelling of the face/tongue/throat, SOB, or low BP? No Did it involve sudden or severe rash/hives, skin peeling, or any reaction on the inside of your mouth or nose? No Did you need to seek medical attention at a hospital or doctor's office? Unknown When did it last happen?5+ years If all above answers are "NO", may proceed with cephalosporin  use.     VITALS:  Blood pressure (!) 116/55, pulse (!) 102, temperature 98.1 F (36.7 C), temperature source Oral, resp. rate 20, height 5\' 3"  (1.6 m), weight 136 kg, SpO2 99 %.  PHYSICAL EXAMINATION:  GENERAL:  43 y.o.-year-old patient lying in the bed with no acute distress.  EYES: Pupils equal, round, reactive to light and accommodation. No scleral icterus. Extraocular muscles intact.  HEENT: Head atraumatic, normocephalic. Oropharynx and nasopharynx clear.  NECK:  Supple, no jugular venous distention. No thyroid enlargement, no tenderness.  LUNGS: Normal breath sounds bilaterally, no wheezing, rales,rhonchi or crepitation. No use of accessory muscles of respiration.  CARDIOVASCULAR: S1, S2 normal. No murmurs, rubs, or gallops.  ABDOMEN: Soft, nontender, nondistended. Bowel sounds present EXTREMITIES: No pedal edema, cyanosis, or clubbing.  NEUROLOGIC: Cranial nerves II through XII are intact. Muscle strength 5/5 in all extremities. Sensation intact. Gait not checked.  PSYCHIATRIC: The patient is alert and oriented x 3.  SKIN: No obvious rash, lesion, or ulcer.    LABORATORY PANEL:   CBC Recent Labs  Lab 05/27/19 0519  WBC 9.3  HGB 12.2  HCT 36.6  PLT 321   ------------------------------------------------------------------------------------------------------------------  Chemistries  Recent Labs  Lab 05/26/19 1033 05/27/19 0519  NA 140 140  K 4.1 3.6  CL 108 106  CO2 23 22  GLUCOSE 99 95  BUN 13 10  CREATININE 0.90 1.00  CALCIUM 9.2 8.6*  AST 13*  --   ALT 13  --  ALKPHOS 44  --   BILITOT 0.7  --    ------------------------------------------------------------------------------------------------------------------  Cardiac Enzymes No results for input(s): TROPONINI in the last 168 hours. ------------------------------------------------------------------------------------------------------------------  RADIOLOGY:  Nm Gi Blood Loss  Result Date:  05/26/2019 CLINICAL DATA:  Bright red blood in bowel last. EXAM: NUCLEAR MEDICINE GASTROINTESTINAL BLEEDING SCAN TECHNIQUE: Sequential abdominal images were obtained following intravenous administration of Tc-69m labeled red blood cells. RADIOPHARMACEUTICALS:  21.2 mCi Tc-64m pertechnetate in-vitro labeled red cells. COMPARISON:  None. FINDINGS: No evidence of GI bleed. IMPRESSION: No evidence of GI bleed. Electronically Signed   By: Dorise Bullion III M.D   On: 05/26/2019 18:07   Ct Angio Abd/pel W/ And/or W/o  Result Date: 05/26/2019 CLINICAL DATA:  Bright red rectal bleeding since last evening. Recent EGD. History of pulmonary embolism, currently on Coumadin. EXAM: CTA ABDOMEN AND PELVIS WITHOUT AND WITH CONTRAST TECHNIQUE: Multidetector CT imaging of the abdomen and pelvis was performed using the standard protocol during bolus administration of intravenous contrast. Multiplanar reconstructed images and MIPs were obtained and reviewed to evaluate the vascular anatomy. CONTRAST:  165mL OMNIPAQUE IOHEXOL 350 MG/ML SOLN COMPARISON:  CT abdomen pelvis-01/16/2019 FINDINGS: VASCULAR Aorta: There is no significant atherosclerotic plaque within the normal caliber abdominal aorta. No evidence of abdominal aortic dissection or periaortic stranding. Celiac: Widely patent without a hemodynamically significant narrowing. Conventional branching pattern though note is made of a separate origin of the left hepatic artery in lieu of a proper hepatic artery. SMA: Widely patent without a hemodynamically significant narrowing. Conventional branching pattern. The distal tributaries the SMA appear widely patent without discrete intraluminal filling defect to suggest distal embolism. Renals: Solitary bilaterally; both renal arteries are widely patent without hemodynamically significant narrowing. No discrete areas of vessel irregularity to suggest FMD. IMA: Widely patent without a hemodynamically significant narrowing. Inflow:  The bilateral common, external and internal iliac arteries are of normal caliber and widely patent without a hemodynamically significant narrowing. Proximal Outflow: The bilateral common and imaged portions of the bilateral deep and superficial femoral arteries are widely patent without a hemodynamically significant narrowing. Veins: The IVC and pelvic venous systems are widely patent. Review of the MIP images confirms the above findings. _________________________________________________________ NON-VASCULAR Lower chest: Limited visualization of the lower thorax demonstrates improved aeration of the right costophrenic angle with minimal residual linear heterogeneous opacities favored to represent atelectasis or scarring. Unchanged subsegmental atelectasis within the contralateral left costophrenic angle and medial segment of the right middle lobe. No new focal airspace opacities. No pleural effusion. Normal heart size.  No pericardial effusion. Hepatobiliary: Normal hepatic contour. Suspected perfusion allow abnormality involving the central aspect of the lateral segment of the left lobe of the liver without associated abnormality on the acquired portal venous phase imaging. No discrete hepatic lesions. Normal appearance of the gallbladder given degree distention. No radiopaque gallstones. No intra extrahepatic biliary duct dilatation. No ascites. Pancreas: Normal appearance of the pancreas. Spleen: Normal appearance of the spleen. Adrenals/Urinary Tract: There is symmetric enhancement of the bilateral kidneys. No definite renal stones this postcontrast examination. No discrete renal lesions. No urine obstruction or perinephric stranding. Normal appearance the bilateral adrenal glands. Normal appearance of the urinary bladder given degree distention. Stomach/Bowel: Large colonic stool burden without evidence of enteric obstruction. Scattered colonic diverticulosis without evidence of superimposed acute  diverticulitis. Normal appearance of the terminal ileum and the retrocecal appendix. No discrete areas of bowel wall thickening. Potential small hiatal hernia. No pneumoperitoneum, pneumatosis or portal venous gas. There are no discrete  areas of intraluminal contrast extravasation to suggest the etiology of reported history of GI bleeding. Lymphatic: No bulky retroperitoneal, mesenteric, pelvic or inguinal lymphadenopathy. Reproductive: Post hysterectomy. No discrete adnexal lesion. No free fluid within the pelvic cul-de-sac. Other: Tiny mesenteric fat containing peri umbilical hernia. There is a minimal amount of subcutaneous edema about the midline of the low back. Ill-defined areas subcutaneous stranding within the undersurface of the abdominal pannus may represent the sequela of subcutaneous medication administration. Musculoskeletal: No acute or aggressive osseous abnormalities. Mild-to-moderate multilevel lumbar spine DDD, worse at L3-L4 with disc space height loss, endplate irregularity and sclerosis. IMPRESSION: Vascular Impression: 1. No discrete areas of intraluminal contrast extravasation to suggest etiology reported history of GI bleeding. 2. Unremarkable CTA of the abdomen pelvis. Nonvascular Impression: 1. Scattered colonic diverticulosis without evidence superimposed acute diverticulitis. 2. Large colonic stool burden without evidence of enteric obstruction. 3. Improved aeration of the right costophrenic angle with minimal residual atelectasis/scar. Electronically Signed   By: Sandi Mariscal M.D.   On: 05/26/2019 14:49    EKG:   Orders placed or performed during the hospital encounter of 02/06/19  . EKG    ASSESSMENT AND PLAN:   #Acute GI bleed Tagged RBC scan no active bleeding noticed 05/26/2019 Patient had colonoscopy on 05/27/2019-she has revealed nonbleeding internal hemorrhoids recommending Anusol suppository twice a day for 10 days Patient had EGD on 05/27/2019 which has revealed no  acute findings.  Recommending Protonix once a day and avoid NSAIDs.  Aspirin is okay if medically indicated.  Check H. Pylori Patient is hemodynamically stable Appreciate GI recommendations CT angiogram of the abdomen pelvis with scattered diverticulosis no 90s   #History of pulmonary embolism x2 INR 1 in the ED Holding home Coumadin Patient is supposed to have IVC filter placed by Dr. Delana Meyer on 10/21.  Will consult vascular surgery for possible IVC filter placement on Monday  #Constipation CT abdomen and pelvis with a large stool burden -Continue Linzess  #Hyperlipidemia continue home medication Crestor  #Hypothyroidism continue Synthyroid  #History of pseudotumor tumor cerebri clinically stable Tylenol as needed and continue home medication Topamax  #Depression with anxiety stable continue home medication Wellbutrin     All the records are reviewed and case discussed with Care Management/Social Workerr. Management plans discussed with the patient, family and they are in agreement.  CODE STATUS: fc   TOTAL TIME TAKING CARE OF THIS PATIENT: 35  minutes.   POSSIBLE D/C IN 1-2 DAYS, DEPENDING ON CLINICAL CONDITION.  Note: This dictation was prepared with Dragon dictation along with smaller phrase technology. Any transcriptional errors that result from this process are unintentional.   Nicholes Mango M.D on 05/27/2019 at 3:46 PM  Between 7am to 6pm - Pager - [phone] After 6pm go to [email or web address] - password [email or web address] [facility]  Office  [phone]  CC: Primary care physician; Sharyne Peach, MD

## 2019-05-27 NOTE — Progress Notes (Signed)
Angela Antigua, MD [address], [place], [place], [place], [address] [address], [place], [place], [place], [address] Phone: [phone]  Fax: [phone]   Subjective: RBC scan was negative yesterday.  Patient had grossly bloody stool on rectal exam in ER by ER physician yesterday and had hematochezia in the ER as well.  Hemoglobin has stayed normal.   Objective: Exam: Vital signs in last 24 hours: Vitals:   05/26/19 2030 05/26/19 2215 05/26/19 2250 05/27/19 0550  BP: (!) 154/97 (!) 185/109 95/64 (!) 106/41  Pulse: 85 80 69 74  Resp:    18  Temp:  98.4 F (36.9 C)  98.1 F (36.7 C)  TempSrc:  Oral  Oral  SpO2: 100% 100%  100%  Weight:      Height:       Weight change:   Intake/Output Summary (Last 24 hours) at 05/27/2019 [id number] Last data filed at 05/27/2019 0500 Gross per 24 hour  Intake 374.15 ml  Output --  Net 374.15 ml    General: No acute distress, AAO x3 Abd: Soft, NT/ND, No HSM Skin: Warm, no rashes Neck: Supple, Trachea midline   Lab Results: Lab Results  Component Value Date   WBC 9.3 05/27/2019   HGB 12.2 05/27/2019   HCT 36.6 05/27/2019   MCV 84.9 05/27/2019   PLT 321 05/27/2019   Micro Results: Recent Results (from the past 240 hour(s))  SARS CORONAVIRUS 2 (TAT 6-24 HRS) Nasopharyngeal Nasopharyngeal Swab     Status: None   Collection Time: 05/26/19 10:32 AM   Specimen: Nasopharyngeal Swab  Result Value Ref Range Status   SARS Coronavirus 2 NEGATIVE NEGATIVE Final    Comment: (NOTE) SARS-CoV-2 target nucleic acids are NOT DETECTED. The SARS-CoV-2 RNA is generally detectable in upper and lower respiratory specimens during the acute phase of infection. Negative results do not preclude SARS-CoV-2 infection, do not rule out co-infections with other pathogens, and should not be used as the sole basis for treatment or other patient management decisions. Negative results must be combined with clinical observations, patient history,  and epidemiological information. The expected result is Negative. Fact Sheet for Patients: SugarRoll.be Fact Sheet for Healthcare Providers: https://www.woods-mathews.com/ This test is not yet approved or cleared by the [place] FDA and  has been authorized for detection and/or diagnosis of SARS-CoV-2 by FDA under an Emergency Use Authorization (EUA). This EUA will remain  in effect (meaning this test can be used) for the duration of the COVID-19 declaration under Section 56 4(b)(1) of the Act, 21 U.S.C. section 360bbb-3(b)(1), unless the authorization is terminated or revoked sooner. Performed at [facility], [place] [address] [place], [place] [address]    Studies/Results: Nm Gi Blood Loss  Result Date: 05/26/2019 CLINICAL DATA:  Bright red blood in bowel last. EXAM: NUCLEAR MEDICINE GASTROINTESTINAL BLEEDING SCAN TECHNIQUE: Sequential abdominal images were obtained following intravenous administration of Tc-82m labeled red blood cells. RADIOPHARMACEUTICALS:  21.2 mCi Tc-98m pertechnetate in-vitro labeled red cells. COMPARISON:  None. FINDINGS: No evidence of GI bleed. IMPRESSION: No evidence of GI bleed. Electronically Signed   By: Dorise Bullion III M.D   On: 05/26/2019 18:07   Ct Angio Abd/pel W/ And/or W/o  Result Date: 05/26/2019 CLINICAL DATA:  Bright red rectal bleeding since last evening. Recent EGD. History of pulmonary embolism, currently on Coumadin. EXAM: CTA ABDOMEN AND PELVIS WITHOUT AND WITH CONTRAST TECHNIQUE: Multidetector CT imaging of the abdomen and pelvis was performed using the standard protocol during bolus administration  of intravenous contrast. Multiplanar reconstructed images and MIPs were obtained and reviewed to evaluate the vascular anatomy. CONTRAST:  117mL OMNIPAQUE IOHEXOL 350 MG/ML SOLN COMPARISON:  CT abdomen pelvis-01/16/2019 FINDINGS: VASCULAR Aorta: There is no significant atherosclerotic plaque within  the normal caliber abdominal aorta. No evidence of abdominal aortic dissection or periaortic stranding. Celiac: Widely patent without a hemodynamically significant narrowing. Conventional branching pattern though note is made of a separate origin of the left hepatic artery in lieu of a proper hepatic artery. SMA: Widely patent without a hemodynamically significant narrowing. Conventional branching pattern. The distal tributaries the SMA appear widely patent without discrete intraluminal filling defect to suggest distal embolism. Renals: Solitary bilaterally; both renal arteries are widely patent without hemodynamically significant narrowing. No discrete areas of vessel irregularity to suggest FMD. IMA: Widely patent without a hemodynamically significant narrowing. Inflow: The bilateral common, external and internal iliac arteries are of normal caliber and widely patent without a hemodynamically significant narrowing. Proximal Outflow: The bilateral common and imaged portions of the bilateral deep and superficial femoral arteries are widely patent without a hemodynamically significant narrowing. Veins: The IVC and pelvic venous systems are widely patent. Review of the MIP images confirms the above findings. _________________________________________________________ NON-VASCULAR Lower chest: Limited visualization of the lower thorax demonstrates improved aeration of the right costophrenic angle with minimal residual linear heterogeneous opacities favored to represent atelectasis or scarring. Unchanged subsegmental atelectasis within the contralateral left costophrenic angle and medial segment of the right middle lobe. No new focal airspace opacities. No pleural effusion. Normal heart size.  No pericardial effusion. Hepatobiliary: Normal hepatic contour. Suspected perfusion allow abnormality involving the central aspect of the lateral segment of the left lobe of the liver without associated abnormality on the acquired  portal venous phase imaging. No discrete hepatic lesions. Normal appearance of the gallbladder given degree distention. No radiopaque gallstones. No intra extrahepatic biliary duct dilatation. No ascites. Pancreas: Normal appearance of the pancreas. Spleen: Normal appearance of the spleen. Adrenals/Urinary Tract: There is symmetric enhancement of the bilateral kidneys. No definite renal stones this postcontrast examination. No discrete renal lesions. No urine obstruction or perinephric stranding. Normal appearance the bilateral adrenal glands. Normal appearance of the urinary bladder given degree distention. Stomach/Bowel: Large colonic stool burden without evidence of enteric obstruction. Scattered colonic diverticulosis without evidence of superimposed acute diverticulitis. Normal appearance of the terminal ileum and the retrocecal appendix. No discrete areas of bowel wall thickening. Potential small hiatal hernia. No pneumoperitoneum, pneumatosis or portal venous gas. There are no discrete areas of intraluminal contrast extravasation to suggest the etiology of reported history of GI bleeding. Lymphatic: No bulky retroperitoneal, mesenteric, pelvic or inguinal lymphadenopathy. Reproductive: Post hysterectomy. No discrete adnexal lesion. No free fluid within the pelvic cul-de-sac. Other: Tiny mesenteric fat containing peri umbilical hernia. There is a minimal amount of subcutaneous edema about the midline of the low back. Ill-defined areas subcutaneous stranding within the undersurface of the abdominal pannus may represent the sequela of subcutaneous medication administration. Musculoskeletal: No acute or aggressive osseous abnormalities. Mild-to-moderate multilevel lumbar spine DDD, worse at L3-L4 with disc space height loss, endplate irregularity and sclerosis. IMPRESSION: Vascular Impression: 1. No discrete areas of intraluminal contrast extravasation to suggest etiology reported history of GI bleeding. 2.  Unremarkable CTA of the abdomen pelvis. Nonvascular Impression: 1. Scattered colonic diverticulosis without evidence superimposed acute diverticulitis. 2. Large colonic stool burden without evidence of enteric obstruction. 3. Improved aeration of the right costophrenic angle with minimal residual atelectasis/scar. Electronically Signed  By: Sandi Mariscal M.D.   On: 05/26/2019 14:49   Medications:  Scheduled Meds:  buPROPion  150 mg Oral BID   levothyroxine  150 mcg Oral QAC breakfast   oxybutynin  5 mg Oral QHS   pantoprazole (PROTONIX) IV  40 mg Intravenous Q12H   rosuvastatin  20 mg Oral q1800   sodium fluoride  1 application dental QHS   topiramate  50 mg Oral BID   Continuous Infusions:  sodium chloride 75 mL/hr at 05/27/19 0500   PRN Meds:.acetaminophen **OR** acetaminophen, albuterol, cyclobenzaprine, Difluprednate, diphenhydrAMINE, fluticasone, linaclotide, Melatonin, ondansetron **OR** ondansetron (ZOFRAN) IV, polyethylene glycol, polyvinyl alcohol, prednisoLONE acetate, promethazine, tiZANidine, traMADol   Assessment: Active Problems:   GI bleed    Plan: Proceed with upper and lower endoscopy today to evaluate source of hematochezia I have discussed alternative options, risks & benefits,  which include, but are not limited to, bleeding, infection, perforation,respiratory complication & drug reaction.  The patient agrees with this plan & written consent will be obtained.      LOS: 1 day   Angela Antigua, MD 05/27/2019, 8:11 AM

## 2019-05-27 NOTE — Op Note (Signed)
[facility] Patient Name: Angela [name] Procedure Date: 05/27/2019 9:12 AM MRN: [id number] Account #: [id number] Date of Birth: [birth year] Admit Type: Inpatient Age: 43 Room: [facility] ENDO ROOM 4 Gender: Female Note Status: Finalized Procedure:            Colonoscopy Indications:          Hematochezia Providers:            [name] MD, MD Referring MD:         [name] MD, MD (Referring MD) Medicines:            Monitored Anesthesia Care Complications:        No immediate complications. Procedure:            Pre-Anesthesia Assessment:                       - Prior to the procedure, a History and Physical was                        performed, and patient medications, allergies and                        sensitivities were reviewed. The patient's tolerance of                        previous anesthesia was reviewed.                       - The risks and benefits of the procedure and the                        sedation options and risks were discussed with the                        patient. All questions were answered and informed                        consent was obtained.                       - Patient identification and proposed procedure were                        verified prior to the procedure by the physician, the                        nurse, the anesthetist and the technician. The                        procedure was verified in the pre-procedure area in the                        procedure room in the endoscopy suite.                       - ASA Grade Assessment: II - A patient with mild                        systemic disease.                       -  After reviewing the risks and benefits, the patient                        was deemed in satisfactory condition to undergo the                        procedure.                       After obtaining informed consent, the colonoscope was                        passed under  direct vision. Throughout the procedure,                        the patient's blood pressure, pulse, and oxygen                        saturations were monitored continuously. The                        Colonoscope was introduced through the anus and                        advanced to the the terminal ileum. The colonoscopy was                        performed with ease. The patient tolerated the                        procedure well. The quality of the bowel preparation                        was good. Findings:      The perianal and digital rectal examinations were normal.      A few diverticula were found in the sigmoid colon.      The exam was otherwise without abnormality.      The rectum, sigmoid colon, descending colon, transverse colon, ascending       colon, cecum and ileum appeared normal.      Non-bleeding internal hemorrhoids were found during endoscopy.      The retroflexed view of the distal rectum and anal verge was normal and       showed no anal or rectal abnormalities. Impression:           - No evidence of active or recent bleeding seen                        throughout the exam.                       - Diverticulosis in the sigmoid colon.                       - Non-bleeding internal hemorrhoids.                       - The examination was otherwise normal.                       - The rectum, sigmoid colon, descending colon,  transverse colon, ascending colon, cecum and terminal                        ileum are normal.                       - The distal rectum and anal verge are normal on                        retroflexion view.                       - No specimens collected.                       - The source of patient's bleeding were likely the                        internal hemorrhoids. Normal Hemoglobin, resolution of                        active bleeding over the last 24 hours, and no blood                        throughout the exam  today are all consistent with the                        above. Recommendation:       - Anusol (pramoxine) suppository: Insert rectally BID                        for 10 days.                       - High fiber diet.                       - Resume previous diet.                       - Continue present medications.                       - Return to primary care physician as previously                        scheduled.                       - The findings and recommendations were discussed with                        the patient.                       - High fiber diet. Procedure Code(s):    --- Professional ---                       [phone], Colonoscopy, flexible; diagnostic, including                        collection of specimen(s) by brushing or washing, when  performed (separate procedure) Diagnosis Code(s):    --- Professional ---                       K64.8, Other hemorrhoids                       K92.1, Melena (includes Hematochezia)                       K57.30, Diverticulosis of large intestine without                        perforation or abscess without bleeding CPT copyright 2019 American Medical Association. All rights reserved. The codes documented in this report are preliminary and upon coder review may  be revised to meet current compliance requirements.  [name], MD [name] MD, MD 05/27/2019 9:58:23 AM This report has been signed electronically. Number of Addenda: 0 Note Initiated On: 05/27/2019 9:12 AM Scope Withdrawal Time: 0 hours 13 minutes 35 seconds  Total Procedure Duration: 0 hours 18 minutes 5 seconds       [facility]

## 2019-05-27 NOTE — Anesthesia Postprocedure Evaluation (Signed)
Anesthesia Post Note  Patient: Toniyah Salvaggio  Procedure(s) Performed: ESOPHAGOGASTRODUODENOSCOPY (EGD) WITH PROPOFOL (N/A ) COLONOSCOPY WITH PROPOFOL (N/A )  Patient location during evaluation: PACU Anesthesia Type: General Level of consciousness: awake and alert Pain management: pain level controlled Vital Signs Assessment: post-procedure vital signs reviewed and stable Respiratory status: spontaneous breathing, nonlabored ventilation, respiratory function stable and patient connected to nasal cannula oxygen Cardiovascular status: blood pressure returned to baseline and stable Postop Assessment: no apparent nausea or vomiting Anesthetic complications: no     Last Vitals:  Vitals:   05/27/19 1010 05/27/19 1020  BP: 115/78 (!) 127/51  Pulse: 84 81  Resp: 18 16  Temp:    SpO2: 100% 100%    Last Pain:  Vitals:   05/27/19 1020  TempSrc:   PainSc: 0-No pain                 Martha Clan

## 2019-05-27 NOTE — Op Note (Signed)
[facility] Patient Name: Angela [name] Procedure Date: 05/27/2019 9:13 AM MRN: [id number] Account #: [id number] Date of Birth: January 17, 1976 Admit Type: Inpatient Age: 43 Room: [facility] ENDO ROOM 4 Gender: Female Note Status: Finalized Procedure:            Upper GI endoscopy Indications:          Melena Providers:            Malva Diesing B. Bonna Gains MD, MD Referring MD:         [name] MD, MD (Referring MD) Medicines:            Monitored Anesthesia Care Complications:        No immediate complications. Procedure:            Pre-Anesthesia Assessment:                       - Prior to the procedure, a History and Physical was                        performed, and patient medications, allergies and                        sensitivities were reviewed. The patient's tolerance of                        previous anesthesia was reviewed.                       - The risks and benefits of the procedure and the                        sedation options and risks were discussed with the                        patient. All questions were answered and informed                        consent was obtained.                       - Patient identification and proposed procedure were                        verified prior to the procedure by the physician, the                        nurse, the anesthesiologist, the anesthetist and the                        technician. The procedure was verified in the procedure                        room.                       - ASA Grade Assessment: II - A patient with mild                        systemic disease.  After obtaining informed consent, the endoscope was                        passed under direct vision. Throughout the procedure,                        the patient's blood pressure, pulse, and oxygen                        saturations were monitored continuously. The Endoscope   was introduced through the mouth, and advanced to the                        second part of duodenum. The upper GI endoscopy was                        accomplished with ease. The patient tolerated the                        procedure well. Findings:      The examined esophagus was normal.      A few, 3 to 5 mm non-bleeding erosions were found in the gastric antrum.       There were no stigmata of recent bleeding.      The exam of the stomach was otherwise normal.      The duodenal bulb, second portion of the duodenum and examined duodenum       were normal. Impression:           - No evidence of active or recent bleeding seen                        throughout the exam.                       - Normal esophagus.                       - Non-bleeding erosive gastropathy.                       - Normal duodenal bulb, second portion of the duodenum                        and examined duodenum.                       - No specimens collected. Recommendation:       - Continue present medications.                       - Patient has a contact number available for                        emergencies. The signs and symptoms of potential                        delayed complications were discussed with the patient.                        Return to normal activities tomorrow. Written discharge  instructions were provided to the patient.                       - The findings and recommendations were discussed with                        the patient.                       - Use Protonix (pantoprazole) 40 mg PO daily for 4                        weeks.                       - Perform an H. pylori serology today.                       - Avoid NSAIDs except Aspirin if medically indicated by                        PCP Procedure Code(s):    --- Professional ---                       [phone], Esophagogastroduodenoscopy, flexible, transoral;                        diagnostic, including  collection of specimen(s) by                        brushing or washing, when performed (separate procedure) Diagnosis Code(s):    --- Professional ---                       K31.89, Other diseases of stomach and duodenum                       K92.1, Melena (includes Hematochezia) CPT copyright 2019 American Medical Association. All rights reserved. The codes documented in this report are preliminary and upon coder review may  be revised to meet current compliance requirements.  [name], MD [name] MD, MD 05/27/2019 9:28:47 AM This report has been signed electronically. Number of Addenda: 0 Note Initiated On: 05/27/2019 9:13 AM Estimated Blood Loss: Estimated blood loss: none.      [facility]

## 2019-05-27 NOTE — Transfer of Care (Signed)
Immediate Anesthesia Transfer of Care Note  Patient: Angela [name]  Procedure(s) Performed: ESOPHAGOGASTRODUODENOSCOPY (EGD) WITH PROPOFOL (N/A ) COLONOSCOPY WITH PROPOFOL (N/A )  Patient Location: PACU  Anesthesia Type:General  Level of Consciousness: awake and alert   Airway & Oxygen Therapy: Patient Spontanous Breathing and Patient connected to nasal cannula oxygen  Post-op Assessment: Report given to RN and Post -op Vital signs reviewed and stable  Post vital signs: Reviewed and stable  Last Vitals:  Vitals Value Taken Time  BP 100/70   Temp    Pulse 94 05/27/19 0958  Resp 25 05/27/19 0958  SpO2 98 % 05/27/19 0958  Vitals shown include unvalidated device data.  Last Pain:  Vitals:   05/27/19 0550  TempSrc: Oral  PainSc:       Patients Stated Pain Goal: 0 ([id number])  Complications: No apparent anesthesia complications

## 2019-05-28 ENCOUNTER — Inpatient Hospital Stay: Payer: Medicare Other

## 2019-05-28 LAB — CBC
HCT: 34.2 % — ABNORMAL LOW (ref 36.0–46.0)
Hemoglobin: 11.2 g/dL — ABNORMAL LOW (ref 12.0–15.0)
MCH: 28 pg (ref 26.0–34.0)
MCHC: 32.7 g/dL (ref 30.0–36.0)
MCV: 85.5 fL (ref 80.0–100.0)
Platelets: 279 10*3/uL (ref 150–400)
RBC: 4 MIL/uL (ref 3.87–5.11)
RDW: 13.5 % (ref 11.5–15.5)
WBC: 8.5 10*3/uL (ref 4.0–10.5)
nRBC: 0 % (ref 0.0–0.2)

## 2019-05-28 MED ORDER — GUAIFENESIN-DM 100-10 MG/5ML PO SYRP
5.0000 mL | ORAL_SOLUTION | ORAL | Status: DC | PRN
  Administered 2019-05-28: 5 mL via ORAL
  Filled 2019-05-28: qty 5

## 2019-05-28 MED ORDER — MENTHOL 3 MG MT LOZG
1.0000 | LOZENGE | OROMUCOSAL | Status: DC | PRN
  Filled 2019-05-28: qty 9

## 2019-05-28 NOTE — Progress Notes (Signed)
Angela Antigua, MD [address] [place], [place], [place], [address] [address], [place], [place], [place], [address] Phone: [phone]  Fax: [phone]   Subjective: No further active GI bleeding.  No abdominal pain   Objective: Exam: Vital signs in last 24 hours: Vitals:   05/27/19 1020 05/27/19 1232 05/27/19 2010 05/28/19 0437  BP: (!) 127/51 (!) 116/55 (!) 118/58 114/77  Pulse: 81 (!) 102 87 81  Resp: 16 20 20 18   Temp:  98.1 F (36.7 C) 98.1 F (36.7 C) 98.8 F (37.1 C)  TempSrc:  Oral Oral Oral  SpO2: 100% 99% 99% 100%  Weight:      Height:       Weight change:   Intake/Output Summary (Last 24 hours) at 05/28/2019 0930 Last data filed at 05/28/2019 [id number] Gross per 24 hour  Intake 2023.68 ml  Output 800 ml  Net 1223.68 ml    General: No acute distress, AAO x3 Abd: Soft, NT/ND, No HSM Skin: Warm, no rashes Neck: Supple, Trachea midline   Lab Results: Lab Results  Component Value Date   WBC 8.5 05/28/2019   HGB 11.2 (L) 05/28/2019   HCT 34.2 (L) 05/28/2019   MCV 85.5 05/28/2019   PLT 279 05/28/2019   Micro Results: Recent Results (from the past 240 hour(s))  SARS CORONAVIRUS 2 (TAT 6-24 HRS) Nasopharyngeal Nasopharyngeal Swab     Status: None   Collection Time: 05/26/19 10:32 AM   Specimen: Nasopharyngeal Swab  Result Value Ref Range Status   SARS Coronavirus 2 NEGATIVE NEGATIVE Final    Comment: (NOTE) SARS-CoV-2 target nucleic acids are NOT DETECTED. The SARS-CoV-2 RNA is generally detectable in upper and lower respiratory specimens during the acute phase of infection. Negative results do not preclude SARS-CoV-2 infection, do not rule out co-infections with other pathogens, and should not be used as the sole basis for treatment or other patient management decisions. Negative results must be combined with clinical observations, patient history, and epidemiological information. The expected result is Negative. Fact Sheet for  Patients: SugarRoll.be Fact Sheet for Healthcare Providers: https://www.woods-mathews.com/ This test is not yet approved or cleared by the [place] FDA and  has been authorized for detection and/or diagnosis of SARS-CoV-2 by FDA under an Emergency Use Authorization (EUA). This EUA will remain  in effect (meaning this test can be used) for the duration of the COVID-19 declaration under Section 56 4(b)(1) of the Act, 21 U.S.C. section 360bbb-3(b)(1), unless the authorization is terminated or revoked sooner. Performed at [facility], [place] [address] [place], [place] [address]    Studies/Results: Nm Gi Blood Loss  Result Date: 05/26/2019 CLINICAL DATA:  Bright red blood in bowel last. EXAM: NUCLEAR MEDICINE GASTROINTESTINAL BLEEDING SCAN TECHNIQUE: Sequential abdominal images were obtained following intravenous administration of Tc-3m labeled red blood cells. RADIOPHARMACEUTICALS:  21.2 mCi Tc-63m pertechnetate in-vitro labeled red cells. COMPARISON:  None. FINDINGS: No evidence of GI bleed. IMPRESSION: No evidence of GI bleed. Electronically Signed   By: Dorise Bullion III M.D   On: 05/26/2019 18:07   Ct Angio Abd/pel W/ And/or W/o  Result Date: 05/26/2019 CLINICAL DATA:  Bright red rectal bleeding since last evening. Recent EGD. History of pulmonary embolism, currently on Coumadin. EXAM: CTA ABDOMEN AND PELVIS WITHOUT AND WITH CONTRAST TECHNIQUE: Multidetector CT imaging of the abdomen and pelvis was performed using the standard protocol during bolus administration of intravenous contrast. Multiplanar reconstructed images and MIPs were obtained and reviewed to evaluate the vascular anatomy.  CONTRAST:  129mL OMNIPAQUE IOHEXOL 350 MG/ML SOLN COMPARISON:  CT abdomen pelvis-01/16/2019 FINDINGS: VASCULAR Aorta: There is no significant atherosclerotic plaque within the normal caliber abdominal aorta. No evidence of abdominal aortic dissection or  periaortic stranding. Celiac: Widely patent without a hemodynamically significant narrowing. Conventional branching pattern though note is made of a separate origin of the left hepatic artery in lieu of a proper hepatic artery. SMA: Widely patent without a hemodynamically significant narrowing. Conventional branching pattern. The distal tributaries the SMA appear widely patent without discrete intraluminal filling defect to suggest distal embolism. Renals: Solitary bilaterally; both renal arteries are widely patent without hemodynamically significant narrowing. No discrete areas of vessel irregularity to suggest FMD. IMA: Widely patent without a hemodynamically significant narrowing. Inflow: The bilateral common, external and internal iliac arteries are of normal caliber and widely patent without a hemodynamically significant narrowing. Proximal Outflow: The bilateral common and imaged portions of the bilateral deep and superficial femoral arteries are widely patent without a hemodynamically significant narrowing. Veins: The IVC and pelvic venous systems are widely patent. Review of the MIP images confirms the above findings. _________________________________________________________ NON-VASCULAR Lower chest: Limited visualization of the lower thorax demonstrates improved aeration of the right costophrenic angle with minimal residual linear heterogeneous opacities favored to represent atelectasis or scarring. Unchanged subsegmental atelectasis within the contralateral left costophrenic angle and medial segment of the right middle lobe. No new focal airspace opacities. No pleural effusion. Normal heart size.  No pericardial effusion. Hepatobiliary: Normal hepatic contour. Suspected perfusion allow abnormality involving the central aspect of the lateral segment of the left lobe of the liver without associated abnormality on the acquired portal venous phase imaging. No discrete hepatic lesions. Normal appearance of the  gallbladder given degree distention. No radiopaque gallstones. No intra extrahepatic biliary duct dilatation. No ascites. Pancreas: Normal appearance of the pancreas. Spleen: Normal appearance of the spleen. Adrenals/Urinary Tract: There is symmetric enhancement of the bilateral kidneys. No definite renal stones this postcontrast examination. No discrete renal lesions. No urine obstruction or perinephric stranding. Normal appearance the bilateral adrenal glands. Normal appearance of the urinary bladder given degree distention. Stomach/Bowel: Large colonic stool burden without evidence of enteric obstruction. Scattered colonic diverticulosis without evidence of superimposed acute diverticulitis. Normal appearance of the terminal ileum and the retrocecal appendix. No discrete areas of bowel wall thickening. Potential small hiatal hernia. No pneumoperitoneum, pneumatosis or portal venous gas. There are no discrete areas of intraluminal contrast extravasation to suggest the etiology of reported history of GI bleeding. Lymphatic: No bulky retroperitoneal, mesenteric, pelvic or inguinal lymphadenopathy. Reproductive: Post hysterectomy. No discrete adnexal lesion. No free fluid within the pelvic cul-de-sac. Other: Tiny mesenteric fat containing peri umbilical hernia. There is a minimal amount of subcutaneous edema about the midline of the low back. Ill-defined areas subcutaneous stranding within the undersurface of the abdominal pannus may represent the sequela of subcutaneous medication administration. Musculoskeletal: No acute or aggressive osseous abnormalities. Mild-to-moderate multilevel lumbar spine DDD, worse at L3-L4 with disc space height loss, endplate irregularity and sclerosis. IMPRESSION: Vascular Impression: 1. No discrete areas of intraluminal contrast extravasation to suggest etiology reported history of GI bleeding. 2. Unremarkable CTA of the abdomen pelvis. Nonvascular Impression: 1. Scattered colonic  diverticulosis without evidence superimposed acute diverticulitis. 2. Large colonic stool burden without evidence of enteric obstruction. 3. Improved aeration of the right costophrenic angle with minimal residual atelectasis/scar. Electronically Signed   By: Sandi Mariscal M.D.   On: 05/26/2019 14:49   Medications:  Scheduled Meds:  buPROPion  150 mg Oral BID   hydrocortisone  25 mg Rectal BID   levothyroxine  150 mcg Oral QAC breakfast   oxybutynin  5 mg Oral QHS   pantoprazole  40 mg Oral Daily   rosuvastatin  20 mg Oral q1800   sodium fluoride  1 application dental QHS   topiramate  50 mg Oral BID   Continuous Infusions:  sodium chloride 75 mL/hr at 05/28/19 0521   PRN Meds:.acetaminophen **OR** acetaminophen, albuterol, cyclobenzaprine, Difluprednate, diphenhydrAMINE, fluticasone, linaclotide, Melatonin, ondansetron **OR** ondansetron (ZOFRAN) IV, polyethylene glycol, polyvinyl alcohol, prednisoLONE acetate, promethazine, tiZANidine, traMADol   Assessment: GI bleed  Plan: No further episodes of active GI bleeding Source of bleed was likely patients internal hemorrhoids seen on colonoscopy yesterday Continue Anusol suppositories Continue to monitor for any signs of active GI bleeding, and please page GI if this occurs  GI service will sign off at this time.  Please page with any questions or concerns   LOS: 2 days   Angela Antigua, MD 05/28/2019, 9:30 AM

## 2019-05-28 NOTE — Plan of Care (Signed)
Patient doing well today.  She has had some abdominal pain that has been relieved some with tramadol.  No signs of bleeding.  She will plan to have a IVC filter placed tomorrow.  No significant changes.

## 2019-05-28 NOTE — Progress Notes (Signed)
[facility] [place] NAME: Angela [name]    MR#:  [id number]  DATE OF BIRTH:  August 14, 1975  SUBJECTIVE:  CHIEF COMPLAINT: Patient is resting comfortably, reporting discomfort in the throat and intermittent episodes of cough but no active bleeding EGD and colonoscopy performed on 05/27/2019  REVIEW OF SYSTEMS:  CONSTITUTIONAL: No fever, fatigue or weakness.  EYES: No blurred or double vision.  EARS, NOSE, AND THROAT: No tinnitus or ear pain.  RESPIRATORY: No cough, shortness of breath, wheezing or hemoptysis.  CARDIOVASCULAR: No chest pain, orthopnea, edema.  GASTROINTESTINAL: No nausea, vomiting, diarrhea or abdominal pain.  GENITOURINARY: No dysuria, hematuria.  ENDOCRINE: No polyuria, nocturia,  HEMATOLOGY: No anemia, easy bruising or bleeding SKIN: No rash or lesion. MUSCULOSKELETAL: No joint pain or arthritis.   NEUROLOGIC: No tingling, numbness, weakness.  PSYCHIATRY: No anxiety or depression.   DRUG ALLERGIES:   Allergies  Allergen Reactions  . Amoxicillin Hives    Did it involve swelling of the face/tongue/throat, SOB, or low BP? No Did it involve sudden or severe rash/hives, skin peeling, or any reaction on the inside of your mouth or nose? No Did you need to seek medical attention at a hospital or doctor's office? Unknown When did it last happen?5+ years If all above answers are "NO", may proceed with cephalosporin use.   Marcelline Mates     Break out in mouth  . Lisinopril Cough  . Nortriptyline     Unknown reaction   . Orange Fruit [Citrus]     Break out in mouth  . Other     Sulfa eye drops- Scratched corneas  . Penicillins Hives    Did it involve swelling of the face/tongue/throat, SOB, or low BP? No Did it involve sudden or severe rash/hives, skin peeling, or any reaction on the inside of your mouth or nose? No Did you need to seek medical attention at a hospital or doctor's office? Unknown When did it last  happen?5+ years If all above answers are "NO", may proceed with cephalosporin use.     VITALS:  Blood pressure 114/77, pulse 81, temperature 98.8 F (37.1 C), temperature source Oral, resp. rate 18, height 5\' 3"  (1.6 m), weight 136 kg, SpO2 100 %.  PHYSICAL EXAMINATION:  GENERAL:  43 y.o.-year-old patient lying in the bed with no acute distress.  EYES: Pupils equal, round, reactive to light and accommodation. No scleral icterus. Extraocular muscles intact.  HEENT: Head atraumatic, normocephalic. Oropharynx and nasopharynx clear.  NECK:  Supple, no jugular venous distention. No thyroid enlargement, no tenderness.  LUNGS: Normal breath sounds bilaterally, no wheezing, rales,rhonchi or crepitation. No use of accessory muscles of respiration.  CARDIOVASCULAR: S1, S2 normal. No murmurs, rubs, or gallops.  ABDOMEN: Soft, nontender, nondistended. Bowel sounds present EXTREMITIES: No pedal edema, cyanosis, or clubbing.  NEUROLOGIC: Cranial nerves II through XII are intact. Muscle strength 5/5 in all extremities. Sensation intact. Gait not checked.  PSYCHIATRIC: The patient is alert and oriented x 3.  SKIN: No obvious rash, lesion, or ulcer.    LABORATORY PANEL:   CBC Recent Labs  Lab 05/28/19 0519  WBC 8.5  HGB 11.2*  HCT 34.2*  PLT 279   ------------------------------------------------------------------------------------------------------------------  Chemistries  Recent Labs  Lab 05/26/19 1033 05/27/19 0519  NA 140 140  K 4.1 3.6  CL 108 106  CO2 23 22  GLUCOSE 99 95  BUN 13 10  CREATININE 0.90 1.00  CALCIUM 9.2 8.6*  AST 13*  --  ALT 13  --   ALKPHOS 44  --   BILITOT 0.7  --    ------------------------------------------------------------------------------------------------------------------  Cardiac Enzymes No results for input(s): TROPONINI in the last 168  hours. ------------------------------------------------------------------------------------------------------------------  RADIOLOGY:  Nm Gi Blood Loss  Result Date: 05/26/2019 CLINICAL DATA:  Bright red blood in bowel last. EXAM: NUCLEAR MEDICINE GASTROINTESTINAL BLEEDING SCAN TECHNIQUE: Sequential abdominal images were obtained following intravenous administration of Tc-87m labeled red blood cells. RADIOPHARMACEUTICALS:  21.2 mCi Tc-90m pertechnetate in-vitro labeled red cells. COMPARISON:  None. FINDINGS: No evidence of GI bleed. IMPRESSION: No evidence of GI bleed. Electronically Signed   By: Dorise Bullion III M.D   On: 05/26/2019 18:07   Ct Angio Abd/pel W/ And/or W/o  Result Date: 05/26/2019 CLINICAL DATA:  Bright red rectal bleeding since last evening. Recent EGD. History of pulmonary embolism, currently on Coumadin. EXAM: CTA ABDOMEN AND PELVIS WITHOUT AND WITH CONTRAST TECHNIQUE: Multidetector CT imaging of the abdomen and pelvis was performed using the standard protocol during bolus administration of intravenous contrast. Multiplanar reconstructed images and MIPs were obtained and reviewed to evaluate the vascular anatomy. CONTRAST:  144mL OMNIPAQUE IOHEXOL 350 MG/ML SOLN COMPARISON:  CT abdomen pelvis-01/16/2019 FINDINGS: VASCULAR Aorta: There is no significant atherosclerotic plaque within the normal caliber abdominal aorta. No evidence of abdominal aortic dissection or periaortic stranding. Celiac: Widely patent without a hemodynamically significant narrowing. Conventional branching pattern though note is made of a separate origin of the left hepatic artery in lieu of a proper hepatic artery. SMA: Widely patent without a hemodynamically significant narrowing. Conventional branching pattern. The distal tributaries the SMA appear widely patent without discrete intraluminal filling defect to suggest distal embolism. Renals: Solitary bilaterally; both renal arteries are widely patent without  hemodynamically significant narrowing. No discrete areas of vessel irregularity to suggest FMD. IMA: Widely patent without a hemodynamically significant narrowing. Inflow: The bilateral common, external and internal iliac arteries are of normal caliber and widely patent without a hemodynamically significant narrowing. Proximal Outflow: The bilateral common and imaged portions of the bilateral deep and superficial femoral arteries are widely patent without a hemodynamically significant narrowing. Veins: The IVC and pelvic venous systems are widely patent. Review of the MIP images confirms the above findings. _________________________________________________________ NON-VASCULAR Lower chest: Limited visualization of the lower thorax demonstrates improved aeration of the right costophrenic angle with minimal residual linear heterogeneous opacities favored to represent atelectasis or scarring. Unchanged subsegmental atelectasis within the contralateral left costophrenic angle and medial segment of the right middle lobe. No new focal airspace opacities. No pleural effusion. Normal heart size.  No pericardial effusion. Hepatobiliary: Normal hepatic contour. Suspected perfusion allow abnormality involving the central aspect of the lateral segment of the left lobe of the liver without associated abnormality on the acquired portal venous phase imaging. No discrete hepatic lesions. Normal appearance of the gallbladder given degree distention. No radiopaque gallstones. No intra extrahepatic biliary duct dilatation. No ascites. Pancreas: Normal appearance of the pancreas. Spleen: Normal appearance of the spleen. Adrenals/Urinary Tract: There is symmetric enhancement of the bilateral kidneys. No definite renal stones this postcontrast examination. No discrete renal lesions. No urine obstruction or perinephric stranding. Normal appearance the bilateral adrenal glands. Normal appearance of the urinary bladder given degree  distention. Stomach/Bowel: Large colonic stool burden without evidence of enteric obstruction. Scattered colonic diverticulosis without evidence of superimposed acute diverticulitis. Normal appearance of the terminal ileum and the retrocecal appendix. No discrete areas of bowel wall thickening. Potential small hiatal hernia. No pneumoperitoneum, pneumatosis or portal  venous gas. There are no discrete areas of intraluminal contrast extravasation to suggest the etiology of reported history of GI bleeding. Lymphatic: No bulky retroperitoneal, mesenteric, pelvic or inguinal lymphadenopathy. Reproductive: Post hysterectomy. No discrete adnexal lesion. No free fluid within the pelvic cul-de-sac. Other: Tiny mesenteric fat containing peri umbilical hernia. There is a minimal amount of subcutaneous edema about the midline of the low back. Ill-defined areas subcutaneous stranding within the undersurface of the abdominal pannus may represent the sequela of subcutaneous medication administration. Musculoskeletal: No acute or aggressive osseous abnormalities. Mild-to-moderate multilevel lumbar spine DDD, worse at L3-L4 with disc space height loss, endplate irregularity and sclerosis. IMPRESSION: Vascular Impression: 1. No discrete areas of intraluminal contrast extravasation to suggest etiology reported history of GI bleeding. 2. Unremarkable CTA of the abdomen pelvis. Nonvascular Impression: 1. Scattered colonic diverticulosis without evidence superimposed acute diverticulitis. 2. Large colonic stool burden without evidence of enteric obstruction. 3. Improved aeration of the right costophrenic angle with minimal residual atelectasis/scar. Electronically Signed   By: Sandi Mariscal M.D.   On: 05/26/2019 14:49    EKG:   Orders placed or performed during the hospital encounter of 02/06/19  . EKG    ASSESSMENT AND PLAN:   #Acute GI bleed Tagged RBC scan no active bleeding noticed 05/26/2019 Patient had colonoscopy on  05/27/2019-she has revealed nonbleeding internal hemorrhoids recommending Anusol suppository twice a day for 10 days Patient had EGD on 05/27/2019 which has revealed no acute findings.  Recommending Protonix once a day and avoid NSAIDs.  Aspirin is okay if medically indicated.  Check H. Pylori Patient is hemodynamically stable Appreciate GI recommendations CT angiogram of the abdomen pelvis with scattered diverticulosis no 90s  #Cough .  Throat lozenges, chest x-ray, incentive spirometry and antiptosis as needed  #History of pulmonary embolism x2 INR 1 in the ED Holding home Coumadin Patient is supposed to have IVC filter placed by Dr. Delana Meyer on 10/21.   consulted vascular surgery forIVC filter placement, scheduled on Monday  #Constipation CT abdomen and pelvis with a large stool burden -Continue Linzess  #Hyperlipidemia continue home medication Crestor  #Hypothyroidism continue Synthyroid  #History of pseudotumor tumor cerebri clinically stable Tylenol as needed and continue home medication Topamax  #Depression with anxiety stable continue home medication Wellbutrin     All the records are reviewed and case discussed with Care Management/Social Workerr. Management plans discussed with the patient, family and they are in agreement.  CODE STATUS: fc   TOTAL TIME TAKING CARE OF THIS PATIENT: 35  minutes.   POSSIBLE D/C IN 1-2 DAYS, DEPENDING ON CLINICAL CONDITION.  Note: This dictation was prepared with Dragon dictation along with smaller phrase technology. Any transcriptional errors that result from this process are unintentional.   Nicholes Mango M.D on 05/28/2019 at 2:27 PM  Between 7am to 6pm - Pager - [phone] After 6pm go to [email or web address] - password [email or web address] [facility]  Office  [phone]  CC: Primary care physician; Sharyne Peach, MD

## 2019-05-28 NOTE — Consult Note (Signed)
Reason for Consult:Melena with recurrent PE, IVCPlacement Referring Physician: Dr. Clayborne Artist Angela [name] is an 43 y.o. female.  HPI: Patient known to service, Dr. Delana Meyer. History of recurrent Pulmonary emboli despite anticoagulation. Difficulty to fully anticoagulate in the past. Patient with BMI 53, planning for bariatric surgery soon. Presented with melena and subtherapeutic INR on Coumadin. Patient was already scheduled for IVC filter later this week.   Past Medical History:  Diagnosis Date  . Anemia   . Anxiety   . Asthma   . Depression   . Fibromyalgia   . GERD (gastroesophageal reflux disease)   . Hypertension   . Irritable bowel syndrome (IBS)   . Pseudotumor cerebri   . Pulmonary embolism ([place])   . Raynaud's disease   . Thyroid disease    hypothyroid  . Vision disturbance     Past Surgical History:  Procedure Laterality Date  . ABDOMINAL HYSTERECTOMY  02/06/13  . BREAST BIOPSY Right October, 2014   right breast core biopsy, fibroadenomatous changes  . BREAST BIOPSY Right December 2014   FNA retroareolar nodule consistent with fibroadenoma.  Marland Kitchen BREAST BIOPSY Right 12/16/2016   Fibroadenoma in the retroareolar area at 6:00.  Marland Kitchen [place]  . COLONOSCOPY  2015   Dr Allen Norris  . RHINOPLASTY    . TONSILECTOMY, ADENOIDECTOMY, BILATERAL MYRINGOTOMY AND TUBES  2004  . UPPER GASTROINTESTINAL ENDOSCOPY  2015   Dr Allen Norris    Family History  Problem Relation Age of Onset  . Depression Mother   . Migraines Mother   . Diverticulitis Mother   . Hypertension Mother   . Heart disease Father   . Diabetes Father   . Hypertension Father   . Stroke Sister   . Cancer Maternal Aunt   . Cancer Maternal Grandmother     Social History:  reports that she has never smoked. She has never used smokeless tobacco. She reports current alcohol use. She reports that she does not use drugs.  Allergies:  Allergies  Allergen Reactions  . Amoxicillin Hives    Did it  involve swelling of the face/tongue/throat, SOB, or low BP? No Did it involve sudden or severe rash/hives, skin peeling, or any reaction on the inside of your mouth or nose? No Did you need to seek medical attention at [name] hospital or doctor's office? Unknown When did it last happen?5+ years If all above answers are "NO", may proceed with cephalosporin use.   Angela [name]     Break out in mouth  . Lisinopril Cough  . Nortriptyline     Unknown reaction   . Orange Fruit [Citrus]     Break out in mouth  . Other     Sulfa eye drops- Scratched corneas  . Penicillins Hives    Did it involve swelling of the face/tongue/throat, SOB, or low BP? No Did it involve sudden or severe rash/hives, skin peeling, or any reaction on the inside of your mouth or nose? No Did you need to seek medical attention at [name] hospital or doctor's office? Unknown When did it last happen?5+ years If all above answers are "NO", may proceed with cephalosporin use.     Medications: I have reviewed the patient's current medications.  Results for orders placed or performed during the hospital encounter of 05/26/19 (from the past 48 hour(s))  Comprehensive metabolic panel     Status: Abnormal   Collection Time: 05/26/19 10:33 AM  Result Value Ref Range   Sodium 140  135 - 145 mmol/L   Potassium 4.1 3.5 - 5.1 mmol/L   Chloride 108 98 - 111 mmol/L   CO2 23 22 - 32 mmol/L   Glucose, Bld 99 70 - 99 mg/dL   BUN 13 6 - 20 mg/dL   Creatinine, Ser 0.90 0.44 - 1.00 mg/dL   Calcium 9.2 8.9 - 10.3 mg/dL   Total Protein 7.2 6.5 - 8.1 g/dL   Albumin 3.9 3.5 - 5.0 g/dL   AST 13 (L) 15 - 41 U/L   ALT 13 0 - 44 U/L   Alkaline Phosphatase 44 38 - 126 U/L   Total Bilirubin 0.7 0.3 - 1.2 mg/dL   GFR calc non Af Amer >60 >60 mL/min   GFR calc Af Amer >60 >60 mL/min   Anion gap 9 5 - 15    Comment: Performed at [facility], [address] [place], [place] [address]  CBC     Status: None   Collection Time:  05/26/19 10:33 AM  Result Value Ref Range   WBC 9.4 4.0 - 10.5 K/uL   RBC 4.62 3.87 - 5.11 MIL/uL   Hemoglobin 13.2 12.0 - 15.0 g/dL   HCT 39.1 36.0 - 46.0 %   MCV 84.6 80.0 - 100.0 fL   MCH 28.6 26.0 - 34.0 pg   MCHC 33.8 30.0 - 36.0 g/dL   RDW 13.5 11.5 - 15.5 %   Platelets 314 150 - 400 K/uL   nRBC 0.0 0.0 - 0.2 %    Comment: Performed at [facility], [address] [place], [place] [address]  Type and screen [facility]     Status: None   Collection Time: 05/26/19 10:33 AM  Result Value Ref Range   ABO/RH(D) O POS    Antibody Screen NEG    Sample Expiration      05/29/2019,2359 Performed at [facility], [address] [place], [place] [address]   Protime-INR - (order if Patient is taking Coumadin / Warfarin)     Status: None   Collection Time: 05/26/19 10:33 AM  Result Value Ref Range   Prothrombin Time 13.1 11.4 - 15.2 seconds   INR 1.0 0.8 - 1.2    Comment: (NOTE) INR goal varies based on device and disease states. Performed at [facility], [address] [place], [place] [id number]   Basic metabolic panel     Status: Abnormal   Collection Time: 05/27/19  5:19 AM  Result Value Ref Range   Sodium 140 135 - 145 mmol/L   Potassium 3.6 3.5 - 5.1 mmol/L   Chloride 106 98 - 111 mmol/L   CO2 22 22 - 32 mmol/L   Glucose, Bld 95 70 - 99 mg/dL   BUN 10 6 - 20 mg/dL   Creatinine, Ser 1.00 0.44 - 1.00 mg/dL   Calcium 8.6 (L) 8.9 - 10.3 mg/dL   GFR calc non Af Amer >60 >60 mL/min   GFR calc Af Amer >60 >60 mL/min   Anion gap 12 5 - 15    Comment: Performed at [facility], [address] [place],  [address]  CBC     Status: None   Collection Time: 05/27/19  5:19 AM  Result Value Ref Range   WBC 9.3 4.0 - 10.5 K/uL   RBC 4.31 3.87 - 5.11 MIL/uL   Hemoglobin 12.2 12.0 - 15.0 g/dL   HCT 36.6 36.0 - 46.0 %   MCV 84.9 80.0 - 100.0  fL   MCH 28.3 26.0 - 34.0 pg   MCHC 33.3 30.0 - 36.0 g/dL   RDW 13.4  11.5 - 15.5 %   Platelets 321 150 - 400 K/uL   nRBC 0.0 0.0 - 0.2 %    Comment: Performed at [facility], [address] [place], [place] [address]  HIV Antibody (routine testing w rflx)     Status: None   Collection Time: 05/27/19  7:36 AM  Result Value Ref Range   HIV Screen 4th Generation wRfx NON REACTIVE NON REACTIVE    Comment: Performed at [facility] [address] [place], [place] [address]  Hemoglobin and hematocrit, blood     Status: Abnormal   Collection Time: 05/27/19  5:40 PM  Result Value Ref Range   Hemoglobin 11.5 (L) 12.0 - 15.0 g/dL   HCT 34.4 (L) 36.0 - 46.0 %    Comment: Performed at [facility], [address] [place], [place] [address]  CBC     Status: Abnormal   Collection Time: 05/28/19  5:19 AM  Result Value Ref Range   WBC 8.5 4.0 - 10.5 K/uL   RBC 4.00 3.87 - 5.11 MIL/uL   Hemoglobin 11.2 (L) 12.0 - 15.0 g/dL   HCT 34.2 (L) 36.0 - 46.0 %   MCV 85.5 80.0 - 100.0 fL   MCH 28.0 26.0 - 34.0 pg   MCHC 32.7 30.0 - 36.0 g/dL   RDW 13.5 11.5 - 15.5 %   Platelets 279 150 - 400 K/uL   nRBC 0.0 0.0 - 0.2 %    Comment: Performed at [facility], [place],  [address]    Nm Gi Blood Loss  Result Date: 05/26/2019 CLINICAL DATA:  Bright red blood in bowel last. EXAM: NUCLEAR MEDICINE GASTROINTESTINAL BLEEDING SCAN TECHNIQUE: Sequential abdominal images were obtained following intravenous administration of Tc-40m labeled red blood cells. RADIOPHARMACEUTICALS:  21.2 mCi Tc-90m pertechnetate in-vitro labeled red cells. COMPARISON:  None. FINDINGS: No evidence of GI bleed. IMPRESSION: No evidence of GI bleed. Electronically Signed   By: Dorise Bullion III M.D   On: 05/26/2019 18:07   Ct Angio Abd/pel W/ And/or W/o  Result Date: 05/26/2019 CLINICAL DATA:  Bright red rectal bleeding since last evening. Recent EGD. History of pulmonary embolism, currently on Coumadin. EXAM: CTA ABDOMEN AND PELVIS WITHOUT AND  WITH CONTRAST TECHNIQUE: Multidetector CT imaging of the abdomen and pelvis was performed using the standard protocol during bolus administration of intravenous contrast. Multiplanar reconstructed images and MIPs were obtained and reviewed to evaluate the vascular anatomy. CONTRAST:  159mL OMNIPAQUE IOHEXOL 350 MG/ML SOLN COMPARISON:  CT abdomen pelvis-01/16/2019 FINDINGS: VASCULAR Aorta: There is no significant atherosclerotic plaque within the normal caliber abdominal aorta. No evidence of abdominal aortic dissection or periaortic stranding. Celiac: Widely patent without [name] hemodynamically significant narrowing. Conventional branching pattern though note is made of [name] separate origin of the left hepatic artery in lieu of [name] proper hepatic artery. SMA: Widely patent without [name] hemodynamically significant narrowing. Conventional branching pattern. The distal tributaries the SMA appear widely patent without discrete intraluminal filling defect to suggest distal embolism. Renals: Solitary bilaterally; both renal arteries are widely patent without hemodynamically significant narrowing. No discrete areas of vessel irregularity to suggest FMD. IMA: Widely patent without [name] hemodynamically significant narrowing. Inflow: The bilateral common, external and internal iliac arteries are of normal caliber and widely patent without [name] hemodynamically significant narrowing. Proximal Outflow: The bilateral common and imaged  portions of the bilateral deep and superficial femoral arteries are widely patent without [name] hemodynamically significant narrowing. Veins: The IVC and pelvic venous systems are widely patent. Review of the MIP images confirms the above findings. _________________________________________________________ NON-VASCULAR Lower chest: Limited visualization of the lower thorax demonstrates improved aeration of the right costophrenic angle with minimal residual linear heterogeneous opacities favored to represent atelectasis or  scarring. Unchanged subsegmental atelectasis within the contralateral left costophrenic angle and medial segment of the right middle lobe. No new focal airspace opacities. No pleural effusion. Normal heart size.  No pericardial effusion. Hepatobiliary: Normal hepatic contour. Suspected perfusion allow abnormality involving the central aspect of the lateral segment of the left lobe of the liver without associated abnormality on the acquired portal venous phase imaging. No discrete hepatic lesions. Normal appearance of the gallbladder given degree distention. No radiopaque gallstones. No intra extrahepatic biliary duct dilatation. No ascites. Pancreas: Normal appearance of the pancreas. Spleen: Normal appearance of the spleen. Adrenals/Urinary Tract: There is symmetric enhancement of the bilateral kidneys. No definite renal stones this postcontrast examination. No discrete renal lesions. No urine obstruction or perinephric stranding. Normal appearance the bilateral adrenal glands. Normal appearance of the urinary bladder given degree distention. Stomach/Bowel: Large colonic stool burden without evidence of enteric obstruction. Scattered colonic diverticulosis without evidence of superimposed acute diverticulitis. Normal appearance of the terminal ileum and the retrocecal appendix. No discrete areas of bowel wall thickening. Potential small hiatal hernia. No pneumoperitoneum, pneumatosis or portal venous gas. There are no discrete areas of intraluminal contrast extravasation to suggest the etiology of reported history of GI bleeding. Lymphatic: No bulky retroperitoneal, mesenteric, pelvic or inguinal lymphadenopathy. Reproductive: Post hysterectomy. No discrete adnexal lesion. No free fluid within the pelvic cul-de-sac. Other: Tiny mesenteric fat containing peri umbilical hernia. There is [name] minimal amount of subcutaneous edema about the midline of the low back. Ill-defined areas subcutaneous stranding within the  undersurface of the abdominal pannus may represent the sequela of subcutaneous medication administration. Musculoskeletal: No acute or aggressive osseous abnormalities. Mild-to-moderate multilevel lumbar spine DDD, worse at L3-L4 with disc space height loss, endplate irregularity and sclerosis. IMPRESSION: Vascular Impression: 1. No discrete areas of intraluminal contrast extravasation to suggest etiology reported history of GI bleeding. 2. Unremarkable CTA of the abdomen pelvis. Nonvascular Impression: 1. Scattered colonic diverticulosis without evidence superimposed acute diverticulitis. 2. Large colonic stool burden without evidence of enteric obstruction. 3. Improved aeration of the right costophrenic angle with minimal residual atelectasis/scar. Electronically Signed   By: Sandi Mariscal M.D.   On: 05/26/2019 14:49    Review of Systems  Constitutional: Positive for malaise/fatigue. Negative for fever.  HENT: Negative.   Eyes: Negative.   Respiratory: Negative for cough and hemoptysis.   Cardiovascular: Positive for orthopnea and leg swelling. Negative for palpitations and claudication.  Gastrointestinal: Positive for melena.  Genitourinary: Negative.   Skin: Negative.   Neurological: Negative.   Psychiatric/Behavioral: Negative.    Blood pressure 114/77, pulse 81, temperature 98.8 F (37.1 C), temperature source Oral, resp. rate 18, height 5\' 3"  (1.6 m), weight 136 kg, SpO2 100 %. Physical Exam  Nursing note and vitals reviewed. Constitutional: She is oriented to person, place, and time. She appears well-developed and well-nourished.  HENT:  Head: Normocephalic.  Neck: Neck supple.  Cardiovascular: Normal rate and regular rhythm.  Respiratory: Effort normal and breath sounds normal. No respiratory distress.  GI: Soft. Bowel sounds are normal. There is no abdominal tenderness. There is no rebound and no guarding.  Musculoskeletal: Normal range of motion.        General: Edema present. No  tenderness.  Neurological: She is alert and oriented to person, place, and time.  Skin: Skin is warm and dry.    Assessment/Plan: Recurrent Pulmonary Emboli Failure with anticoagulation Now with Melena  Patient scheduled for IVC on Wednesday. However, now with melena and previous failures to anticoagulate adequately, will plan for IVC filter placement tomorrow.  Angela [name], Angela [name] 05/28/2019, 9:15 AM

## 2019-05-29 ENCOUNTER — Encounter: Payer: Self-pay | Admitting: Gastroenterology

## 2019-05-29 ENCOUNTER — Encounter
Admission: EM | Disposition: A | Discharge status: Home / Self Care | Payer: Self-pay | Source: Home / Self Care | Attending: Internal Medicine

## 2019-05-29 ENCOUNTER — Other Ambulatory Visit (INDEPENDENT_AMBULATORY_CARE_PROVIDER_SITE_OTHER): Payer: Self-pay | Admitting: Vascular Surgery

## 2019-05-29 DIAGNOSIS — I2699 Other pulmonary embolism without acute cor pulmonale: Secondary | ICD-10-CM

## 2019-05-29 HISTORY — PX: IVC FILTER INSERTION: CATH118245

## 2019-05-29 LAB — BASIC METABOLIC PANEL
Anion gap: 4 — ABNORMAL LOW (ref 5–15)
BUN: 10 mg/dL (ref 6–20)
CO2: 21 mmol/L — ABNORMAL LOW (ref 22–32)
Calcium: 8.3 mg/dL — ABNORMAL LOW (ref 8.9–10.3)
Chloride: 115 mmol/L — ABNORMAL HIGH (ref 98–111)
Creatinine, Ser: 0.89 mg/dL (ref 0.44–1.00)
GFR calc Af Amer: >60 mL/min (ref >60)
GFR calc non Af Amer: >60 mL/min (ref >60)
Glucose, Bld: 91 mg/dL (ref 70–99)
Potassium: 3.7 mmol/L (ref 3.5–5.1)
Sodium: 140 mmol/L (ref 135–145)

## 2019-05-29 LAB — CBC
HCT: 34.8 % — ABNORMAL LOW (ref 36.0–46.0)
Hemoglobin: 11.4 g/dL — ABNORMAL LOW (ref 12.0–15.0)
MCH: 28 pg (ref 26.0–34.0)
MCHC: 32.8 g/dL (ref 30.0–36.0)
MCV: 85.5 fL (ref 80.0–100.0)
Platelets: 288 10*3/uL (ref 150–400)
RBC: 4.07 MIL/uL (ref 3.87–5.11)
RDW: 13.4 % (ref 11.5–15.5)
WBC: 8.4 10*3/uL (ref 4.0–10.5)
nRBC: 0 % (ref 0.0–0.2)

## 2019-05-29 LAB — H. PYLORI BREATH TEST: H. pylori UBiT: NEGATIVE

## 2019-05-29 SURGERY — IVC FILTER INSERTION
Anesthesia: Moderate Sedation

## 2019-05-29 MED ORDER — HYDROMORPHONE HCL 1 MG/ML IJ SOLN: 1.0000 mg | Freq: Once | INTRAMUSCULAR | Status: DC | PRN

## 2019-05-29 MED ORDER — MIDAZOLAM HCL 2 MG/ML PO SYRP: 8.0000 mg | ORAL_SOLUTION | Freq: Once | ORAL | Status: DC | PRN

## 2019-05-29 MED ORDER — SODIUM CHLORIDE 0.9 % IV SOLN: INTRAVENOUS | Status: DC

## 2019-05-29 MED ORDER — BISACODYL 10 MG RE SUPP: 10.0000 mg | Freq: Every day | RECTAL | Status: DC | PRN

## 2019-05-29 MED ORDER — FLEET ENEMA 7-19 GM/118ML RE ENEM: 1.0000 | ENEMA | Freq: Every day | RECTAL | Status: DC | PRN

## 2019-05-29 MED ORDER — CLINDAMYCIN PHOSPHATE 300 MG/50ML IV SOLN
INTRAVENOUS | Status: AC
  Administered 2019-05-29: 300 mg via INTRAVENOUS
  Filled 2019-05-29: qty 50

## 2019-05-29 MED ORDER — FAMOTIDINE 20 MG PO TABS: 40.0000 mg | ORAL_TABLET | Freq: Once | ORAL | Status: DC | PRN

## 2019-05-29 MED ORDER — METHYLPREDNISOLONE SODIUM SUCC 125 MG IJ SOLR: 125.0000 mg | Freq: Once | INTRAMUSCULAR | Status: DC | PRN

## 2019-05-29 MED ORDER — ONDANSETRON HCL 4 MG/2ML IJ SOLN: 4.0000 mg | Freq: Four times a day (QID) | INTRAMUSCULAR | Status: DC | PRN

## 2019-05-29 MED ORDER — IODIXANOL 320 MG/ML IV SOLN
INTRAVENOUS | Status: DC | PRN
  Administered 2019-05-29: 15 mL via INTRAVENOUS

## 2019-05-29 MED ORDER — DIPHENHYDRAMINE HCL 50 MG/ML IJ SOLN: 50.0000 mg | Freq: Once | INTRAMUSCULAR | Status: DC | PRN

## 2019-05-29 MED ORDER — FENTANYL CITRATE (PF) 100 MCG/2ML IJ SOLN
INTRAMUSCULAR | Status: AC
  Administered 2019-05-29: 13:00:00
  Filled 2019-05-29: qty 2

## 2019-05-29 MED ORDER — FENTANYL CITRATE (PF) 100 MCG/2ML IJ SOLN
INTRAMUSCULAR | Status: DC | PRN
  Administered 2019-05-29 (×2): 50 ug via INTRAVENOUS

## 2019-05-29 MED ORDER — MIDAZOLAM HCL 5 MG/5ML IJ SOLN
INTRAMUSCULAR | Status: AC
  Administered 2019-05-29: 13:00:00
  Filled 2019-05-29: qty 5

## 2019-05-29 MED ORDER — MIDAZOLAM HCL 2 MG/2ML IJ SOLN
INTRAMUSCULAR | Status: DC | PRN
  Administered 2019-05-29: 2 mg via INTRAVENOUS
  Administered 2019-05-29: 1 mg via INTRAVENOUS

## 2019-05-29 MED ORDER — CLINDAMYCIN PHOSPHATE 300 MG/50ML IV SOLN
300.0000 mg | Freq: Once | INTRAVENOUS | Status: AC
  Administered 2019-05-29: 13:00:00 300 mg via INTRAVENOUS

## 2019-05-29 SURGICAL SUPPLY — 4 items
COVER PROBE U/S 5X48 (MISCELLANEOUS) ×2 IMPLANT
KIT FEMORAL DEL DENALI (Miscellaneous) ×2 IMPLANT
PACK ANGIOGRAPHY (CUSTOM PROCEDURE TRAY) ×2 IMPLANT
WIRE J 3MM .035X145CM (WIRE) ×2 IMPLANT

## 2019-05-29 NOTE — Progress Notes (Signed)
[facility] NAME: Angela [name]    MR#:  [id number]  DATE OF BIRTH:  06/19/1976  SUBJECTIVE:  CHIEF COMPLAINT: Patient is resting comfortably, denies any new bowel movement since colonoscopy abdominal pain is improving feels better awaiting for IVC filter placement today EGD and colonoscopy performed on 05/27/2019  REVIEW OF SYSTEMS:  CONSTITUTIONAL: No fever, fatigue or weakness.  EYES: No blurred or double vision.  EARS, NOSE, AND THROAT: No tinnitus or ear pain.  RESPIRATORY: No cough, shortness of breath, wheezing or hemoptysis.  CARDIOVASCULAR: No chest pain, orthopnea, edema.  GASTROINTESTINAL: No nausea, vomiting, diarrhea or abdominal pain.  GENITOURINARY: No dysuria, hematuria.  ENDOCRINE: No polyuria, nocturia,  HEMATOLOGY: No anemia, easy bruising or bleeding SKIN: No rash or lesion. MUSCULOSKELETAL: No joint pain or arthritis.   NEUROLOGIC: No tingling, numbness, weakness.  PSYCHIATRY: No anxiety or depression.   DRUG ALLERGIES:   Allergies  Allergen Reactions  . Amoxicillin Hives    Did it involve swelling of the face/tongue/throat, SOB, or low BP? No Did it involve sudden or severe rash/hives, skin peeling, or any reaction on the inside of your mouth or nose? No Did you need to seek medical attention at a hospital or doctor's office? Unknown When did it last happen?5+ years If all above answers are "NO", may proceed with cephalosporin use.   Marcelline Mates     Break out in mouth  . Lisinopril Cough  . Nortriptyline     Unknown reaction   . Orange Fruit [Citrus]     Break out in mouth  . Other     Sulfa eye drops- Scratched corneas  . Penicillins Hives    Did it involve swelling of the face/tongue/throat, SOB, or low BP? No Did it involve sudden or severe rash/hives, skin peeling, or any reaction on the inside of your mouth or nose? No Did you need to seek medical attention at a hospital or  doctor's office? Unknown When did it last happen?5+ years If all above answers are "NO", may proceed with cephalosporin use.     VITALS:  Blood pressure 125/68, pulse 73, temperature 98 F (36.7 C), temperature source Oral, resp. rate 19, height 5\' 3"  (1.6 m), weight 136 kg, SpO2 100 %.  PHYSICAL EXAMINATION:  GENERAL:  43 y.o.-year-old patient lying in the bed with no acute distress.  EYES: Pupils equal, round, reactive to light and accommodation. No scleral icterus. Extraocular muscles intact.  HEENT: Head atraumatic, normocephalic. Oropharynx and nasopharynx clear.  NECK:  Supple, no jugular venous distention. No thyroid enlargement, no tenderness.  LUNGS: Normal breath sounds bilaterally, no wheezing, rales,rhonchi or crepitation. No use of accessory muscles of respiration.  CARDIOVASCULAR: S1, S2 normal. No murmurs, rubs, or gallops.  ABDOMEN: Soft, nontender, nondistended. Bowel sounds present EXTREMITIES: No pedal edema, cyanosis, or clubbing.  NEUROLOGIC: Cranial nerves II through XII are intact. Muscle strength 5/5 in all extremities. Sensation intact. Gait not checked.  PSYCHIATRIC: The patient is alert and oriented x 3.  SKIN: No obvious rash, lesion, or ulcer.    LABORATORY PANEL:   CBC Recent Labs  Lab 05/29/19 0504  WBC 8.4  HGB 11.4*  HCT 34.8*  PLT 288   ------------------------------------------------------------------------------------------------------------------  Chemistries  Recent Labs  Lab 05/26/19 1033  05/29/19 0504  NA 140   < > 140  K 4.1   < > 3.7  CL 108   < > 115*  CO2 23   < >  21*  GLUCOSE 99   < > 91  BUN 13   < > 10  CREATININE 0.90   < > 0.89  CALCIUM 9.2   < > 8.3*  AST 13*  --   --   ALT 13  --   --   ALKPHOS 44  --   --   BILITOT 0.7  --   --    < > = values in this interval not displayed.   ------------------------------------------------------------------------------------------------------------------  Cardiac  Enzymes No results for input(s): TROPONINI in the last 168 hours. ------------------------------------------------------------------------------------------------------------------  RADIOLOGY:  Dg Chest Port 1 View  Result Date: 05/28/2019 CLINICAL DATA:  43 year old female with history of cough. EXAM: PORTABLE CHEST 1 VIEW COMPARISON:  Chest x-ray 02/06/2019. FINDINGS: Lung volumes are normal. No consolidative airspace disease. No pleural effusions. No pneumothorax. No pulmonary nodule or mass noted. Pulmonary vasculature and the cardiomediastinal silhouette are within normal limits. IMPRESSION: No radiographic evidence of acute cardiopulmonary disease. Electronically Signed   By: Vinnie Langton M.D.   On: 05/28/2019 16:33    EKG:   Orders placed or performed during the hospital encounter of 02/06/19  . EKG    ASSESSMENT AND PLAN:   #Acute GI bleed Tagged RBC scan no active bleeding noticed 05/26/2019 Patient had colonoscopy on 05/27/2019-she has revealed nonbleeding internal hemorrhoids recommending Anusol suppository twice a day for 10 days Patient had EGD on 05/27/2019 which has revealed no acute findings.  Recommending Protonix once a day and avoid NSAIDs.  Aspirin is okay if medically indicated.  Check H. Pylori Patient is hemodynamically stable Appreciate GI recommendations, GI signed off CT angiogram of the abdomen pelvis with scattered diverticulosis no 90s  #Cough .  Throat lozenges as needed, chest x-ray is negative, incentive spirometry and antiptosis as needed Clinically feeling better  #History of pulmonary embolism x2 INR 1 in the ED Holding home Coumadin Patient is supposed to have IVC filter placed by Dr. Delana Meyer on 10/21.   consulted vascular surgery forIVC filter placement, scheduled for today  #Constipation CT abdomen and pelvis with a large stool burden -Continue Linzess -No bowel movement since colonoscopy will give her laxatives and  suppositories  #Hyperlipidemia continue home medication Crestor  #Hypothyroidism continue Synthyroid  #History of pseudotumor tumor cerebri clinically stable Tylenol as needed and continue home medication Topamax  #Depression with anxiety stable continue home medication Wellbutrin     All the records are reviewed and case discussed with Care Management/Social Workerr. Management plans discussed with the patient, family and they are in agreement.  CODE STATUS: fc   TOTAL TIME TAKING CARE OF THIS PATIENT: 35  minutes.   POSSIBLE D/C IN 1-2 DAYS, DEPENDING ON CLINICAL CONDITION.  Note: This dictation was prepared with Dragon dictation along with smaller phrase technology. Any transcriptional errors that result from this process are unintentional.   Nicholes Mango M.D on 05/29/2019 at 3:21 PM  Between 7am to 6pm - Pager - [phone] After 6pm go to [email or web address] - password [email or web address] [facility]  Office  [phone]  CC: Primary care physician; Sharyne Peach, MD

## 2019-05-29 NOTE — H&P (Signed)
[facility] History & Physical Update  The patient was interviewed and re-examined.  The patient's previous History and Physical has been reviewed and is unchanged.  There is no change in the plan of care. We plan to proceed with the scheduled procedure.  Leotis Pain, MD  05/29/2019, 10:45 AM

## 2019-05-29 NOTE — Op Note (Signed)
[facility]   OPERATIVE NOTE    PRE-OPERATIVE DIAGNOSIS: Recurrent PEs despite anticoagulation, upcoming bariatric surgery, GI bleed  POST-OPERATIVE DIAGNOSIS: same as above  PROCEDURE: 1.   Ultrasound guidance for vascular access to the right femoral vein 2.   Catheter placement into the inferior vena cava 3.   Inferior venacavogram 4.   Placement of a Bard Denali IVC filter  SURGEON: Leotis Pain, MD  ASSISTANT(S): None  ANESTHESIA: local with Moderate Conscious Sedation for approximately 15 minutes using 3 mg of Versed and 100 mcg of Fentanyl  ESTIMATED BLOOD LOSS: minimal  CONTRAST: 15 cc  FLUORO TIME: 1.3 minutes  FINDING(S): 1.  Patent IVC  SPECIMEN(S):  none  INDICATIONS:   Angela [name] is a 43 y.o. female who presents with GI bleeding requiring cessation of anticoagulation.  She has a history of multiple PEs despite anticoagulation.  She was already planned to get an IVC filter before her upcoming bariatric surgery, but with her GI bleed this needed to be moved up.  Inferior vena cava filter is indicated for this reason.  Risks and benefits including filter thrombosis, migration, fracture, bleeding, and infection were all discussed.  We discussed that all IVC filters that we place can be removed if desired from the patient once the need for the filter has passed.    DESCRIPTION: After obtaining full informed written consent, the patient was brought back to the vascular suite. The skin was sterilely prepped and draped in a sterile surgical field was created. Moderate conscious sedation was administered during a face to face encounter with the patient throughout the procedure with my supervision of the RN administering medicines and monitoring the patient's vital signs, pulse oximetry, telemetry and mental status throughout from the start of the procedure until the patient was taken to the recovery room. The right femoral vein was accessed under  direct ultrasound guidance without difficulty with a Seldinger needle and a J-wire was then placed. After skin nick and dilatation, the delivery sheath was placed into the inferior vena cava and an inferior venacavogram was performed. This demonstrated a patent IVC with the level of the renal veins at the top of L1.  The filter was then deployed into the inferior vena cava at the level of L1 just below the renal veins. The delivery sheath was then removed. Pressure was held. Sterile dressings were placed. The patient tolerated the procedure well and was taken to the recovery room in stable condition.  COMPLICATIONS: None  CONDITION: Stable  Leotis Pain  05/29/2019, 12:50 PM   This note was created with Dragon Medical transcription system. Any errors in dictation are purely unintentional.

## 2019-05-29 NOTE — Care Management Important Message (Signed)
Important Message  Patient Details  Name: Donika Lipsett MRN: [id number] Date of Birth: 1975/12/18   Medicare Important Message Given:  Yes  Initial signed by Patient Access Associate on 05/29/2019 at 9:21am.   Dannette Barbara 05/29/2019, 10:57 AM

## 2019-05-30 ENCOUNTER — Other Ambulatory Visit (INDEPENDENT_AMBULATORY_CARE_PROVIDER_SITE_OTHER): Payer: Self-pay | Admitting: Nurse Practitioner

## 2019-05-30 LAB — PROTIME-INR
INR: 1.1 (ref 0.8–1.2)
Prothrombin Time: 13.6 seconds (ref 11.4–15.2)

## 2019-05-30 LAB — CBC
HCT: 34.5 % — ABNORMAL LOW (ref 36.0–46.0)
Hemoglobin: 11.2 g/dL — ABNORMAL LOW (ref 12.0–15.0)
MCH: 28.4 pg (ref 26.0–34.0)
MCHC: 32.5 g/dL (ref 30.0–36.0)
MCV: 87.3 fL (ref 80.0–100.0)
Platelets: 295 10*3/uL (ref 150–400)
RBC: 3.95 MIL/uL (ref 3.87–5.11)
RDW: 13.7 % (ref 11.5–15.5)
WBC: 8.1 10*3/uL (ref 4.0–10.5)
nRBC: 0 % (ref 0.0–0.2)

## 2019-05-30 MED ORDER — WARFARIN SODIUM 7.5 MG PO TABS: 7.5000 mg | ORAL_TABLET | Freq: Every day | ORAL | 0 refills | Status: DC

## 2019-05-30 MED ORDER — HYDROCORTISONE ACETATE 25 MG RE SUPP: 25.0000 mg | Freq: Two times a day (BID) | RECTAL | 0 refills | Status: AC

## 2019-05-30 MED ORDER — ENOXAPARIN SODIUM 150 MG/ML ~~LOC~~ SOLN: 1.0000 mg/kg | Freq: Two times a day (BID) | SUBCUTANEOUS | 0 refills | Status: DC

## 2019-05-30 MED ORDER — ENOXAPARIN SODIUM 150 MG/ML ~~LOC~~ SOLN
1.0000 mg/kg | Freq: Two times a day (BID) | SUBCUTANEOUS | Status: DC
  Administered 2019-05-30: 135 mg via SUBCUTANEOUS
  Filled 2019-05-30: qty 0.9

## 2019-05-30 MED ORDER — POLYETHYLENE GLYCOL 3350 17 G PO PACK: 17.0000 g | PACK | Freq: Every day | ORAL | 0 refills | Status: DC | PRN

## 2019-05-30 MED ORDER — WARFARIN - PHARMACIST DOSING INPATIENT: Freq: Every day | Status: DC

## 2019-05-30 MED ORDER — FLEET ENEMA 7-19 GM/118ML RE ENEM
1.0000 | ENEMA | Freq: Once | RECTAL | Status: AC
  Administered 2019-05-30: 1 via RECTAL

## 2019-05-30 MED ORDER — TRAMADOL HCL 50 MG PO TABS: 50.0000 mg | ORAL_TABLET | Freq: Four times a day (QID) | ORAL | 0 refills | Status: AC | PRN

## 2019-05-30 MED ORDER — WARFARIN SODIUM 7.5 MG PO TABS
7.5000 mg | ORAL_TABLET | Freq: Once | ORAL | Status: DC
  Filled 2019-05-30: qty 1

## 2019-05-30 NOTE — Discharge Instructions (Addendum)
Follow-up with primary care physician in 3 days Follow-up with vascular surgery Dr. Lucky Cowboy  as recommended Follow-up with Dr. Mike Gip hematologist in 1 week Follow-up with gastroenterology Dr. Bonna Gains IN 2 weeks      Vascular Surgery Discharge Instructions 1) You may shower. Keep groins clean and dry.

## 2019-05-30 NOTE — Progress Notes (Signed)
[facility] Daily Progress Note  Subjective: 1 Day Post-Op: 1.   Ultrasound guidance for vascular access to the right femoral vein 2.   Catheter placement into the inferior vena cava 3.   Inferior venacavogram 4.   Placement of a [employer] IVC filter  Patient without complaint this AM. No issues overnight.   Objective: Vitals:   05/29/19 1315 05/29/19 1444 05/29/19 2031 05/30/19 0523  BP: 116/74 125/68 93/67 118/80  Pulse: 72 73 75 78  Resp: (!) 27 19 20 20   Temp:  98 F (36.7 C) 98.3 F (36.8 C) 98.2 F (36.8 C)  TempSrc:  Oral Oral Oral  SpO2: 98% 100% 99% 99%  Weight:      Height:        Intake/Output Summary (Last 24 hours) at 05/30/2019 1005 Last data filed at 05/30/2019 0523 Gross per 24 hour  Intake 1071.34 ml  Output 1900 ml  Net -828.66 ml   Physical Exam: A&Ox3, NAD CV: RRR Pulmonary: CTA Bilaterally Abdomen: Soft, Nontender, Nondistended Right Groin:  Access Site: Dressing clean and dry. No swelling or drainage. Vascular:   Bilateral Lower Extremity: soft, non-tender, minimal edema   Laboratory: CBC    Component Value Date/Time   WBC 8.1 05/30/2019 0909   HGB 11.2 (L) 05/30/2019 0909   HGB 11.8 (L) 01/25/2013 1053   HCT 34.5 (L) 05/30/2019 0909   HCT 31.7 (L) 02/07/2013 0901   PLT 295 05/30/2019 0909   PLT 389 03/22/2013 0724   BMET    Component Value Date/Time   NA 140 05/29/2019 0504   NA 140 02/07/2013 0901   K 3.7 05/29/2019 0504   K 3.7 02/07/2013 0901   CL 115 (H) 05/29/2019 0504   CL 110 (H) 02/07/2013 0901   CO2 21 (L) 05/29/2019 0504   CO2 25 02/07/2013 0901   GLUCOSE 91 05/29/2019 0504   GLUCOSE 125 (H) 02/07/2013 0901   BUN 10 05/29/2019 0504   BUN 7 02/07/2013 0901   CREATININE 0.89 05/29/2019 0504   CREATININE 1.01 02/07/2013 0901   CALCIUM 8.3 (L) 05/29/2019 0504   CALCIUM 8.7 02/07/2013 0901   GFRNONAA >60 05/29/2019 0504   GFRNONAA >60 02/07/2013 0901   GFRAA >60 05/29/2019 0504   GFRAA  >60 02/07/2013 0901   Assessment/Planning: The patient is a 43 year old female with a 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 2) We had a long discussion in regard to removal of the IVC filter.  She knows to call our office approximately one month after surgery to discuss removal. I will also place this in the discharge section of her chart.  She understands the long-term risks (such as inability to remove the IVC filter) the longer the filter is not removed.    Discussed with Dr. Ellis Parents Rustin Erhart PA-C 05/30/2019 10:05 AM

## 2019-05-30 NOTE — Consult Note (Addendum)
ANTICOAGULATION CONSULT NOTE - Initial Consult  Pharmacy Consult for Warfarin Dosing and Monitoring                                         Enoxaparin Bridging to therapeutic INR Indication: pulmonary embolus  Allergies  Allergen Reactions  . Amoxicillin Hives    Did it involve swelling of the face/tongue/throat, SOB, or low BP? No Did it involve sudden or severe rash/hives, skin peeling, or any reaction on the inside of your mouth or nose? No Did you need to seek medical attention at a hospital or doctor's office? Unknown When did it last happen?5+ years If all above answers are "NO", may proceed with cephalosporin use.   Angela [name]     Break out in mouth  . Lisinopril Cough  . Nortriptyline     Unknown reaction   . Orange Fruit [Citrus]     Break out in mouth  . Other     Sulfa eye drops- Scratched corneas  . Penicillins Hives    Did it involve swelling of the face/tongue/throat, SOB, or low BP? No Did it involve sudden or severe rash/hives, skin peeling, or any reaction on the inside of your mouth or nose? No Did you need to seek medical attention at a hospital or doctor's office? Unknown When did it last happen?5+ years If all above answers are "NO", may proceed with cephalosporin use.     Patient Measurements: Height: 5\' 3"  (160 cm) Weight: 299 lb 13.2 oz (136 kg) IBW/kg (Calculated) : 52.4  Vital Signs: Temp: 98.2 F (36.8 C) (10/20 0523) Temp Source: Oral (10/20 0523) BP: 118/80 (10/20 0523) Pulse Rate: 78 (10/20 0523)  Labs: Recent Labs    05/28/19 0519 05/29/19 0504 05/30/19 0909  HGB 11.2* 11.4* 11.2*  HCT 34.2* 34.8* 34.5*  PLT 279 288 295  LABPROT  --   --  13.6  INR  --   --  1.1  CREATININE  --  0.89  --     Estimated Creatinine Clearance: 109.3 mL/min (by C-G formula based on SCr of 0.89 mg/dL).   Medical History: Past Medical History:  Diagnosis Date  . Anemia   . Anxiety   . Asthma   . Depression   . Fibromyalgia   .  GERD (gastroesophageal reflux disease)   . Hypertension   . Irritable bowel syndrome (IBS)   . Pseudotumor cerebri   . Pulmonary embolism ([place])   . Raynaud's disease   . Thyroid disease    hypothyroid  . Vision disturbance     Assessment: Pharmacy consulted for warfarin and enoxaparin dosing and monitoring for 43 yo female with PMH of PE x 2. Patient admitted  With rectal bleeding on 10/16, INR was subtherapeutic at that time. Hgb was stable.  Patient now S/P  Colonoscopy, EDG, and  IVC filter placement. Source of bleed was likely patients internal hemorrhoids.   Home regimen: Warfarin 5mg   Tues, Wed, Fri                             Warfarin 7.5mg  Sun, Mon, Thurs, Sat  DATE INR DOSE 10/20  1.1  Goal of Therapy:  INR 2-3 Monitor platelets by anticoagulation protocol: Yes   Plan:  10/20  Will order Warfarin 7.5mg  PO x 1 dose this evening.  Will start Enoxaparin  1mg /kg (~135mg ) every 12 hours until INR therapeutic x2.   INR and CBC ordered with AM labs.   Pharmacy will continue to monitor and adjust per consult.   Pernell Dupre, PharmD, BCPS Clinical Pharmacist 05/30/2019 11:34 AM

## 2019-05-30 NOTE — Discharge Summary (Signed)
[facility] NAME: Angela [name]    MR#:  [id number]  DATE OF BIRTH:  August 21, 1975  DATE OF ADMISSION:  05/26/2019 ADMITTING PHYSICIAN: Sela Hua, MD  DATE OF DISCHARGE: 05/30/19  PRIMARY CARE PHYSICIAN: Sharyne Peach, MD    ADMISSION DIAGNOSIS:  Diverticulosis [K57.90] Lower GI bleed [K92.2] Anticoagulated [Z79.01]  DISCHARGE DIAGNOSIS:  Active Problems:   GI bleed   Stomach irritation   Melena   Hematochezia   Internal hemorrhoids   Diverticulosis of large intestine without diverticulitis   SECONDARY DIAGNOSIS:   Past Medical History:  Diagnosis Date  . Anemia   . Anxiety   . Asthma   . Depression   . Fibromyalgia   . GERD (gastroesophageal reflux disease)   . Hypertension   . Irritable bowel syndrome (IBS)   . Pseudotumor cerebri   . Pulmonary embolism ([place])   . Raynaud's disease   . Thyroid disease    hypothyroid  . Vision disturbance     HOSPITAL COURSE:   Angela [name]  is a 43 y.o. female with a known history of hypertension, IBS, history PE on Coumadin, depression, anxiety, asthma, hypothyroidism, pseudotumor cerebri who presented to the ED with bloody stool. Patient just had an EGD on 05/24/19 for a preop evaluation for bariatric surgery. A single gastric biopsy was performed during the endoscopy.  Patient had no immediate complications.  Yesterday, she had 2 bloody bowel movements.  She states there is blood in the toilet and on the toilet paper.  She also endorses some abdominal pain across her entire upper abdomen, that radiates down both sides.  The pain is sharp.  She denies any fevers or chills.  No nausea or vomiting.  She endorses both constipation and diarrhea due to her history of IBS.  In the ED, she was mildly hypertensive.  Labs were unremarkable.  CTA abdomen/pelvis showed scattered diverticulosis, large stool burden.  Bleeding scan was ordered and was pending.  Hospitalists  were called for admission.  #Acute GI bleed Tagged RBC scan no active bleeding noticed 05/26/2019 Patient had colonoscopy on 05/27/2019-she has revealed nonbleeding internal hemorrhoids recommending Anusol suppository twice a day for 10 days Patient had EGD on 05/27/2019 which has revealed no acute findings.  Recommending Protonix once a day and avoid NSAIDs.  Aspirin is okay if medically indicated.  Check H. Pylori OP Patient is hemodynamically stable Appreciate GI recommendations, GI signed off CT angiogram of the abdomen pelvis with scattered diverticulosis no 90s Patient is hemodynamically stable Okay to discharge patient from GI standpoint with Coumadin and Lovenox therapeutic dose for bridging until INR is therapeutic  #Cough .  Throat lozenges as needed, chest x-ray is negative, incentive spirometry and antiptosis as needed Clinically feeling better  #History of pulmonary embolism x2 INR 1 in the ED Held home Coumadin Patient is supposed to have IVC filter placed by Dr. Delana Meyer on 10/21.   consulted vascular surgery forIVC filter placement, placed IVC filter on 05/29/2019.  Patient tolerated procedure well Discussed with patient's primary hematologist Dr. Mike Gip as patient is at high risk for pulmonary embolism plan is to resume her Coumadin and bridge her with a therapeutic dose of Lovenox.  Patient is agreeable.  We will arrange home health RN to repeat PT/INR in 06/01/19, results need to be faxed over to primary care physician for further management of Coumadin  #Constipation CT abdomen and pelvis with a large stool burden -Continue Linzess -No  bowel movement since colonoscopy will give her laxatives and suppositories  #Hyperlipidemia continue home medication Crestor  #Hypothyroidism continue Synthyroid  #History of pseudotumor tumor cerebri clinically stable Tylenol as needed and continue home medication Topamax  #Depression with anxiety stable continue home  medication Wellbutrin   DISCHARGE CONDITIONS:   FAIR  CONSULTS OBTAINED:  Treatment Team:  Evaristo Bury, MD   PROCEDURES  colonoscopy on 05/27/2019- has revealed nonbleeding internal hemorrhoids recommending Anusol suppository twice a day for 10 days Patient had EGD on 05/27/2019 which has revealed no acute findings.  Recommending Protonix once a day and avoid NSAIDscolonoscopy with internal hemorrhoids  DRUG ALLERGIES:   Allergies  Allergen Reactions  . Amoxicillin Hives    Did it involve swelling of the face/tongue/throat, SOB, or low BP? No Did it involve sudden or severe rash/hives, skin peeling, or any reaction on the inside of your mouth or nose? No Did you need to seek medical attention at a hospital or doctor's office? Unknown When did it last happen?5+ years If all above answers are "NO", may proceed with cephalosporin use.   Marcelline Mates     Break out in mouth  . Lisinopril Cough  . Nortriptyline     Unknown reaction   . Orange Fruit [Citrus]     Break out in mouth  . Other     Sulfa eye drops- Scratched corneas  . Penicillins Hives    Did it involve swelling of the face/tongue/throat, SOB, or low BP? No Did it involve sudden or severe rash/hives, skin peeling, or any reaction on the inside of your mouth or nose? No Did you need to seek medical attention at a hospital or doctor's office? Unknown When did it last happen?5+ years If all above answers are "NO", may proceed with cephalosporin use.     DISCHARGE MEDICATIONS:   Allergies as of 05/30/2019      Reactions   Amoxicillin Hives   Did it involve swelling of the face/tongue/throat, SOB, or low BP? No Did it involve sudden or severe rash/hives, skin peeling, or any reaction on the inside of your mouth or nose? No Did you need to seek medical attention at a hospital or doctor's office? Unknown When did it last happen?5+ years If all above answers are "NO", may proceed with  cephalosporin use.   [name]    Break out in mouth   Lisinopril Cough   Nortriptyline    Unknown reaction    Orange Fruit [citrus]    Break out in mouth   Other    Sulfa eye drops- Scratched corneas   Penicillins Hives   Did it involve swelling of the face/tongue/throat, SOB, or low BP? No Did it involve sudden or severe rash/hives, skin peeling, or any reaction on the inside of your mouth or nose? No Did you need to seek medical attention at a hospital or doctor's office? Unknown When did it last happen?5+ years If all above answers are "NO", may proceed with cephalosporin use.      Medication List    STOP taking these medications   acetaZOLAMIDE 250 MG tablet Commonly known as: DIAMOX   tiZANidine 4 MG tablet Commonly known as: ZANAFLEX     TAKE these medications   acetaminophen 500 MG tablet Commonly known as: TYLENOL Take 1,000-1,500 mg by mouth every 6 (six) hours as needed for moderate pain or headache.   albuterol 108 (90 Base) MCG/ACT inhaler Commonly known as: VENTOLIN HFA Inhale 2 puffs into the  lungs every 6 (six) hours as needed for wheezing.   buPROPion 150 MG 12 hr tablet Commonly known as: WELLBUTRIN SR Take 150 mg by mouth 2 (two) times daily.   butalbital-acetaminophen-caffeine 50-325-40 MG tablet Commonly known as: FIORICET Take 1 tablet by mouth daily as needed for headache or migraine.   clindamycin 1 % lotion Commonly known as: CLEOCIN T Apply 1 application topically 2 (two) times daily as needed (acne).   cyclobenzaprine 10 MG tablet Commonly known as: FLEXERIL Take 1 tablet (10 mg total) by mouth as needed. What changed:   when to take this  reasons to take this   DentaGel 1.1 % Gel dental gel Generic drug: sodium fluoride Place 1 application onto teeth at bedtime.   diazepam 5 MG tablet Commonly known as: VALIUM Take 5 mg by mouth every 8 (eight) hours as needed for anxiety.   diphenhydrAMINE 25 mg capsule Commonly known  as: BENADRYL Take 25 mg by mouth 2 (two) times daily as needed for allergies or sleep.   Durezol 0.05 % Emul Generic drug: Difluprednate Place 1 drop into both eyes every 6 (six) hours as needed (uveitis).   enoxaparin 150 MG/ML injection Commonly known as: LOVENOX Inject 0.91 mLs (135 mg total) into the skin every 12 (twelve) hours for 3 days.   EpiPen 2-Pak 0.3 mg/0.3 mL Soaj injection Generic drug: EPINEPHrine Inject 0.3 mg into the muscle as needed for anaphylaxis.   fluticasone 50 MCG/ACT nasal spray Commonly known as: FLONASE Place 1 spray into both nostrils daily as needed for rhinitis.   Galcanezumab-gnlm 120 MG/ML Soaj Inject 120 mg into the skin every 28 (twenty-eight) days.   hydrocortisone 25 MG suppository Commonly known as: ANUSOL-HC Place 1 suppository (25 mg total) rectally 2 (two) times daily for 9 days.   levothyroxine 150 MCG tablet Commonly known as: SYNTHROID Take 150 mcg by mouth daily before breakfast.   Linzess 72 MCG capsule Generic drug: linaclotide Take 72 mcg by mouth daily as needed (constipation).   Melatonin 3 MG Caps Take 3 mg by mouth at bedtime as needed (sleep).   montelukast 10 MG tablet Commonly known as: SINGULAIR Take 10 mg by mouth at bedtime.   multivitamin with minerals Tabs tablet Take 1 tablet by mouth daily.   omeprazole 40 MG capsule Commonly known as: PRILOSEC Take 40 mg by mouth daily.   ondansetron 8 MG tablet Commonly known as: ZOFRAN Take 8 mg by mouth every 8 (eight) hours as needed for nausea or vomiting.   oxybutynin 5 MG tablet Commonly known as: DITROPAN Take 5 mg by mouth at bedtime.   polyethylene glycol 17 g packet Commonly known as: MIRALAX / GLYCOLAX Take 17 g by mouth daily as needed for mild constipation.   prednisoLONE acetate 1 % ophthalmic suspension Commonly known as: PRED FORTE Place 1 drop into both eyes every 4 (four) hours as needed (uveitis).   promethazine 25 MG tablet Commonly  known as: PHENERGAN Take 25 mg by mouth every 6 (six) hours as needed for nausea or vomiting.   REFRESH LIQUIGEL OP Place 1 drop into both eyes daily as needed (dry eyes).   rosuvastatin 20 MG tablet Commonly known as: CRESTOR Take 20 mg by mouth daily.   topiramate 50 MG tablet Commonly known as: TOPAMAX Take 50 mg by mouth 2 (two) times daily.   traMADol 50 MG tablet Commonly known as: ULTRAM Take 1 tablet (50 mg total) by mouth every 6 (six) hours as needed for  moderate pain.   warfarin 7.5 MG tablet Commonly known as: COUMADIN Take 1 tablet (7.5 mg total) by mouth daily at 6 PM for 2 days. What changed:   when to take this  Another medication with the same name was removed. Continue taking this medication, and follow the directions you see here.        DISCHARGE INSTRUCTIONS:   Follow-up with primary care physician in 3 days, please monitor PT/INRs and titrate Coumadin dose.  Repeat CBC during the follow-up visit Follow-up with vascular surgery Dr. Martin Majestic as recommended Follow-up with Dr. Mike Gip hematologist in 1 week Follow-up with gastroenterology Dr. Bonna Gains IN 2 weeks   DIET:  Cardiac diet  DISCHARGE CONDITION:  Stable  ACTIVITY:  Activity as tolerated  OXYGEN:  Home Oxygen: No.   Oxygen Delivery: room air  DISCHARGE LOCATION:  home   If you experience worsening of your admission symptoms, develop shortness of breath, life threatening emergency, suicidal or homicidal thoughts you must seek medical attention immediately by calling 911 or calling your MD immediately  if symptoms less severe.  You Must read complete instructions/literature along with all the possible adverse reactions/side effects for all the Medicines you take and that have been prescribed to you. Take any new Medicines after you have completely understood and accpet all the possible adverse reactions/side effects.   Please note  You were cared for by a hospitalist during your  hospital stay. If you have any questions about your discharge medications or the care you received while you were in the hospital after you are discharged, you can call the unit and asked to speak with the hospitalist on call if the hospitalist that took care of you is not available. Once you are discharged, your primary care physician will handle any further medical issues. Please note that NO REFILLS for any discharge medications will be authorized once you are discharged, as it is imperative that you return to your primary care physician (or establish a relationship with a primary care physician if you do not have one) for your aftercare needs so that they can reassess your need for medications and monitor your lab values.     Today  Chief Complaint  Patient presents with  . Rectal Bleeding   Patient is doing much better.  Patient had small yellow-colored bowel movement with no blood.  Patient is at high risk with history of pulmonary embolism in the past.  Resuming her Coumadin with Lovenox bridging after discussing the benefits and risks of resuming Coumadin.  Patient verbalized understanding.  Have discussed with the gastroenterology and patient's primary hematologist Dr. Mike Gip both are agreeable.  Patient is agreeable to and she is agreeable with Lovenox bridging will train her for Lovenox shots prior to discharge  ROS:  CONSTITUTIONAL: Denies fevers, chills. Denies any fatigue, weakness.  EYES: Denies blurry vision, double vision, eye pain. EARS, NOSE, THROAT: Denies tinnitus, ear pain, hearing loss. RESPIRATORY: Denies cough, wheeze, shortness of breath.  CARDIOVASCULAR: Denies chest pain, palpitations, edema.  GASTROINTESTINAL: Denies nausea, vomiting, diarrhea, abdominal pain. Denies bright red blood per rectum. GENITOURINARY: Denies dysuria, hematuria. ENDOCRINE: Denies nocturia or thyroid problems. HEMATOLOGIC AND LYMPHATIC: Denies easy bruising or bleeding. SKIN: Denies rash or  lesion. MUSCULOSKELETAL: Denies pain in neck, back, shoulder, knees, hips or arthritic symptoms.  NEUROLOGIC: Denies paralysis, paresthesias.  PSYCHIATRIC: Denies anxiety or depressive symptoms.   VITAL SIGNS:  Blood pressure 123/78, pulse 76, temperature 97.9 F (36.6 C), temperature source Oral, resp. rate 20,  height 5\' 3"  (1.6 m), weight 136 kg, SpO2 100 %.  I/O:    Intake/Output Summary (Last 24 hours) at 05/30/2019 1345 Last data filed at 05/30/2019 1300 Gross per 24 hour  Intake 1311.34 ml  Output 1401 ml  Net -89.66 ml    PHYSICAL EXAMINATION:  GENERAL:  43 y.o.-year-old patient lying in the bed with no acute distress.  EYES: Pupils equal, round, reactive to light and accommodation. No scleral icterus. Extraocular muscles intact.  HEENT: Head atraumatic, normocephalic. Oropharynx and nasopharynx clear.  NECK:  Supple, no jugular venous distention. No thyroid enlargement, no tenderness.  LUNGS: Normal breath sounds bilaterally, no wheezing, rales,rhonchi or crepitation. No use of accessory muscles of respiration.  CARDIOVASCULAR: S1, S2 normal. No murmurs, rubs, or gallops.  ABDOMEN: Soft, non-tender, non-distended. Bowel sounds present.  EXTREMITIES: Right groin with clean dressing no pedal edema, cyanosis, or clubbing.  NEUROLOGIC: Cranial nerves II through XII are intact. Muscle strength 5/5 in all extremities. Sensation intact. Gait not checked.  PSYCHIATRIC: The patient is alert and oriented x 3.  SKIN: No obvious rash, lesion, or ulcer.   DATA REVIEW:   CBC Recent Labs  Lab 05/30/19 0909  WBC 8.1  HGB 11.2*  HCT 34.5*  PLT 295    Chemistries  Recent Labs  Lab 05/26/19 1033  05/29/19 0504  NA 140   < > 140  K 4.1   < > 3.7  CL 108   < > 115*  CO2 23   < > 21*  GLUCOSE 99   < > 91  BUN 13   < > 10  CREATININE 0.90   < > 0.89  CALCIUM 9.2   < > 8.3*  AST 13*  --   --   ALT 13  --   --   ALKPHOS 44  --   --   BILITOT 0.7  --   --    < > = values  in this interval not displayed.    Cardiac Enzymes No results for input(s): TROPONINI in the last 168 hours.  Microbiology Results  Results for orders placed or performed during the hospital encounter of 05/26/19  SARS CORONAVIRUS 2 (TAT 6-24 HRS) Nasopharyngeal Nasopharyngeal Swab     Status: None   Collection Time: 05/26/19 10:32 AM   Specimen: Nasopharyngeal Swab  Result Value Ref Range Status   SARS Coronavirus 2 NEGATIVE NEGATIVE Final    Comment: (NOTE) SARS-CoV-2 target nucleic acids are NOT DETECTED. The SARS-CoV-2 RNA is generally detectable in upper and lower respiratory specimens during the acute phase of infection. Negative results do not preclude SARS-CoV-2 infection, do not rule out co-infections with other pathogens, and should not be used as the sole basis for treatment or other patient management decisions. Negative results must be combined with clinical observations, patient history, and epidemiological information. The expected result is Negative. Fact Sheet for Patients: SugarRoll.be Fact Sheet for Healthcare Providers: https://www.woods-mathews.com/ This test is not yet approved or cleared by the [place] FDA and  has been authorized for detection and/or diagnosis of SARS-CoV-2 by FDA under an Emergency Use Authorization (EUA). This EUA will remain  in effect (meaning this test can be used) for the duration of the COVID-19 declaration under Section 56 4(b)(1) of the Act, 21 U.S.C. section 360bbb-3(b)(1), unless the authorization is terminated or revoked sooner. Performed at [facility], [place] [address] [place], [place] [address]     RADIOLOGY:  Nm Gi Blood Loss  Result  Date: 05/26/2019 CLINICAL DATA:  Bright red blood in bowel last. EXAM: NUCLEAR MEDICINE GASTROINTESTINAL BLEEDING SCAN TECHNIQUE: Sequential abdominal images were obtained following intravenous administration of Tc-60m labeled red blood  cells. RADIOPHARMACEUTICALS:  21.2 mCi Tc-68m pertechnetate in-vitro labeled red cells. COMPARISON:  None. FINDINGS: No evidence of GI bleed. IMPRESSION: No evidence of GI bleed. Electronically Signed   By: Dorise Bullion III M.D   On: 05/26/2019 18:07   Dg Chest Port 1 View  Result Date: 05/28/2019 CLINICAL DATA:  43 year old female with history of cough. EXAM: PORTABLE CHEST 1 VIEW COMPARISON:  Chest x-ray 02/06/2019. FINDINGS: Lung volumes are normal. No consolidative airspace disease. No pleural effusions. No pneumothorax. No pulmonary nodule or mass noted. Pulmonary vasculature and the cardiomediastinal silhouette are within normal limits. IMPRESSION: No radiographic evidence of acute cardiopulmonary disease. Electronically Signed   By: Vinnie Langton M.D.   On: 05/28/2019 16:33   Ct Angio Abd/pel W/ And/or W/o  Result Date: 05/26/2019 CLINICAL DATA:  Bright red rectal bleeding since last evening. Recent EGD. History of pulmonary embolism, currently on Coumadin. EXAM: CTA ABDOMEN AND PELVIS WITHOUT AND WITH CONTRAST TECHNIQUE: Multidetector CT imaging of the abdomen and pelvis was performed using the standard protocol during bolus administration of intravenous contrast. Multiplanar reconstructed images and MIPs were obtained and reviewed to evaluate the vascular anatomy. CONTRAST:  176mL OMNIPAQUE IOHEXOL 350 MG/ML SOLN COMPARISON:  CT abdomen pelvis-01/16/2019 FINDINGS: VASCULAR Aorta: There is no significant atherosclerotic plaque within the normal caliber abdominal aorta. No evidence of abdominal aortic dissection or periaortic stranding. Celiac: Widely patent without a hemodynamically significant narrowing. Conventional branching pattern though note is made of a separate origin of the left hepatic artery in lieu of a proper hepatic artery. SMA: Widely patent without a hemodynamically significant narrowing. Conventional branching pattern. The distal tributaries the SMA appear widely patent  without discrete intraluminal filling defect to suggest distal embolism. Renals: Solitary bilaterally; both renal arteries are widely patent without hemodynamically significant narrowing. No discrete areas of vessel irregularity to suggest FMD. IMA: Widely patent without a hemodynamically significant narrowing. Inflow: The bilateral common, external and internal iliac arteries are of normal caliber and widely patent without a hemodynamically significant narrowing. Proximal Outflow: The bilateral common and imaged portions of the bilateral deep and superficial femoral arteries are widely patent without a hemodynamically significant narrowing. Veins: The IVC and pelvic venous systems are widely patent. Review of the MIP images confirms the above findings. _________________________________________________________ NON-VASCULAR Lower chest: Limited visualization of the lower thorax demonstrates improved aeration of the right costophrenic angle with minimal residual linear heterogeneous opacities favored to represent atelectasis or scarring. Unchanged subsegmental atelectasis within the contralateral left costophrenic angle and medial segment of the right middle lobe. No new focal airspace opacities. No pleural effusion. Normal heart size.  No pericardial effusion. Hepatobiliary: Normal hepatic contour. Suspected perfusion allow abnormality involving the central aspect of the lateral segment of the left lobe of the liver without associated abnormality on the acquired portal venous phase imaging. No discrete hepatic lesions. Normal appearance of the gallbladder given degree distention. No radiopaque gallstones. No intra extrahepatic biliary duct dilatation. No ascites. Pancreas: Normal appearance of the pancreas. Spleen: Normal appearance of the spleen. Adrenals/Urinary Tract: There is symmetric enhancement of the bilateral kidneys. No definite renal stones this postcontrast examination. No discrete renal lesions. No urine  obstruction or perinephric stranding. Normal appearance the bilateral adrenal glands. Normal appearance of the urinary bladder given degree distention. Stomach/Bowel: Large colonic stool burden without  evidence of enteric obstruction. Scattered colonic diverticulosis without evidence of superimposed acute diverticulitis. Normal appearance of the terminal ileum and the retrocecal appendix. No discrete areas of bowel wall thickening. Potential small hiatal hernia. No pneumoperitoneum, pneumatosis or portal venous gas. There are no discrete areas of intraluminal contrast extravasation to suggest the etiology of reported history of GI bleeding. Lymphatic: No bulky retroperitoneal, mesenteric, pelvic or inguinal lymphadenopathy. Reproductive: Post hysterectomy. No discrete adnexal lesion. No free fluid within the pelvic cul-de-sac. Other: Tiny mesenteric fat containing peri umbilical hernia. There is a minimal amount of subcutaneous edema about the midline of the low back. Ill-defined areas subcutaneous stranding within the undersurface of the abdominal pannus may represent the sequela of subcutaneous medication administration. Musculoskeletal: No acute or aggressive osseous abnormalities. Mild-to-moderate multilevel lumbar spine DDD, worse at L3-L4 with disc space height loss, endplate irregularity and sclerosis. IMPRESSION: Vascular Impression: 1. No discrete areas of intraluminal contrast extravasation to suggest etiology reported history of GI bleeding. 2. Unremarkable CTA of the abdomen pelvis. Nonvascular Impression: 1. Scattered colonic diverticulosis without evidence superimposed acute diverticulitis. 2. Large colonic stool burden without evidence of enteric obstruction. 3. Improved aeration of the right costophrenic angle with minimal residual atelectasis/scar. Electronically Signed   By: Sandi Mariscal M.D.   On: 05/26/2019 14:49    EKG:   Orders placed or performed during the hospital encounter of 02/06/19   . EKG      Management plans discussed with the patient, she is  in agreement.  CODE STATUS:     Code Status Orders  (From admission, onward)         Start     Ordered   05/26/19 2222  Full code  Continuous     05/26/19 2222        Code Status History    Date Active Date Inactive Code Status Order ID Comments User Context   12/20/2018 2348 12/23/2018 1827 Full Code [id number]  Mansy, Arvella Merles, MD ED   Advance Care Planning Activity      TOTAL TIME TAKING CARE OF THIS PATIENT: 45 minutes.   Note: This dictation was prepared with Dragon dictation along with smaller phrase technology. Any transcriptional errors that result from this process are unintentional.   @[facility] @  on 05/30/2019 at 1:45 PM  Between 7am to 6pm - Pager - [phone]  After 6pm go to [email or web address] - password [email or web address] [facility]  Office  [phone]  CC: Primary care physician; Sharyne Peach, MD

## 2019-05-30 NOTE — Progress Notes (Signed)
Angela [name] to be D/C'd home with husband per MD order.  Discussed prescriptions and follow up appointments with the patient. Prescriptions given to patient, medication list explained in detail. Pt verbalized understanding.  Allergies as of 05/30/2019       Reactions   Amoxicillin Hives   Did it involve swelling of the face/tongue/throat, SOB, or low BP? No Did it involve sudden or severe rash/hives, skin peeling, or any reaction on the inside of your mouth or nose? No Did you need to seek medical attention at a hospital or doctor's office? Unknown When did it last happen?      5+ years If all above answers are "NO", may proceed with cephalosporin use.   [name]    Break out in mouth   Lisinopril Cough   Nortriptyline    Unknown reaction    Orange Fruit [citrus]    Break out in mouth   Other    Sulfa eye drops- Scratched corneas   Penicillins Hives   Did it involve swelling of the face/tongue/throat, SOB, or low BP? No Did it involve sudden or severe rash/hives, skin peeling, or any reaction on the inside of your mouth or nose? No Did you need to seek medical attention at a hospital or doctor's office? Unknown When did it last happen?      5+ years If all above answers are "NO", may proceed with cephalosporin use.        Medication List     STOP taking these medications    acetaZOLAMIDE 250 MG tablet Commonly known as: DIAMOX   tiZANidine 4 MG tablet Commonly known as: ZANAFLEX       TAKE these medications    acetaminophen 500 MG tablet Commonly known as: TYLENOL Take 1,000-1,500 mg by mouth every 6 (six) hours as needed for moderate pain or headache.   albuterol 108 (90 Base) MCG/ACT inhaler Commonly known as: VENTOLIN HFA Inhale 2 puffs into the lungs every 6 (six) hours as needed for wheezing.   buPROPion 150 MG 12 hr tablet Commonly known as: WELLBUTRIN SR Take 150 mg by mouth 2 (two) times daily.   butalbital-acetaminophen-caffeine 50-325-40 MG  tablet Commonly known as: FIORICET Take 1 tablet by mouth daily as needed for headache or migraine.   clindamycin 1 % lotion Commonly known as: CLEOCIN T Apply 1 application topically 2 (two) times daily as needed (acne).   cyclobenzaprine 10 MG tablet Commonly known as: FLEXERIL Take 1 tablet (10 mg total) by mouth as needed. What changed:  when to take this reasons to take this   DentaGel 1.1 % Gel dental gel Generic drug: sodium fluoride Place 1 application onto teeth at bedtime.   diazepam 5 MG tablet Commonly known as: VALIUM Take 5 mg by mouth every 8 (eight) hours as needed for anxiety.   diphenhydrAMINE 25 mg capsule Commonly known as: BENADRYL Take 25 mg by mouth 2 (two) times daily as needed for allergies or sleep.   Durezol 0.05 % Emul Generic drug: Difluprednate Place 1 drop into both eyes every 6 (six) hours as needed (uveitis).   enoxaparin 150 MG/ML injection Commonly known as: LOVENOX Inject 0.91 mLs (135 mg total) into the skin every 12 (twelve) hours for 3 days.   EpiPen 2-Pak 0.3 mg/0.3 mL Soaj injection Generic drug: EPINEPHrine Inject 0.3 mg into the muscle as needed for anaphylaxis.   fluticasone 50 MCG/ACT nasal spray Commonly known as: FLONASE Place 1 spray into both nostrils daily as needed  for rhinitis.   Galcanezumab-gnlm 120 MG/ML Soaj Inject 120 mg into the skin every 28 (twenty-eight) days.   hydrocortisone 25 MG suppository Commonly known as: ANUSOL-HC Place 1 suppository (25 mg total) rectally 2 (two) times daily for 9 days.   levothyroxine 150 MCG tablet Commonly known as: SYNTHROID Take 150 mcg by mouth daily before breakfast.   Linzess 72 MCG capsule Generic drug: linaclotide Take 72 mcg by mouth daily as needed (constipation).   Melatonin 3 MG Caps Take 3 mg by mouth at bedtime as needed (sleep).   montelukast 10 MG tablet Commonly known as: SINGULAIR Take 10 mg by mouth at bedtime.   multivitamin with minerals Tabs  tablet Take 1 tablet by mouth daily.   omeprazole 40 MG capsule Commonly known as: PRILOSEC Take 40 mg by mouth daily.   ondansetron 8 MG tablet Commonly known as: ZOFRAN Take 8 mg by mouth every 8 (eight) hours as needed for nausea or vomiting.   oxybutynin 5 MG tablet Commonly known as: DITROPAN Take 5 mg by mouth at bedtime.   polyethylene glycol 17 g packet Commonly known as: MIRALAX / GLYCOLAX Take 17 g by mouth daily as needed for mild constipation.   prednisoLONE acetate 1 % ophthalmic suspension Commonly known as: PRED FORTE Place 1 drop into both eyes every 4 (four) hours as needed (uveitis).   promethazine 25 MG tablet Commonly known as: PHENERGAN Take 25 mg by mouth every 6 (six) hours as needed for nausea or vomiting.   REFRESH LIQUIGEL OP Place 1 drop into both eyes daily as needed (dry eyes).   rosuvastatin 20 MG tablet Commonly known as: CRESTOR Take 20 mg by mouth daily.   topiramate 50 MG tablet Commonly known as: TOPAMAX Take 50 mg by mouth 2 (two) times daily.   traMADol 50 MG tablet Commonly known as: ULTRAM Take 1 tablet (50 mg total) by mouth every 6 (six) hours as needed for moderate pain.   warfarin 7.5 MG tablet Commonly known as: COUMADIN Take 1 tablet (7.5 mg total) by mouth daily at 6 PM for 2 days. What changed:  when to take this Another medication with the same name was removed. Continue taking this medication, and follow the directions you see here.        Vitals:   05/30/19 0523 05/30/19 1156  BP: 118/80 123/78  Pulse: 78 76  Resp: 20   Temp: 98.2 F (36.8 C) 97.9 F (36.6 C)  SpO2: 99% 100%    Skin clean, dry and intact without evidence of skin break down, no evidence of skin tears noted. IV catheter discontinued intact. Site without signs and symptoms of complications. Dressing and pressure applied. Pt denies pain at this time. No complaints noted.  An After Visit Summary was printed and given to the  patient. Patient escorted via [place], and D/C home via private auto.  Angela [name]

## 2019-05-31 ENCOUNTER — Ambulatory Visit: Admission: RE | Admit: 2019-05-31 | Payer: Medicare Other | Source: Home / Self Care | Admitting: Vascular Surgery

## 2019-05-31 ENCOUNTER — Encounter: Admission: RE | Payer: Self-pay | Source: Home / Self Care

## 2019-05-31 SURGERY — IVC FILTER INSERTION
Anesthesia: Moderate Sedation

## 2019-05-31 NOTE — TOC Initial Note (Signed)
Transition of Care [facility]) - Initial/Assessment Note    Patient Details  Name: Angela [name] MRN: [id number] Date of Birth: 1976-02-24  Transition [facility]) CM/SW Contact:    Beverly Sessions, RN Phone Number: 05/31/2019, 9:27 AM  Clinical Narrative:                 Late Entry  Patient discharged home yesterday  Patient states she lives at home with her adult son Son provides transportation to appointments  [facility]P [place] [facility] - denies issues obtaining medications  MD has ordered home health RN and PT.  Patient will need INR checks Patient agreeable.  Patient states she does not have a preference of home health agency  Referral made and accepted to [facility] with [place]   Expected Discharge Plan: [place] Barriers to Discharge: No Barriers Identified   Patient Goals and CMS Choice     Choice offered to / list presented to : Patient  Expected Discharge Plan and Services Expected Discharge Plan: [place]   Discharge Planning Services: CM Consult Post Acute Care Choice: [place] arrangements for the past 2 months: [place] Expected Discharge Date: 05/30/19                         HH Arranged: RN, PT [facility] Agency: [place] Date HH Agency Contacted: 05/30/19   Representative spoke with at [place]: Corene Cornea  Prior Living Arrangements/Services Living arrangements for the past 2 months: [place] Lives with:: Relatives, Adult Children Patient language and need for interpreter reviewed:: Yes Do you feel safe going back to the place where you live?: Yes      Need for Family Participation in Patient Care: Yes (Comment) Care giver support system in place?: Yes (comment)   Criminal Activity/Legal Involvement Pertinent to Current Situation/Hospitalization: No - Comment as needed  Activities of Daily Living Home Assistive Devices/Equipment: None ADL  Screening (condition at time of admission) Patient's cognitive ability adequate to safely complete daily activities?: Yes Is the patient deaf or have difficulty hearing?: No Does the patient have difficulty seeing, even when wearing glasses/contacts?: No Does the patient have difficulty concentrating, remembering, or making decisions?: Yes Patient able to express need for assistance with ADLs?: Yes Does the patient have difficulty dressing or bathing?: No Independently performs ADLs?: Yes (appropriate for developmental age) Communication: Independent Does the patient have difficulty walking or climbing stairs?: Yes Weakness of Legs: Both Weakness of Arms/Hands: Both  Permission Sought/Granted                  Emotional Assessment Appearance:: Appears stated age     Orientation: : Oriented to Self, Oriented to Place, Oriented to  Time, Oriented to Situation   Psych Involvement: No (comment)  Admission diagnosis:  Diverticulosis [K57.90] Lower GI bleed [K92.2] Anticoagulated [Z79.01] Patient Active Problem List   Diagnosis Date Noted  . Stomach irritation   . Melena   . Hematochezia   . Internal hemorrhoids   . Diverticulosis of large intestine without diverticulitis   . GI bleed 05/26/2019  . Chronic venous insufficiency 05/22/2019  . Lymphedema 05/22/2019  . Uveitis 05/22/2019  . Cardiomegaly 03/12/2019  . Right lower lobe pulmonary nodule 01/30/2019  . Weight gain 01/30/2019  . Pleural nodule 01/19/2019  . Long term current use of anticoagulant therapy 01/06/2019  . 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  Pulmonary embolism with infarction ([place]) 12/20/2018  . Galactorrhea (CODE) 05/31/2018  . Fatigue 01/25/2018  . Hyperlipidemia 01/25/2018  . Hyperprolactinemia ([place]) 01/25/2018  . Hypothyroidism 01/25/2018  . Irritable bowel syndrome without diarrhea 01/25/2018  . Obesity, morbid ([place]) 01/25/2018  . Raynaud's disease 01/25/2018  . Nipple discharge  in female 11/27/2016  . Asthma, mild 02/20/2016  . Herpes simplex infection of genitourinary system 12/20/2015  . Morbid obesity with body mass index of 45.0-49.9 in adult [facility]) 12/20/2015  . Non-seasonal allergic rhinitis due to pollen 12/20/2015  . Other shoulder lesions, unspecified shoulder 07/22/2015  . Rotator cuff tendinitis 07/22/2015  . Clinical depression 06/18/2015  . Headache, migraine 06/18/2015  . 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 Calculus of kidney 02/20/2015  . Chronic sinusitis 02/20/2015  . Ringing in ear 02/20/2015  . Stuffy nose 02/20/2015  . Rheumatoid arthritis of knee ([place]) 10/09/2014  . 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[place]) 10/09/2014  . Mass of breast, right 05/24/2014  . Lump or mass in breast 05/29/2013  . Breast lump 05/29/2013  . Essential (primary) hypertension 04/26/2013  . Cephalalgia 04/26/2013  . Fibroid 04/26/2013  . Muscle ache 04/26/2013  . Bacterial infection 04/26/2013  . Pain in or around eye 04/26/2013  . Urinary tract infection 04/26/2013  . Benign intracranial hypertension 12/08/2012  . Increased prolactin level 12/08/2012  . Anxiety state 06/12/2011   PCP:  Sharyne Peach, MD Pharmacy:   [id number] [address], [place] [address] Phone: [phone] Fax: [phone]     Social Determinants of Health (SDOH) Interventions    Readmission Risk Interventions No flowsheet data found.

## 2019-06-01 ENCOUNTER — Inpatient Hospital Stay
Admission: RE | Admit: 2019-06-01 | Discharge: 2019-06-01 | Disposition: A | Discharge status: Home / Self Care | Payer: Self-pay | Source: Ambulatory Visit | Attending: Family Medicine | Admitting: Family Medicine

## 2019-06-01 ENCOUNTER — Other Ambulatory Visit: Payer: Self-pay | Admitting: Family Medicine

## 2019-06-01 DIAGNOSIS — Z1231 Encounter for screening mammogram for malignant neoplasm of breast: Secondary | ICD-10-CM

## 2019-06-02 ENCOUNTER — Encounter: Payer: Self-pay | Admitting: Hematology and Oncology

## 2019-06-02 ENCOUNTER — Telehealth (INDEPENDENT_AMBULATORY_CARE_PROVIDER_SITE_OTHER): Payer: Self-pay

## 2019-06-02 NOTE — Telephone Encounter (Signed)
Home health nurse has been made aware with medical advice and verbalize understanding

## 2019-06-02 NOTE — Telephone Encounter (Signed)
The verbal for nursing visits is fine.  The patient's INR is supposed to be managed through her PCP so the patient will need to contact them for the results and further management.

## 2019-06-02 NOTE — Progress Notes (Signed)
[facility]  [address], [address] [place], [place] [address] Phone: [phone]  Fax: [phone]   Telemedicine Office Visit:  06/06/2019  Referring physician: Sharyne Peach, MD   I connected with Lara Mulch. Reid on 06/06/2019 at 9:43 AM by videoconferencing and verified that I was speaking with the correct person using 2 identifiers. The patient was at home. I discussed the limitations, risk, security and privacy concerns of performing an evaluation and management service by videoconferencing and the availability of in person appointments. I also discussed with the patient that there may be a patient responsible charge related to this service. The patient expressed understanding and agreed to proceed.    Chief Complaint: Angela [name] is a 43 y.o. female with recurrent pulmonary embolism who is seen for 2 month assessment.  HPI: The patient was last seen in the hematology clinic on 04/03/2019. At that time, she continued to have abdominal discomfort. She described vaginal bleeding 2 days after intercourse and a history of stringy hematuria. CBC was normal. Ferritin was 62. INR was 2.0 (goal 2-3). Urinalysis showed 0-5 RBC, no hemoglobin, and many bacteria with a hazy appearance.  She was seen by Dr Allen Norris on 04/04/2019 for LUQ pain. The "burning" pain in her abdomen increased with movement and bending over. She felt better after massaging it. It was not associated with any eating or drinking. Her bowel movements were not associated with the abdominal pain. Patient denied any blood in her stool and melena. She was advised to take Flexeril, use warm compresses and avoiding any further straining of the abdominal wall muscles.   She was evaluated by Dr. Delana Meyer on 05/22/2019. She noted leg pain and edema. She was not using compression therapy. She denied any shortness of breath, pleuritic chest pain, or cough. She had no bleeding or excessive bruising.  IVC  filter placement was scheduled for 05/31/2019.  Graduated compression stockings class 1 (20-30 mmHg) on a daily basis were recommended.  She continued NSAID and antihypertensive medications.   She underwent evaluation for laparoscopic sleeve gasterectomy with Dr. Andrey Farmer on 05/24/2019. Dr. Andrey Farmer ordered a CXR, EKG, and EGD. He also orderd a dietary and psych evaluation on the patient. EGD for pre-operative evaluation on 05/24/2019 revealed a normal esophagus. Gastroesophageal flap valve classified as Hill Grade 1. There was a normal gastroesophageal junction, cardia, gastric fundus, gastric body and antrum. Gastritis. Biopsied. There was a normal ampulla, duodenal bulb, first and second portion of the duodenum.   She was admitted to [facility] from 05/26/2019 - 05/30/2019 for diverticulosis and a lower GI bleed. She had bloody bowel movements x 1 day ago. She had sharp upper abdominal pain radiating down both sides. She had constipation and diarrhea related to IBS. She had no fevers or chills. She had no nausea or vomiting. CTA abdomen and pelvis on 05/26/2019 showed scattered diverticulosis, large stool burden. Tagged RBC  scan on 05/26/2019 showed no evidence of GI bleed. CXR on 05/28/2019 revealed no acute cardiopulmonary disease. IVC filter was placed on 05/29/2019. Patient tolerated procedure well. She was put on Coumadin and Lovenox therapeutic bridging until INR was therapeutic. INR was 1.0 on 05/26/2019. INR was 1.1 on 05/30/2019.  EGD on 05/27/2019 showed no evidence of active or recent bleeding. She had a normal esophagus. There was non-bleeding erosive gastropathy. There was a normal duodenal bulb, second portion of the duodenum and examined duodenum.  No specimens were collected.    Colonoscopy on 05/27/2019 showed no evidence  of active or recent bleeding.  There was diverticulosis in the sigmoid colon. There was non-bleeding internal hemorrhoids. The examination was otherwise normal. No  specimens collected. The source of patient's bleeding was likely internal hemorrhoids secondary to a normal hemoglobin, resolution of active bleeding over the last 24 hours, and no blood throughout the exam.   During the interim, she has been "ok".  She has lower back pain (9/10). She notes not wearing any compression stockings because, she was never provided with any. She never received Lovenox, and she is having trouble getting her medication. She notes having a difficult time with the pharmacists at [facility] and she has issued two complaints so far. She is on Coumadin 10 mg 3 times a week (Monday, Wednesday, Friday) and 7.5 mg four days a week (Sunday, Tuesday, Thursday, Saturday).  [place] [phone]) checks on her INR. She reports INR x 5 days ago was 1.0. She notes Dr. Iona Beard is following her after her hospital admission.   She denies any bleeding. Her abdominal pain is improving. Abdominal pain increases when she eats. She has pain in her abdomen and back when sitting in certain positions. She notes a weight gain of 10 pounds since last visit on 04/03/2019. She denies hematuria and vaginal bleeding.   She asked for bariatric surgery clearance with Dr. Andrey Farmer. Her surgery will be scheduled in 11/2019 or 12/2019. She will require Lovenox bridging with her Coumadin around the time of her surgery.   Past Medical History:  Diagnosis Date   Anemia    Anxiety    Asthma    Depression    Fibromyalgia    GERD (gastroesophageal reflux disease)    Hypertension    Irritable bowel syndrome (IBS)    Pseudotumor cerebri    Pulmonary embolism ([place])    Raynaud's disease    Thyroid disease    hypothyroid   Vision disturbance     Past Surgical History:  Procedure Laterality Date   ABDOMINAL HYSTERECTOMY  02/06/13   BREAST BIOPSY Right October, 2014   right breast core biopsy, fibroadenomatous changes   BREAST BIOPSY Right December 2014   FNA  retroareolar nodule consistent with fibroadenoma.   BREAST BIOPSY Right 12/16/2016   Fibroadenoma in the retroareolar area at 6:00.   CESAREAN SECTION  1993, 1996, 1997   COLONOSCOPY  2015   Dr Allen Norris   COLONOSCOPY WITH PROPOFOL N/A 05/27/2019   Procedure: COLONOSCOPY WITH PROPOFOL;  Surgeon: Virgel Manifold, MD;  Location: [facility] ENDOSCOPY;  Service: Endoscopy;  Laterality: N/A;   ESOPHAGOGASTRODUODENOSCOPY (EGD) WITH PROPOFOL N/A 05/27/2019   Procedure: ESOPHAGOGASTRODUODENOSCOPY (EGD) WITH PROPOFOL;  Surgeon: Virgel Manifold, MD;  Location: [facility] ENDOSCOPY;  Service: Endoscopy;  Laterality: N/A;   IVC FILTER INSERTION N/A 05/29/2019   Procedure: IVC FILTER INSERTION;  Surgeon: Algernon Huxley, MD;  Location: [facility];  Service: Cardiovascular;  Laterality: N/A;   RHINOPLASTY     TONSILECTOMY, ADENOIDECTOMY, BILATERAL MYRINGOTOMY AND TUBES  2004   UPPER GASTROINTESTINAL ENDOSCOPY  2015   Dr Allen Norris    Family History  Problem Relation Age of Onset   Depression Mother    Migraines Mother    Diverticulitis Mother    Hypertension Mother    Heart disease Father    Diabetes Father    Hypertension Father    Stroke Sister    Cancer Maternal Aunt    Cancer Maternal Grandmother     Social History:  reports that she has never  smoked. She has never used smokeless tobacco. She reports current alcohol use. She reports that she does not use drugs. She denies drug use. She drinks alcohol occasionally, in social settings. She has 3 children and 3 grandchildren.She lives in [place]. The patient is accompanied by her grandchild, Spero Curb, today.  Participants in the patient's visit and their role in the encounter included the patient and Vito Berger, CMA, today. The intake visit was provided by Luther Redo, CMA.   Allergies:  Allergies  Allergen Reactions   Amoxicillin Hives    Did it involve swelling of the face/tongue/throat, SOB, or low BP?  No Did it involve sudden or severe rash/hives, skin peeling, or any reaction on the inside of your mouth or nose? No Did you need to seek medical attention at a hospital or doctor's office? Unknown When did it last happen?5+ years If all above answers are NO, may proceed with cephalosporin use.    [name]     Break out in mouth   Lisinopril Cough   Nortriptyline     Unknown reaction    Orange Fruit [Citrus]     Break out in mouth   Other     Sulfa eye drops- Scratched corneas   Penicillins Hives    Did it involve swelling of the face/tongue/throat, SOB, or low BP? No Did it involve sudden or severe rash/hives, skin peeling, or any reaction on the inside of your mouth or nose? No Did you need to seek medical attention at a hospital or doctor's office? Unknown When did it last happen?5+ years If all above answers are NO, may proceed with cephalosporin use.     Current Medications: Current Outpatient Medications  Medication Sig Dispense Refill   acetaminophen (TYLENOL) 500 MG tablet Take 1,000-1,500 mg by mouth every 6 (six) hours as needed for moderate pain or headache.     albuterol (PROVENTIL HFA;VENTOLIN HFA) 108 (90 BASE) MCG/ACT inhaler Inhale 2 puffs into the lungs every 6 (six) hours as needed for wheezing.     buPROPion (WELLBUTRIN SR) 150 MG 12 hr tablet Take 150 mg by mouth 2 (two) times daily.      Carboxymethylcellulose Sodium (REFRESH LIQUIGEL OP) Place 1 drop into both eyes daily as needed (dry eyes).     cyclobenzaprine (FLEXERIL) 10 MG tablet Take 1 tablet (10 mg total) by mouth as needed. (Patient taking differently: Take 10 mg by mouth 3 (three) times daily as needed for muscle spasms. ) 30 tablet 3   diazepam (VALIUM) 5 MG tablet Take 5 mg by mouth every 8 (eight) hours as needed for anxiety.     Difluprednate (DUREZOL) 0.05 % EMUL Place 1 drop into both eyes every 6 (six) hours as needed (uveitis).      diphenhydrAMINE (BENADRYL) 25 mg  capsule Take 25 mg by mouth 2 (two) times daily as needed for allergies or sleep.      EPINEPHrine (EPIPEN 2-PAK) 0.3 mg/0.3 mL IJ SOAJ injection Inject 0.3 mg into the muscle as needed for anaphylaxis.      fluticasone (FLONASE) 50 MCG/ACT nasal spray Place 1 spray into both nostrils daily as needed for rhinitis.     Galcanezumab-gnlm 120 MG/ML SOAJ Inject 120 mg into the skin every 28 (twenty-eight) days.      hydrocortisone (ANUSOL-HC) 25 MG suppository Place 1 suppository (25 mg total) rectally 2 (two) times daily for 9 days. 12 suppository 0   levothyroxine (SYNTHROID) 150 MCG tablet Take 150 mcg by mouth daily before  breakfast.      linaclotide (LINZESS) 72 MCG capsule Take 72 mcg by mouth daily as needed (constipation).     Melatonin 3 MG CAPS Take 3 mg by mouth at bedtime as needed (sleep).     montelukast (SINGULAIR) 10 MG tablet Take 10 mg by mouth at bedtime.     Multiple Vitamin (MULTIVITAMIN WITH MINERALS) TABS tablet Take 1 tablet by mouth daily.     omeprazole (PRILOSEC) 40 MG capsule Take 40 mg by mouth daily.      ondansetron (ZOFRAN) 8 MG tablet Take 8 mg by mouth every 8 (eight) hours as needed for nausea or vomiting.     oxybutynin (DITROPAN) 5 MG tablet Take 5 mg by mouth at bedtime.     prednisoLONE acetate (PRED FORTE) 1 % ophthalmic suspension Place 1 drop into both eyes every 4 (four) hours as needed (uveitis).      promethazine (PHENERGAN) 25 MG tablet Take 25 mg by mouth every 6 (six) hours as needed for nausea or vomiting.     rosuvastatin (CRESTOR) 20 MG tablet Take 20 mg by mouth daily.     topiramate (TOPAMAX) 50 MG tablet Take 50 mg by mouth 2 (two) times daily.     traMADol (ULTRAM) 50 MG tablet Take 1 tablet (50 mg total) by mouth every 6 (six) hours as needed for moderate pain. 12 tablet 0   butalbital-acetaminophen-caffeine (FIORICET) 50-325-40 MG tablet Take 1 tablet by mouth daily as needed for headache or migraine.      clindamycin (CLEOCIN  T) 1 % lotion Apply 1 application topically 2 (two) times daily as needed (acne).     enoxaparin (LOVENOX) 150 MG/ML injection Inject 0.91 mLs (135 mg total) into the skin every 12 (twelve) hours for 3 days. 5.46 mL 0   polyethylene glycol (MIRALAX / GLYCOLAX) 17 g packet Take 17 g by mouth daily as needed for mild constipation. (Patient not taking: Reported on 06/05/2019) 14 each 0   sodium fluoride (DENTAGEL) 1.1 % GEL dental gel Place 1 application onto teeth at bedtime.      warfarin (COUMADIN) 7.5 MG tablet Take 1 tablet (7.5 mg total) by mouth daily at 6 PM for 2 days. (Patient taking differently: Take 7.5 mg by mouth daily at 6 PM. 3 days a week 10 mg , then rest of the week 7.5 mg) 2 tablet 0   No current facility-administered medications for this visit.     Review of Systems  Constitutional: Negative for chills, diaphoresis, fever, malaise/fatigue and weight loss (up 10 pounds).       Doing "ok".  HENT: Negative.  Negative for congestion, ear discharge, ear pain, nosebleeds, sinus pain and sore throat.   Eyes: Negative.  Negative for blurred vision, double vision and pain.  Respiratory: Negative.  Negative for cough, hemoptysis, sputum production and shortness of breath (chronic).   Cardiovascular: Negative.  Negative for chest pain, orthopnea, leg swelling and PND.  Gastrointestinal: Positive for abdominal pain (improving, painful after eating or with certain movements). Negative for blood in stool, constipation, diarrhea, melena and vomiting.       Irritable bowel syndrome (IBS).   Genitourinary: Negative.  Negative for dysuria, frequency, hematuria and urgency.  Musculoskeletal: Positive for back pain (lower back (9/10)). Negative for joint pain and myalgias.       Fibromylagia.  Skin: Negative.  Negative for rash.  Neurological: Negative.  Negative for dizziness, tingling, sensory change, speech change, focal weakness, seizures, weakness and headaches.  Endo/Heme/Allergies:  Negative.  Does not bruise/bleed easily.  Psychiatric/Behavioral: Negative.  Negative for memory loss. The patient is not nervous/anxious and does not have insomnia.   All other systems reviewed and are negative.  Performance status (ECOG):  1-2  Physical Exam  Constitutional: She is oriented to person, place, and time. She appears well-developed and well-nourished.  HENT:  Head: Normocephalic and atraumatic.  Long dark hair.  Eyes: Conjunctivae and EOM are normal.  Glasses.  Blue contact lenses.  Neurological: She is alert and oriented to person, place, and time.  Psychiatric: She has a normal mood and affect. Her behavior is normal. Judgment and thought content normal.  Nursing note and vitals reviewed.   No visits with results within 3 Day(s) from this visit.  Latest known visit with results is:  Admission on 05/26/2019, Discharged on 05/30/2019  Component Date Value Ref Range Status   Sodium 05/26/2019 140  135 - 145 mmol/L Final   Potassium 05/26/2019 4.1  3.5 - 5.1 mmol/L Final   Chloride 05/26/2019 108  98 - 111 mmol/L Final   CO2 05/26/2019 23  22 - 32 mmol/L Final   Glucose, Bld 05/26/2019 99  70 - 99 mg/dL Final   BUN 05/26/2019 13  6 - 20 mg/dL Final   Creatinine, Ser 05/26/2019 0.90  0.44 - 1.00 mg/dL Final   Calcium 05/26/2019 9.2  8.9 - 10.3 mg/dL Final   Total Protein 05/26/2019 7.2  6.5 - 8.1 g/dL Final   Albumin 05/26/2019 3.9  3.5 - 5.0 g/dL Final   AST 05/26/2019 13* 15 - 41 U/L Final   ALT 05/26/2019 13  0 - 44 U/L Final   Alkaline Phosphatase 05/26/2019 44  38 - 126 U/L Final   Total Bilirubin 05/26/2019 0.7  0.3 - 1.2 mg/dL Final   GFR calc non Af Amer 05/26/2019 >60  >60 mL/min Final   GFR calc Af Amer 05/26/2019 >60  >60 mL/min Final   Anion gap 05/26/2019 9  5 - 15 Final   Performed at [facility], [address] [place], [place] [address]   WBC 05/26/2019 9.4  4.0 - 10.5 K/uL Final   RBC 05/26/2019 4.62  3.87 - 5.11  MIL/uL Final   Hemoglobin 05/26/2019 13.2  12.0 - 15.0 g/dL Final   HCT 05/26/2019 39.1  36.0 - 46.0 % Final   MCV 05/26/2019 84.6  80.0 - 100.0 fL Final   MCH 05/26/2019 28.6  26.0 - 34.0 pg Final   MCHC 05/26/2019 33.8  30.0 - 36.0 g/dL Final   RDW 05/26/2019 13.5  11.5 - 15.5 % Final   Platelets 05/26/2019 314  150 - 400 K/uL Final   nRBC 05/26/2019 0.0  0.0 - 0.2 % Final   Performed at [facility], [address] [place], [place] [address]   ABO/RH(D) 05/26/2019 O POS   Final   Antibody Screen 05/26/2019 NEG   Final   Sample Expiration 05/26/2019    Final                   Value:05/29/2019,2359 Performed at [facility], [address] [place], [place] [address]    Prothrombin Time 05/26/2019 13.1  11.4 - 15.2 seconds Final   INR 05/26/2019 1.0  0.8 - 1.2 Final   Comment: (NOTE) INR goal varies based on device and disease states. Performed at [facility], [address] [place], [place] [address]    HIV Screen 4th Generation wRfx 05/27/2019  NON REACTIVE  NON REACTIVE Final   Performed at [facility], [place] [address] [place], [place] [address]   Sodium 05/27/2019 140  135 - 145 mmol/L Final   Potassium 05/27/2019 3.6  3.5 - 5.1 mmol/L Final   Chloride 05/27/2019 106  98 - 111 mmol/L Final   CO2 05/27/2019 22  22 - 32 mmol/L Final   Glucose, Bld 05/27/2019 95  70 - 99 mg/dL Final   BUN 05/27/2019 10  6 - 20 mg/dL Final   Creatinine, Ser 05/27/2019 1.00  0.44 - 1.00 mg/dL Final   Calcium 05/27/2019 8.6* 8.9 - 10.3 mg/dL Final   GFR calc non Af Amer 05/27/2019 >60  >60 mL/min Final   GFR calc Af Amer 05/27/2019 >60  >60 mL/min Final   Anion gap 05/27/2019 12  5 - 15 Final   Performed at [facility], [address] [place], [place] [address]   WBC 05/27/2019 9.3  4.0 - 10.5 K/uL Final   RBC 05/27/2019 4.31  3.87 - 5.11 MIL/uL Final   Hemoglobin 05/27/2019 12.2  12.0 - 15.0 g/dL Final   HCT  05/27/2019 36.6  36.0 - 46.0 % Final   MCV 05/27/2019 84.9  80.0 - 100.0 fL Final   MCH 05/27/2019 28.3  26.0 - 34.0 pg Final   MCHC 05/27/2019 33.3  30.0 - 36.0 g/dL Final   RDW 05/27/2019 13.4  11.5 - 15.5 % Final   Platelets 05/27/2019 321  150 - 400 K/uL Final   nRBC 05/27/2019 0.0  0.0 - 0.2 % Final   Performed at [facility], [address] [place], [place] [address]   H. pylori UBiT 05/27/2019 Negative  Negative Final   Comment: (NOTE) Performed At: [facility] [place], [place] [id number] Rush Farmer MD [id number]    Hemoglobin 05/27/2019 11.5* 12.0 - 15.0 g/dL Final   HCT 05/27/2019 34.4* 36.0 - 46.0 % Final   Performed at [facility], [address] [place], [place] [address]   WBC 05/28/2019 8.5  4.0 - 10.5 K/uL Final   RBC 05/28/2019 4.00  3.87 - 5.11 MIL/uL Final   Hemoglobin 05/28/2019 11.2* 12.0 - 15.0 g/dL Final   HCT 05/28/2019 34.2* 36.0 - 46.0 % Final   MCV 05/28/2019 85.5  80.0 - 100.0 fL Final   MCH 05/28/2019 28.0  26.0 - 34.0 pg Final   MCHC 05/28/2019 32.7  30.0 - 36.0 g/dL Final   RDW 05/28/2019 13.5  11.5 - 15.5 % Final   Platelets 05/28/2019 279  150 - 400 K/uL Final   nRBC 05/28/2019 0.0  0.0 - 0.2 % Final   Performed at [facility], [address] [place], [place] [address]   WBC 05/29/2019 8.4  4.0 - 10.5 K/uL Final   RBC 05/29/2019 4.07  3.87 - 5.11 MIL/uL Final   Hemoglobin 05/29/2019 11.4* 12.0 - 15.0 g/dL Final   HCT 05/29/2019 34.8* 36.0 - 46.0 % Final   MCV 05/29/2019 85.5  80.0 - 100.0 fL Final   MCH 05/29/2019 28.0  26.0 - 34.0 pg Final   MCHC 05/29/2019 32.8  30.0 - 36.0 g/dL Final   RDW 05/29/2019 13.4  11.5 - 15.5 % Final   Platelets 05/29/2019 288  150 - 400 K/uL Final   nRBC 05/29/2019 0.0  0.0 - 0.2 % Final   Performed at [facility], [address] [place], [place] [address]   Sodium 05/29/2019 140  135 -  145 mmol/L Final    Potassium 05/29/2019 3.7  3.5 - 5.1 mmol/L Final   Chloride 05/29/2019 115* 98 - 111 mmol/L Final   CO2 05/29/2019 21* 22 - 32 mmol/L Final   Glucose, Bld 05/29/2019 91  70 - 99 mg/dL Final   BUN 05/29/2019 10  6 - 20 mg/dL Final   Creatinine, Ser 05/29/2019 0.89  0.44 - 1.00 mg/dL Final   Calcium 05/29/2019 8.3* 8.9 - 10.3 mg/dL Final   GFR calc non Af Amer 05/29/2019 >60  >60 mL/min Final   GFR calc Af Amer 05/29/2019 >60  >60 mL/min Final   Anion gap 05/29/2019 4* 5 - 15 Final   Performed at [facility], [address] [place], [place] [address]   WBC 05/30/2019 8.1  4.0 - 10.5 K/uL Final   RBC 05/30/2019 3.95  3.87 - 5.11 MIL/uL Final   Hemoglobin 05/30/2019 11.2* 12.0 - 15.0 g/dL Final   HCT 05/30/2019 34.5* 36.0 - 46.0 % Final   MCV 05/30/2019 87.3  80.0 - 100.0 fL Final   MCH 05/30/2019 28.4  26.0 - 34.0 pg Final   MCHC 05/30/2019 32.5  30.0 - 36.0 g/dL Final   RDW 05/30/2019 13.7  11.5 - 15.5 % Final   Platelets 05/30/2019 295  150 - 400 K/uL Final   nRBC 05/30/2019 0.0  0.0 - 0.2 % Final   Performed at [facility], [address] [place], [place] [address]   Prothrombin Time 05/30/2019 13.6  11.4 - 15.2 seconds Final   INR 05/30/2019 1.1  0.8 - 1.2 Final   Comment: (NOTE) INR goal varies based on device and disease states. Performed at [facility], [address] [place], [place] [address]     Assessment:  Angela [name] is a 43 y.o. 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 She is on Coumadin.  Work-up on 12/22/2018 included the following normal/negative labs: CBC with diff, lupus anticoagulant, protein C activity, and protein S activity/antigen.Normal labson 01/06/2019 included: Factor V Leiden, prothrombin gene mutation, anticardiolipin antibodies, and beta2-glycoprotein antibodies.  Chest CT angiogramon 12/20/2018 revealed a wedge-shaped opacity in the right lower lobelateral  basal segment consistent with a pulmonary infarct. There was relative hypoenhancement within the associated segmental artery probablysecondary toa small pulmonary embolus, though it wasdifficult to assessgiven limitationsof beam attenuation caused by the patient's body habitus. There was a small right pleural effusion with associated atelectasis.  Bilateral lower extremity duplexon 12/21/2018 revealed no femoropopliteal and no calf DVT in the visualized calf veins.  AbdomenandpelvisCTon 01/16/2019 revealed no acute findings. There was a new 2 cm pleural-based nodule in posterior right lower lobe.  PET scanon 01/26/2019 revealed no hypermetabolic findings highly suspicious for a malignant process. 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    She was admitted to [facility] from 05/26/2019 - 05/30/2019 for diverticulosis and a lower GI bleed. CTA abdomen and pelvis on 05/26/2019 showed scattered diverticulosis, large stool burden. Tagged RBC scan on 05/26/2019 showed no evidence of GI bleed. IVC filter was placed on 05/29/2019. EGD and colonoscopy on 05/27/2019 showed no evidence of active or recent bleeding.  The source of patient's bleeding was likely internal hemorrhoids.   Symptomatically, she is doing"ok".  She denies any excess bruising or bleeding.  Plan: 1.   Recurrent pulmonary embolism Patientwithpulmonary embolism  in 2006and2020. Work-up was negative.             PET scan revealed no malignancy. She has an IVC filter in place.  She was to be on a Lovenox bridge with Coumadin at recent hospital discharge.   She has been unable to obtain her Lovenox from the pharmacy.  She is currently taking Coumadin 10 mg  3x/week (Monday, Wednesday, Friday) and 7.5 mg 4x/week (Sunday, Tuesday, Thursday, Saturday).   She previously took Coumadin 5 mg daily 6x/week and 7.5 mg on Tuesdays.  INR has not been checked since 05/30/2019.   INR was 1.1 at that time.             GoalINR is 2-3.  Per patient report, INR checked by home health.  Contact Dr Iona Beard today regarding concern for no Lovenox bridge and unknown INR at higher than patient's normal doses of Coumadin- done. 2.Interval GI bleeding, resolved  Extensive work-up in hospital reviewed.  Bleeding apparently from hemorrhoids.  Patient to contact clinic if any excess bleeding. 3.RN to contact Dr Cleon Dew office [phone]) re: Coumadin and Lovenox. 4.   Contact home health [phone]) re: INR checks. 5.   RTC in 1 month for MD assess and labs (CBC, CMP, ferritin, iron studies, PT/INR).  I discussed the assessment and treatment plan with the patient.  The patient was provided an opportunity to ask questions and all were answered.  The patient agreed with the plan and demonstrated an understanding of the instructions.  The patient was advised to call back if the symptoms worsen or if the condition fails to improve as anticipated.  I provided 24 minutes (9:43 AM - 10:07 AM) of face-to-face time during this this encounter and > 50% was spent counseling as documented under my assessment and plan.    Lequita Asal, MD, PhD    06/06/2019, 9:43 AM  I, Selena Batten, am acting as scribe for [employer]. Mike Gip, MD, PhD.  I, Klover Priestly C. Mike Gip, MD, have reviewed the above documentation for accuracy and completeness, and I agree with the above.

## 2019-06-05 ENCOUNTER — Encounter: Payer: Self-pay | Admitting: Hematology and Oncology

## 2019-06-05 NOTE — Progress Notes (Signed)
The patient  c/o lower back pain ( pain level 9) headaches. The patient name and DOB has been verified by phone today.

## 2019-06-06 ENCOUNTER — Inpatient Hospital Stay: Payer: Medicare Other | Attending: Hematology and Oncology | Admitting: Hematology and Oncology

## 2019-06-06 ENCOUNTER — Encounter: Payer: Self-pay | Admitting: Hematology and Oncology

## 2019-06-06 ENCOUNTER — Other Ambulatory Visit: Payer: Self-pay | Admitting: Hematology and Oncology

## 2019-06-06 DIAGNOSIS — D500 Iron deficiency anemia secondary to blood loss (chronic): Secondary | ICD-10-CM

## 2019-06-06 DIAGNOSIS — I2699 Other pulmonary embolism without acute cor pulmonale: Secondary | ICD-10-CM | POA: Diagnosis not present

## 2019-06-06 DIAGNOSIS — Z7901 Long term (current) use of anticoagulants: Secondary | ICD-10-CM | POA: Diagnosis not present

## 2019-06-06 DIAGNOSIS — K921 Melena: Secondary | ICD-10-CM | POA: Diagnosis not present

## 2019-06-06 DIAGNOSIS — D649 Anemia, unspecified: Secondary | ICD-10-CM | POA: Diagnosis not present

## 2019-06-07 ENCOUNTER — Telehealth (INDEPENDENT_AMBULATORY_CARE_PROVIDER_SITE_OTHER): Payer: Self-pay

## 2019-06-07 ENCOUNTER — Telehealth: Payer: Self-pay

## 2019-06-07 NOTE — Telephone Encounter (Signed)
Dr Mike Gip would like to speak with you about this patient care. Please reach Dr Mike Gip at this number [phone]. 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 Please advise as soon as possible. Thanks [place], [place]

## 2019-06-07 NOTE — Telephone Encounter (Signed)
Spoke with Dr Iona Beard office Jeannene Patella) to inform them that Dr Mike Gip is still waiting on a call from the provider about this patient care. Pam inform me that the Dr did get the message and is very busy seeing patient and is not able to come to the phone at this time. She inform me that she will remind her to call Dr Mike Gip that Dr Iona Beard have all the prior information and will reach out to Dr Mike Gip as soon as she is less busy. I have also reached out to [facility] several times and never got anyone on the phone. I have been unsuccessful with get someone on the phone to inform them about the patient not get her PT & INR checked on a regular, per Dr Mike Gip.

## 2019-06-08 ENCOUNTER — Other Ambulatory Visit: Payer: Medicare Other

## 2019-06-08 ENCOUNTER — Telehealth: Payer: Self-pay | Admitting: *Deleted

## 2019-06-08 NOTE — Telephone Encounter (Signed)
-----   Message from Lequita Asal, MD sent at 06/08/2019 12:37 PM EDT ----- Regarding: RE: orders Contact: [phone]  This patient's INR is being managed by Dr Iona Beard (I spoke with her yesterday). She does not want home health to do the labs. She wants her to come into her office.  M ----- Message ----- From: Secundino Ginger Sent: 06/08/2019  11:34 AM EDT To: Lequita Asal, MD, Sabino Gasser, RN Subject: orders                                         [facility] called and asked to speak to nurse about getting orders for this patient. Please call [facility].

## 2019-06-08 NOTE — Telephone Encounter (Signed)
Per [facility] - called.  "[facility] left a msg that she needs averbal order for 1 wek of nursing an social work you can leave a VM msg on her phone [phone]"

## 2019-06-08 NOTE — Telephone Encounter (Signed)
The IVC filter wouldn't cause leg cramps.  The patient has recently had a lot of things that could affect her electrolyte balance and if some things are off it can cause leg cramps.  Typically vascular causes of leg cramps would be due to lack of blood flow and it would generally occur while the patient is walking or exercising.  If she's having these cramps randomly throughout the day or in the evening, its probably more related to electrolytes.  She can try drinking gatorade or powerade.  Although it sounds strange, mustard can also help when having leg cramps.  She should discuss it with her PCP so that they can work it up

## 2019-06-08 NOTE — Telephone Encounter (Signed)
Call returned placed. Left vm stating call was being returned.

## 2019-06-08 NOTE — Telephone Encounter (Signed)
-----   Message from Secundino Ginger sent at 06/08/2019 11:34 AM EDT ----- Regarding: orders Contact: [phone] [facility] called and asked to speak to nurse about getting orders for this patient. Please call [facility].

## 2019-06-08 NOTE — Telephone Encounter (Signed)
Patient has been aware with medical advice and verbalized understanding 

## 2019-06-09 NOTE — Telephone Encounter (Signed)
  I believe all of her home health etc is being taken care of by her PCP, Dr Iona Beard.  M

## 2019-06-22 ENCOUNTER — Encounter: Payer: Self-pay | Admitting: Hematology and Oncology

## 2019-06-23 ENCOUNTER — Telehealth: Payer: Self-pay

## 2019-06-23 NOTE — Telephone Encounter (Signed)
Unable to leave a message/ No answer. I called to inform the patient that at this time Dr Mike Gip can not give her a medical clarence for surgery.

## 2019-06-26 ENCOUNTER — Other Ambulatory Visit: Payer: Self-pay | Admitting: Family Medicine

## 2019-06-26 ENCOUNTER — Ambulatory Visit
Admission: RE | Admit: 2019-06-26 | Discharge: 2019-06-26 | Disposition: A | Discharge status: Home / Self Care | Payer: Medicare Other | Source: Ambulatory Visit | Attending: Family Medicine | Admitting: Family Medicine

## 2019-06-26 DIAGNOSIS — N63 Unspecified lump in unspecified breast: Secondary | ICD-10-CM

## 2019-06-26 DIAGNOSIS — N6320 Unspecified lump in the left breast, unspecified quadrant: Secondary | ICD-10-CM

## 2019-06-26 DIAGNOSIS — N6322 Unspecified lump in the left breast, upper inner quadrant: Secondary | ICD-10-CM | POA: Diagnosis not present

## 2019-06-28 ENCOUNTER — Other Ambulatory Visit: Payer: Self-pay

## 2019-06-28 DIAGNOSIS — D500 Iron deficiency anemia secondary to blood loss (chronic): Secondary | ICD-10-CM

## 2019-06-30 ENCOUNTER — Inpatient Hospital Stay: Payer: Medicare Other | Attending: Hematology and Oncology

## 2019-07-02 NOTE — Progress Notes (Signed)
[facility]  [address], [address] [place], [place] [address] Phone: [phone]  Fax: [phone]   Telemedicine Office Visit:  07/03/2019  Referring physician: Sharyne Peach, MD  I connected with Lajuana Carry on 07/03/2019 at 2:14 PM by videoconferencing and verified that I was speaking with the correct person using 2 identifiers.  The patient was at home.  I discussed the limitations, risk, security and privacy concerns of performing an evaluation and management service by videoconferencing and the availability of in person appointments.  I also discussed with the patient that there may be a patient responsible charge related to this service.  The patient expressed understanding and agreed to proceed.   Chief Complaint: Laylanni Pedley is a 43 y.o. female with recurrent pulmonary embolism who is seen for 1 month assessment.  HPI: The patient was last seen in the hematology clinic on 06/06/2019 via telemedicine. At that time, she was doing "ok".  She denied any excess bruising or bleeding. Hematocrit 34.5. Hemoglobin 11.2. INR 1.1. She continued on Coumadin.   Dr Iona Beard was contacted regarding her anticoagulation.  Bilateral mammogram and left breast ultrasound on 06/26/2019 revealed a probably benign, 12 mm fibrocystic lesion at the 9:30 o'clock position, 7 cm the nipple.  Ultrasound revealed a cystic and solid lesion with echogenic foci consistent with calcifications. The appearance was consistent with a fibrocystic lesion. Short-term follow-up (diagnostic left mammogram and ultrasound) was recommended in 6 months.    During the interim, she has felt "ok".  She has a headache. She notes bruising on her legs. She notes intentional weight loss. She is eating 3 meals a day. She continues to have abdominal pain. She will see Dr. Allen Norris on 07/04/2019. She notes back pain and insomnia. Her fibromyalgia is not improving. The patient has some breast pain.  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   She is on Coumadin 10 mg 3 days/week and 7.5 mg 4 days/week. Her last INR was 1.5. Dr Iona Beard is following her. Her INR is being checked every other week. She is not on Lovenox.   Labs on 06/21/2019 revealed a hematocrit of 38.0, hemoglobin 12.6, MCV 85, platelets 343,000, WBC 10,900.  Ferritin was 34, [id number] and folic acid [id number].  Sodium was 139, creatinine 1.0, albumin 3.3, total protein 6.2.  She is planning gastric sleeve surgery between 08/2019 and 10/2019.   Past Medical History:  Diagnosis Date   Anemia    Anxiety    Asthma    Depression    Fibromyalgia    GERD (gastroesophageal reflux disease)    Hypertension    Irritable bowel syndrome (IBS)    Pseudotumor cerebri    Pulmonary embolism ([place])    Raynaud's disease    Thyroid disease    hypothyroid   Vision disturbance     Past Surgical History:  Procedure Laterality Date   ABDOMINAL HYSTERECTOMY  02/06/13   BREAST BIOPSY Right October, 2014   right breast core biopsy, fibroadenomatous changes   BREAST BIOPSY Right December 2014   FNA retroareolar nodule consistent with fibroadenoma.   BREAST BIOPSY Right 12/16/2016   Fibroadenoma in the retroareolar area at 6:00.   CESAREAN SECTION  1993, 1996, 1997   COLONOSCOPY  2015   Dr Allen Norris   COLONOSCOPY WITH PROPOFOL N/A 05/27/2019   Procedure: COLONOSCOPY WITH PROPOFOL;  Surgeon: Virgel Manifold, MD;  Location: [facility] ENDOSCOPY;  Service: Endoscopy;  Laterality: N/A;   ESOPHAGOGASTRODUODENOSCOPY (EGD) WITH PROPOFOL  N/A 05/27/2019   Procedure: ESOPHAGOGASTRODUODENOSCOPY (EGD) WITH PROPOFOL;  Surgeon: Virgel Manifold, MD;  Location: [facility] ENDOSCOPY;  Service: Endoscopy;  Laterality: N/A;   IVC FILTER INSERTION N/A 05/29/2019   Procedure: IVC FILTER INSERTION;  Surgeon: Algernon Huxley, MD;  Location: [facility];  Service: Cardiovascular;  Laterality: N/A;   RHINOPLASTY     TONSILECTOMY,  ADENOIDECTOMY, BILATERAL MYRINGOTOMY AND TUBES  2004   UPPER GASTROINTESTINAL ENDOSCOPY  2015   Dr Allen Norris    Family History  Problem Relation Age of Onset   Depression Mother    Migraines Mother    Diverticulitis Mother    Hypertension Mother    Heart disease Father    Diabetes Father    Hypertension Father    Stroke Sister    Cancer Maternal Aunt    Breast cancer Maternal Aunt    Cancer Maternal Grandmother     Social History:  reports that she has never smoked. She has never used smokeless tobacco. She reports current alcohol use. She reports that she does not use drugs. She drinks alcohol occasionally, in social settings. She has 3 children and 3 grandchildren.She lives in [place]. The patient is alone today.  Participants in the patient's visit and their role in the encounter included the patient Orlene Och, RN today.  The intake visit was provided by Orlene Och.   Allergies:  Allergies  Allergen Reactions   Amoxicillin Hives    Did it involve swelling of the face/tongue/throat, SOB, or low BP? No Did it involve sudden or severe rash/hives, skin peeling, or any reaction on the inside of your mouth or nose? No Did you need to seek medical attention at a hospital or doctor's office? Unknown When did it last happen?5+ years If all above answers are NO, may proceed with cephalosporin use.    [name]     Break out in mouth   Lisinopril Cough   Nortriptyline     Unknown reaction    Orange Fruit [Citrus]     Break out in mouth   Other     Sulfa eye drops- Scratched corneas   Penicillins Hives    Did it involve swelling of the face/tongue/throat, SOB, or low BP? No Did it involve sudden or severe rash/hives, skin peeling, or any reaction on the inside of your mouth or nose? No Did you need to seek medical attention at a hospital or doctor's office? Unknown When did it last happen?5+ years If all above answers are NO, may  proceed with cephalosporin use.     Current Medications: Current Outpatient Medications  Medication Sig Dispense Refill   acetaminophen (TYLENOL) 500 MG tablet Take 1,000-1,500 mg by mouth every 6 (six) hours as needed for moderate pain or headache.     albuterol (PROVENTIL HFA;VENTOLIN HFA) 108 (90 BASE) MCG/ACT inhaler Inhale 2 puffs into the lungs every 6 (six) hours as needed for wheezing.     buPROPion (WELLBUTRIN SR) 150 MG 12 hr tablet Take 150 mg by mouth 2 (two) times daily.      butalbital-acetaminophen-caffeine (FIORICET) 50-325-40 MG tablet Take 1 tablet by mouth daily as needed for headache or migraine.      Carboxymethylcellulose Sodium (REFRESH LIQUIGEL OP) Place 1 drop into both eyes daily as needed (dry eyes).     clindamycin (CLEOCIN T) 1 % lotion Apply 1 application topically 2 (two) times daily as needed (acne).     cyclobenzaprine (FLEXERIL) 10 MG tablet Take 1 tablet (10 mg  total) by mouth as needed. (Patient taking differently: Take 10 mg by mouth 3 (three) times daily as needed for muscle spasms. ) 30 tablet 3   diazepam (VALIUM) 5 MG tablet Take 5 mg by mouth every 8 (eight) hours as needed for anxiety.     Difluprednate (DUREZOL) 0.05 % EMUL Place 1 drop into both eyes every 6 (six) hours as needed (uveitis).      diphenhydrAMINE (BENADRYL) 25 mg capsule Take 25 mg by mouth 2 (two) times daily as needed for allergies or sleep.      fluticasone (FLONASE) 50 MCG/ACT nasal spray Place 1 spray into both nostrils daily as needed for rhinitis.     Galcanezumab-gnlm 120 MG/ML SOAJ Inject 120 mg into the skin every 28 (twenty-eight) days.      levothyroxine (SYNTHROID) 150 MCG tablet Take 150 mcg by mouth daily before breakfast.      linaclotide (LINZESS) 72 MCG capsule Take 72 mcg by mouth daily as needed (constipation).     Melatonin 3 MG CAPS Take 3 mg by mouth at bedtime as needed (sleep).     montelukast (SINGULAIR) 10 MG tablet Take 10 mg by mouth at  bedtime.     Multiple Vitamin (MULTIVITAMIN WITH MINERALS) TABS tablet Take 1 tablet by mouth daily.     ondansetron (ZOFRAN) 8 MG tablet Take 8 mg by mouth every 8 (eight) hours as needed for nausea or vomiting.     oxybutynin (DITROPAN) 5 MG tablet Take 5 mg by mouth at bedtime.     pantoprazole (PROTONIX) 40 MG tablet Take by mouth.     phentermine (ADIPEX-P) 37.5 MG tablet Take by mouth. 0.5 tablet     prednisoLONE acetate (PRED FORTE) 1 % ophthalmic suspension Place 1 drop into both eyes every 4 (four) hours as needed (uveitis).      promethazine (PHENERGAN) 25 MG tablet Take 25 mg by mouth every 6 (six) hours as needed for nausea or vomiting.     rosuvastatin (CRESTOR) 20 MG tablet Take 20 mg by mouth daily.     sodium fluoride (DENTAGEL) 1.1 % GEL dental gel Place 1 application onto teeth at bedtime.      topiramate (TOPAMAX) 50 MG tablet Take 50 mg by mouth 2 (two) times daily.     traMADol (ULTRAM) 50 MG tablet Take 1 tablet (50 mg total) by mouth every 6 (six) hours as needed for moderate pain. 12 tablet 0   cyclobenzaprine (FLEXERIL) 5 MG tablet      EPINEPHrine (EPIPEN 2-PAK) 0.3 mg/0.3 mL IJ SOAJ injection Inject 0.3 mg into the muscle as needed for anaphylaxis.      omeprazole (PRILOSEC) 40 MG capsule Take 40 mg by mouth daily.      polyethylene glycol (MIRALAX / GLYCOLAX) 17 g packet Take 17 g by mouth daily as needed for mild constipation. (Patient not taking: Reported on 07/03/2019) 14 each 0   warfarin (COUMADIN) 7.5 MG tablet Take 1 tablet (7.5 mg total) by mouth daily at 6 PM for 2 days. (Patient taking differently: Take 7.5 mg by mouth daily at 6 PM. 3 days a week 10 mg , then rest of the week 7.5 mg) 2 tablet 0   No current facility-administered medications for this visit.     Review of Systems  Constitutional: Positive for weight loss (intentional). Negative for chills, diaphoresis, fever and malaise/fatigue.       Feels "ok".  HENT: Negative.  Negative  for congestion, ear discharge, ear  pain, nosebleeds, sinus pain and sore throat.   Eyes: Negative.  Negative for blurred vision, double vision and pain.  Respiratory: Negative.  Negative for cough, hemoptysis, sputum production and shortness of breath (chronic).   Cardiovascular: Positive for chest pain (left and right breast pain). Negative for orthopnea, leg swelling and PND.  Gastrointestinal: Positive for abdominal pain. Negative for blood in stool, constipation, diarrhea, melena and vomiting.       Irritable bowel syndrome (IBS). Eating 3 meals daily.  Genitourinary: Negative.  Negative for dysuria, frequency, hematuria and urgency.  Musculoskeletal: Positive for back pain. Negative for joint pain and myalgias.       Fibromylagia, not improving. Hemangioma on neck (C4).  Skin: Negative.  Negative for rash.  Neurological: Positive for headaches. Negative for dizziness, tingling, sensory change, speech change, focal weakness, seizures and weakness.  Endo/Heme/Allergies: Bruises/bleeds easily (bruising on legs).  Psychiatric/Behavioral: Negative for memory loss. The patient has insomnia. The patient is not nervous/anxious.   All other systems reviewed and are negative.   Performance status (ECOG): 1-2  Physical Exam  Constitutional: She is oriented to person, place, and time. She appears well-developed and well-nourished.  HENT:  Head: Normocephalic and atraumatic.  Long dark hair. Pierced lip.  Eyes: Conjunctivae and EOM are normal.  Glasses.  Blue contact lenses.  Neurological: She is alert and oriented to person, place, and time.  Psychiatric: She has a normal mood and affect. Her behavior is normal. Judgment and thought content normal.  Nursing note reviewed.   No visits with results within 3 Day(s) from this visit.  Latest known visit with results is:  Admission on 05/26/2019, Discharged on 05/30/2019  Component Date Value Ref Range Status   Sodium 05/26/2019 140  135 - 145  mmol/L Final   Potassium 05/26/2019 4.1  3.5 - 5.1 mmol/L Final   Chloride 05/26/2019 108  98 - 111 mmol/L Final   CO2 05/26/2019 23  22 - 32 mmol/L Final   Glucose, Bld 05/26/2019 99  70 - 99 mg/dL Final   BUN 05/26/2019 13  6 - 20 mg/dL Final   Creatinine, Ser 05/26/2019 0.90  0.44 - 1.00 mg/dL Final   Calcium 05/26/2019 9.2  8.9 - 10.3 mg/dL Final   Total Protein 05/26/2019 7.2  6.5 - 8.1 g/dL Final   Albumin 05/26/2019 3.9  3.5 - 5.0 g/dL Final   AST 05/26/2019 13* 15 - 41 U/L Final   ALT 05/26/2019 13  0 - 44 U/L Final   Alkaline Phosphatase 05/26/2019 44  38 - 126 U/L Final   Total Bilirubin 05/26/2019 0.7  0.3 - 1.2 mg/dL Final   GFR calc non Af Amer 05/26/2019 >60  >60 mL/min Final   GFR calc Af Amer 05/26/2019 >60  >60 mL/min Final   Anion gap 05/26/2019 9  5 - 15 Final   Performed at [facility], [address] [place], [place] [address]   WBC 05/26/2019 9.4  4.0 - 10.5 K/uL Final   RBC 05/26/2019 4.62  3.87 - 5.11 MIL/uL Final   Hemoglobin 05/26/2019 13.2  12.0 - 15.0 g/dL Final   HCT 05/26/2019 39.1  36.0 - 46.0 % Final   MCV 05/26/2019 84.6  80.0 - 100.0 fL Final   MCH 05/26/2019 28.6  26.0 - 34.0 pg Final   MCHC 05/26/2019 33.8  30.0 - 36.0 g/dL Final   RDW 05/26/2019 13.5  11.5 - 15.5 % Final   Platelets 05/26/2019 314  150 - 400 K/uL  Final   nRBC 05/26/2019 0.0  0.0 - 0.2 % Final   Performed at [facility], [address] [place], [place] [address]   ABO/RH(D) 05/26/2019 O POS   Final   Antibody Screen 05/26/2019 NEG   Final   Sample Expiration 05/26/2019    Final                   Value:05/29/2019,2359 Performed at [facility], [address] [place], [place] [address]    Prothrombin Time 05/26/2019 13.1  11.4 - 15.2 seconds Final   INR 05/26/2019 1.0  0.8 - 1.2 Final   Comment: (NOTE) INR goal varies based on device and disease states. Performed at [facility], [address],  [place], [place] [address]    HIV Screen 4th Generation wRfx 05/27/2019 NON REACTIVE  NON REACTIVE Final   Performed at [facility], [place] [address], [place], [place] [address]   Sodium 05/27/2019 140  135 - 145 mmol/L Final   Potassium 05/27/2019 3.6  3.5 - 5.1 mmol/L Final   Chloride 05/27/2019 106  98 - 111 mmol/L Final   CO2 05/27/2019 22  22 - 32 mmol/L Final   Glucose, Bld 05/27/2019 95  70 - 99 mg/dL Final   BUN 05/27/2019 10  6 - 20 mg/dL Final   Creatinine, Ser 05/27/2019 1.00  0.44 - 1.00 mg/dL Final   Calcium 05/27/2019 8.6* 8.9 - 10.3 mg/dL Final   GFR calc non Af Amer 05/27/2019 >60  >60 mL/min Final   GFR calc Af Amer 05/27/2019 >60  >60 mL/min Final   Anion gap 05/27/2019 12  5 - 15 Final   Performed at [facility], [address] [place], [place] [address]   WBC 05/27/2019 9.3  4.0 - 10.5 K/uL Final   RBC 05/27/2019 4.31  3.87 - 5.11 MIL/uL Final   Hemoglobin 05/27/2019 12.2  12.0 - 15.0 g/dL Final   HCT 05/27/2019 36.6  36.0 - 46.0 % Final   MCV 05/27/2019 84.9  80.0 - 100.0 fL Final   MCH 05/27/2019 28.3  26.0 - 34.0 pg Final   MCHC 05/27/2019 33.3  30.0 - 36.0 g/dL Final   RDW 05/27/2019 13.4  11.5 - 15.5 % Final   Platelets 05/27/2019 321  150 - 400 K/uL Final   nRBC 05/27/2019 0.0  0.0 - 0.2 % Final   Performed at [facility], [address] [place], [place] [address]   H. pylori UBiT 05/27/2019 Negative  Negative Final   Comment: (NOTE) Performed At: [facility] [place], [place] [id number] Rush Farmer MD [id number]    Hemoglobin 05/27/2019 11.5* 12.0 - 15.0 g/dL Final   HCT 05/27/2019 34.4* 36.0 - 46.0 % Final   Performed at [facility], [address] [place], [place] [address]   WBC 05/28/2019 8.5  4.0 - 10.5 K/uL Final   RBC 05/28/2019 4.00  3.87 - 5.11 MIL/uL Final   Hemoglobin 05/28/2019 11.2* 12.0 - 15.0 g/dL Final   HCT 05/28/2019 34.2* 36.0 - 46.0 %  Final   MCV 05/28/2019 85.5  80.0 - 100.0 fL Final   MCH 05/28/2019 28.0  26.0 - 34.0 pg Final   MCHC 05/28/2019 32.7  30.0 - 36.0 g/dL Final   RDW 05/28/2019 13.5  11.5 - 15.5 % Final   Platelets 05/28/2019 279  150 - 400 K/uL Final   nRBC 05/28/2019 0.0  0.0 - 0.2 % Final  Performed at [facility], [address] [place], [place] [address]   WBC 05/29/2019 8.4  4.0 - 10.5 K/uL Final   RBC 05/29/2019 4.07  3.87 - 5.11 MIL/uL Final   Hemoglobin 05/29/2019 11.4* 12.0 - 15.0 g/dL Final   HCT 05/29/2019 34.8* 36.0 - 46.0 % Final   MCV 05/29/2019 85.5  80.0 - 100.0 fL Final   MCH 05/29/2019 28.0  26.0 - 34.0 pg Final   MCHC 05/29/2019 32.8  30.0 - 36.0 g/dL Final   RDW 05/29/2019 13.4  11.5 - 15.5 % Final   Platelets 05/29/2019 288  150 - 400 K/uL Final   nRBC 05/29/2019 0.0  0.0 - 0.2 % Final   Performed at [facility], [address] [place], [place] [address]   Sodium 05/29/2019 140  135 - 145 mmol/L Final   Potassium 05/29/2019 3.7  3.5 - 5.1 mmol/L Final   Chloride 05/29/2019 115* 98 - 111 mmol/L Final   CO2 05/29/2019 21* 22 - 32 mmol/L Final   Glucose, Bld 05/29/2019 91  70 - 99 mg/dL Final   BUN 05/29/2019 10  6 - 20 mg/dL Final   Creatinine, Ser 05/29/2019 0.89  0.44 - 1.00 mg/dL Final   Calcium 05/29/2019 8.3* 8.9 - 10.3 mg/dL Final   GFR calc non Af Amer 05/29/2019 >60  >60 mL/min Final   GFR calc Af Amer 05/29/2019 >60  >60 mL/min Final   Anion gap 05/29/2019 4* 5 - 15 Final   Performed at [facility], [address] [place], [place] [address]   WBC 05/30/2019 8.1  4.0 - 10.5 K/uL Final   RBC 05/30/2019 3.95  3.87 - 5.11 MIL/uL Final   Hemoglobin 05/30/2019 11.2* 12.0 - 15.0 g/dL Final   HCT 05/30/2019 34.5* 36.0 - 46.0 % Final   MCV 05/30/2019 87.3  80.0 - 100.0 fL Final   MCH 05/30/2019 28.4  26.0 - 34.0 pg Final   MCHC 05/30/2019 32.5  30.0 - 36.0 g/dL Final   RDW 05/30/2019 13.7  11.5 - 15.5 %  Final   Platelets 05/30/2019 295  150 - 400 K/uL Final   nRBC 05/30/2019 0.0  0.0 - 0.2 % Final   Performed at [facility], [address] [place], [place] [address]   Prothrombin Time 05/30/2019 13.6  11.4 - 15.2 seconds Final   INR 05/30/2019 1.1  0.8 - 1.2 Final   Comment: (NOTE) INR goal varies based on device and disease states. Performed at [facility], [address] [place], [place] [address]     Assessment:  Madalynne Utech is a 43 y.o. 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 She is on Coumadin.  Work-up on 12/22/2018 included the following normal/negative labs: CBC with diff, lupus anticoagulant, protein C activity, and protein S activity/antigen.Normal labson 01/06/2019 included: Factor V Leiden, prothrombin gene mutation, anticardiolipin antibodies, and beta2-glycoprotein antibodies.  Chest CT angiogramon 12/20/2018 revealed a wedge-shaped opacity in the right lower lobelateral basal segment consistent with a pulmonary infarct. There was relative hypoenhancement within the associated segmental artery probablysecondary toa small pulmonary embolus, though it wasdifficult to assessgiven limitationsof beam attenuation caused by the patient's body habitus. There was a small right pleural effusion with associated atelectasis.  Bilateral lower extremity duplexon 12/21/2018 revealed no femoropopliteal and no calf DVT in the visualized calf veins.  AbdomenandpelvisCTon 01/16/2019 revealed no acute findings. There was a new 2 cm pleural-based nodule in posterior right lower lobe.  PET scanon 01/26/2019  revealed no hypermetabolic findings highly suspicious for a malignant process. The pleural-based right lung base pulmonary nodule demonstrated no significant metabolism, was slightly decreased from 01/16/2019 CT abdomen study, and was substantially decreased from 12/20/2018 chest CT angiogram study, most  compatible with a resolving pulmonary infarct.   She was admitted to [facility] from 05/26/2019 - 05/30/2019 for diverticulosis and a lower GI bleed. CTA abdomen and pelvis on 05/26/2019 showed scattered diverticulosis, large stool burden. Tagged RBC scan on 05/26/2019 showed no evidence of GI bleed. IVC filter was placed on 05/29/2019. EGD and colonoscopy on 05/27/2019 showed no evidence of active or recent bleeding.  The source of patient's bleeding was likely internal hemorrhoids.   Bilateral mammogram and left breast ultrasound on 06/26/2019 revealed a probably benign, 12 mm fibrocystic lesion at the 9:30 o'clock position, 7 cm the nipple.  Ultrasound revealed a cystic and solid lesion with echogenic foci consistent with calcifications. The appearance was consistent with a fibrocystic lesion. Short-term follow-up (diagnostic left mammogram and ultrasound) was recommended in 6 months.    Symptomatically, she notes some bruising on her legs.  Plan: 1.   Review interval labs. 2.   Recurrent pulmonary embolism Patientwithpulmonary embolism in 2006and2020. Work-up was negative. PET scan revealed no malignancy. She has an IVC filter in place.             She was to be on a Lovenox bridge with Coumadin at recent hospital discharge.                         She has been unable to obtain her Lovenox from the pharmacy.             She is currently taking Coumadin 10 mg 3x/week (Monday, Wednesday, Friday) and 7.5 mg 4x/week (Sunday, Tuesday, Thursday, Saturday).    Coumadin is being monitored by Dr. Iona Beard.             She previously took Coumadin 5 mg daily 6x/week and 7.5 mg on Tuesdays.             INR was 1.5.  She has a recheck on 07/05/2019. GoalINR is 2-3.  She notes bruising on her legs, but no bleeding. 3.Mammogram changes  Review interval bilateral mammogram and left breast ultrasound.   Cystic lesion likely benign.   Discuss  need for follow-up  Schedule left diagnostic mammogram and ultrasound on 12/26/2019. 4.   Upcoming gastric sleeve surgery  Patient notes plan for gastric sleeve surgery between 08/2019 - 10/2019.  Patient to call regarding planned surgery for Lovenox bridging. 5.   RTC after mammogram for MD assessment and labs (CBC with diff, PT/INR).  I discussed the assessment and treatment plan with the patient.  The patient was provided an opportunity to ask questions and all were answered.  The patient agreed with the plan and demonstrated an understanding of the instructions.  The patient was advised to call back or seek an in person evaluation if the symptoms worsen or if the condition fails to improve as anticipated.  I provided 20 minutes (2:14 PM - 2:34 PM) of face-to-face video visit time during this this encounter and > 50% was spent counseling as documented under my assessment and plan.  I provided these services from the [facility] office.   Nolon Stalls, MD, PhD  07/03/2019, 2:14 PM  I, Selena Batten, am acting as scribe for [employer]. Mike Gip, MD, PhD.  I, Jamye Balicki C. Mike Gip, MD, have reviewed  the above documentation for accuracy and completeness, and I agree with the above.

## 2019-07-03 ENCOUNTER — Encounter: Payer: Self-pay | Admitting: Hematology and Oncology

## 2019-07-03 ENCOUNTER — Inpatient Hospital Stay (HOSPITAL_BASED_OUTPATIENT_CLINIC_OR_DEPARTMENT_OTHER): Payer: Medicare Other | Admitting: Hematology and Oncology

## 2019-07-03 ENCOUNTER — Inpatient Hospital Stay: Payer: Medicare Other

## 2019-07-03 ENCOUNTER — Other Ambulatory Visit: Payer: Self-pay

## 2019-07-03 DIAGNOSIS — N6012 Diffuse cystic mastopathy of left breast: Secondary | ICD-10-CM

## 2019-07-03 DIAGNOSIS — I2699 Other pulmonary embolism without acute cor pulmonale: Secondary | ICD-10-CM | POA: Diagnosis not present

## 2019-07-03 NOTE — Progress Notes (Signed)
Patient states that here lately it is hard to hold her pee in. Especially when she sits up from a laying position. She wonders if it has something to do with her back which is giving her trouble now. Back pain down entire spine, 7/10. She having ne numbness and tingling in her hands and feet. Patient states that she is now taking phentermine and has lost 8 pounds.

## 2019-07-04 ENCOUNTER — Ambulatory Visit: Payer: Medicare Other | Admitting: Gastroenterology

## 2019-07-25 ENCOUNTER — Encounter: Payer: Self-pay | Admitting: Hematology and Oncology

## 2019-07-31 ENCOUNTER — Ambulatory Visit: Payer: Medicare Other | Admitting: Hematology and Oncology

## 2019-07-31 ENCOUNTER — Other Ambulatory Visit: Payer: Medicare Other

## 2019-08-08 ENCOUNTER — Encounter: Payer: Self-pay | Admitting: Hematology and Oncology

## 2019-08-10 ENCOUNTER — Telehealth: Payer: Self-pay

## 2019-08-10 NOTE — Telephone Encounter (Signed)
Spoke with the patient to inform her that she will need to follow up with dr Iona Beard office because he is the provider following her care. The patient was understanding and states they have her schedule for Monday to get PT & INR levels.

## 2019-08-13 ENCOUNTER — Encounter: Payer: Self-pay | Admitting: Hematology and Oncology

## 2019-08-15 ENCOUNTER — Telehealth: Payer: Self-pay | Admitting: *Deleted

## 2019-08-15 DIAGNOSIS — Z79899 Other long term (current) drug therapy: Secondary | ICD-10-CM | POA: Insufficient documentation

## 2019-08-15 DIAGNOSIS — G894 Chronic pain syndrome: Secondary | ICD-10-CM | POA: Insufficient documentation

## 2019-08-15 DIAGNOSIS — M899 Disorder of bone, unspecified: Secondary | ICD-10-CM | POA: Insufficient documentation

## 2019-08-15 DIAGNOSIS — Z789 Other specified health status: Secondary | ICD-10-CM | POA: Insufficient documentation

## 2019-08-15 NOTE — Progress Notes (Signed)
Patient's Name: Angela [name]  MRN: [id number]  Referring Provider: Sharyne Peach, MD  DOB: [birth year]  PCP: Angela Peach, MD  DOS: 08/16/2019  Note by: Angela Cola, MD  Service setting: Ambulatory outpatient  Specialty: Interventional Pain Management  Location: [facility] Virtual Visit  Visit type: Initial Patient Evaluation  Patient type: New Patient   Virtual Encounter - Pain Management PROVIDER NOTE: Information contained herein reflects review and annotations entered in association with encounter. Interpretation of such information and data should be left to medically-trained personnel. Information provided to patient can be located elsewhere in the medical record under "Patient Instructions". Document created using STT-dictation technology, any transcriptional errors that may result from process are unintentional.    Contact & Pharmacy Preferred: [phone] Home: [phone] (home) Mobile: [phone] (mobile) E-mail: [email or web address]  [id number] [id number]: [phone]: [phone]  Pre-screening note:  Our staff contacted Ms. Angela Carawayand offered her an "in person", "face-to-face" appointment versus a telephone encounter. She indicated preferring the telephone encounter, at this time.  Primary Reason(s) for Visit: Tele-Encounter for initial evaluation of one or more chronic problems (new to examiner) potentially causing chronic pain, and posing a threat to normal musculoskeletal function. (Level of risk: High) CC: Back Pain (entire back), Shoulder Pain (bilateral right is worse from fall and torn rotator cuff), Knee Pain (bilateral right knee is worse ), Hip Pain (left ), Foot Pain (left, plantar fasciatis ), Generalized Body Aches (fibromyalgia), and Hand Pain (raynodes)  I contacted Angela [name] 08/16/2019 via telephone.      I clearly identified myself as  FGaspar Cola MD. I verified that I was speaking with the correct person using two identifiers (Name: Angela [name] and date of birth: [birth year].  Advanced Informed Consent I sought verbal advanced consent from Angela Carryfor virtual visit interactions. I informed Ms. Angela Carawayof possible security and privacy concerns, risks, and limitations associated with providing "not-in-person" medical evaluation and management services. I also informed Ms. Angela Carawayof the availability of "in-person" appointments. Finally, I informed her that there would be a charge for the virtual visit and that she could be  personally, fully or partially, financially responsible for it. Ms. Angela Carawayexpressed understanding and agreed to proceed.   HPI  Ms. Angela Carawayis a 44y.o. year old, female patient, contacted today for an initial evaluation of her chronic pain. She has Lump or mass in breast; Mass of breast, right; Antineutrophil cytoplasmic antibody (ANCA) positive; Anxiety state; Clinical depression; Essential (primary) hypertension; Cephalalgia; Cystic breast, left; Fibroid; Headache, migraine; Muscle ache; Rheumatoid arthritis of knee ([place]; Benign intracranial hypertension; Increased prolactin level; Other shoulder lesions, unspecified shoulder; Nipple discharge in female; Pulmonary embolism with infarction ([place]; Chronic anticoagulation (Coumadin); Pleural nodule; Right lower lobe pulmonary nodule; Weight gain; Chronic venous insufficiency; Lymphedema; Asthma, mild; Bacterial infection; Calculus of kidney; Cardiomegaly; Chronic sinusitis; Fatigue; Galactorrhea (CODE); Herpes simplex infection of genitourinary system; Hyperlipidemia; Hyperprolactinemia ([place]; Hypothyroidism; Irritable bowel syndrome without diarrhea; Morbid obesity with body mass index of 45.0-49.9 in adult ([facility]; Non-seasonal allergic rhinitis due to pollen; Obesity, morbid ([place]; Pain in or around eye; Raynaud's disease; Recurrent pulmonary embolism  ([place]; 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[place]; Ringing in ear; Rotator cuff tendinitis; Stuffy nose; Urinary tract infection; Uveitis; GI bleed; Stomach irritation; Melena; Hematochezia; Internal hemorrhoids; Diverticulosis of large intestine without diverticulitis; Acquired absence of other genital organ(s); Post-traumatic  stress disorder, unspecified; Single liveborn infant, delivered by cesarean; Status post tonsillectomy; Depression; Chronic pain syndrome; Pharmacologic therapy; Disorder of skeletal system; Problems influencing health status; Chronic low back pain (Primary Area of Pain) (Bilateral) (L>R) w/o sciatica; Chronic upper back pain (Secondary area of Pain) (Midline); Chronic thoracic spine pain; Osteoarthritis involving multiple joints; Fibromyalgia; Chronic musculoskeletal pain; Chronic knee pain (Third area of Pain) (Bilateral) (R>L); Cervicalgia; Cervicogenic headache; Chronic lower extremity pain (Fourth area of Pain) (Bilateral) (L>R); Chronic shoulder pain (Fifth area of Pain) (Bilateral); and Chronic generalized pain on their problem list.   Onset and Duration: Date of onset: 8 years ago Cause of pain: Unknown Severity: Getting worse, NAS-11 at its worse: 10/10, NAS-11 at its best: 5/10, NAS-11 now: 7/10 and NAS-11 on the average: 6/10 Timing: During activity or exercise and After a period of immobility Aggravating Factors: Motion, Prolonged sitting and Walking Alleviating Factors: Medications, Walking and position changes Associated Problems: Inability to control bladder (urine), Weakness, Pain that wakes patient up and Pain that does not allow patient to sleep Quality of Pain: Burning, Constant, Feeling of constriction, Shooting, Throbbing and Uncomfortable Previous Examinations or Tests: The patient denies states seeing spine specialist in [place] Previous Treatments: Epidural steroid injections  According to the patient she is transferring from a pain clinic in  [place], [place].  The patient indicates that she thinks the name of the practice is [facility]" [phone].    According to her her primary area of pain is that of the lower back, bilaterally, with the left being worse than the right.  She denies any back surgery but does admit to having had 1 lumbar epidural steroid injection which she described as not having helped.  This was done around January 2019.    The patient secondary area pain is that of the upper back, midline, which she describes as pain around her thoracic spine.  Once again she denies any surgery, nerve blocks, or recent x-rays of the area.  The patient's third area pain is that of the knees, bilaterally, with the right being worse than the left.  She denies any type of surgeries, joint or nerve blocks, or any recent x-rays.  The patient's fourth area pain is that of the lower extremities, bilaterally, with the left being worse than the right.  Again, she denies any type of surgeries or traumas to the area.  In the case of the left lower extremity pain goes below the knee to the lateral aspect of the leg but it does not go further than the ankle.  In the case of the right lower extremity pain, it goes down to the area just below the knee (shin area) and she describes this pain as a deep bone ache.  In addition to this, the patient refers having generalized joint pain and she describes those joints as being primarily in the area of both knees, and both shoulders.  She describes having fibromyalgia, generalized pain as well as joint pain secondary to degenerative joint disease.  Prior to moving to this area the patient had been referred to physical therapy for aquatic therapy but unfortunately she had to stop after her first session due to COVID-19.  The patient's next area of pain is that of the neck (cervicalgia) with headaches that seem to be of different origin.  She describes headaches that are in the back of  the head that usually are associated with her neck pain, she also describes migraine headaches as well as  headaches that she attributes to her increased intracranial pressure.  She has a neurologist at [place] (Dr. Alverda Skeans) that is managing the headaches.  In terms of her pharmacotherapy, she describes occasionally taking some tramadol.  According to the PMP her last prescription on that was filled on 05/30/2023 number 12 pills to be taken 1 every 6 hours as needed.  In addition to this, she had been prescribed [place] 150 mcg film, apparently from the [place] pain clinic.  The patient also has a history significant for endocrine problems.  Historic Controlled Substance Pharmacotherapy Review  Current opioid analgesics:  Tramadol 50 mg, 1 tab PO q 6 Hrs PRN (200 mg/day of tramadol) Highest recorded MME/day: 50 mg/day MME/day: 20 mg/day    Historical Monitoring: The patient  reports no history of drug use. List of all UDS Test(s): No results found. List of other Serum/Urine Drug Screening Test(s):  No results found. Historical Background Evaluation: [place] PMP: PDMP reviewed during this encounter. Two (2) year initial data search conducted.             PMP NARX Score Report:  Narcotic: 180 Sedative: 090 Stimulant: 000 Risk Assessment Profile: PMP NARX Overdose Risk Score: 290  Pharmacologic Plan: As per protocol, I have not taken over any controlled substance management, pending the results of ordered tests and/or consults.            Initial impression: Pending review of available data and ordered tests.  Meds   Current Outpatient Medications:  .  acetaminophen (TYLENOL) 500 MG tablet, Take 1,000-1,500 mg by mouth every 6 (six) hours as needed for moderate pain or headache., Disp: , Rfl:  .  albuterol (PROVENTIL HFA;VENTOLIN HFA) 108 (90 BASE) MCG/ACT inhaler, Inhale 2 puffs into the lungs every 6 (six) hours as needed for wheezing., Disp: , Rfl:  .  Carboxymethylcellulose  Sodium (REFRESH LIQUIGEL OP), Place 1 drop into both eyes daily as needed (dry eyes)., Disp: , Rfl:  .  cephALEXin (KEFLEX) 500 MG capsule, Take 500 mg by mouth daily., Disp: , Rfl:  .  clindamycin (CLEOCIN T) 1 % lotion, Apply 1 application topically 2 (two) times daily as needed (acne)., Disp: , Rfl:  .  cyclobenzaprine (FLEXERIL) 10 MG tablet, Take 1 tablet (10 mg total) by mouth as needed. (Patient taking differently: Take 10 mg by mouth 3 (three) times daily as needed for muscle spasms. ), Disp: 30 tablet, Rfl: 3 .  Difluprednate (DUREZOL) 0.05 % EMUL, Place 1 drop into both eyes every 6 (six) hours as needed (uveitis). , Disp: , Rfl:  .  diphenhydrAMINE (BENADRYL) 25 mg capsule, Take 25 mg by mouth 2 (two) times daily as needed for allergies or sleep. , Disp: , Rfl:  .  EPINEPHrine (EPIPEN 2-PAK) 0.3 mg/0.3 mL IJ SOAJ injection, Inject 0.3 mg into the muscle as needed for anaphylaxis. , Disp: , Rfl:  .  fluticasone (FLONASE) 50 MCG/ACT nasal spray, Place 1 spray into both nostrils daily as needed for rhinitis., Disp: , Rfl:  .  Galcanezumab-gnlm 120 MG/ML SOAJ, Inject 120 mg into the skin every 28 (twenty-eight) days. , Disp: , Rfl:  .  levothyroxine (SYNTHROID) 150 MCG tablet, Take 150 mcg by mouth daily before breakfast. , Disp: , Rfl:  .  linaclotide (LINZESS) 72 MCG capsule, Take 72 mcg by mouth daily as needed (constipation)., Disp: , Rfl:  .  Melatonin 3 MG CAPS, Take 3 mg by mouth at bedtime as needed (sleep)., Disp: , Rfl:  .  montelukast (SINGULAIR) 10 MG tablet, Take 10 mg by mouth at bedtime., Disp: , Rfl:  .  Multiple Vitamin (MULTIVITAMIN WITH MINERALS) TABS tablet, Take 1 tablet by mouth daily., Disp: , Rfl:  .  ondansetron (ZOFRAN) 8 MG tablet, Take 8 mg by mouth every 8 (eight) hours as needed for nausea or vomiting., Disp: , Rfl:  .  oxybutynin (DITROPAN) 5 MG tablet, Take 5 mg by mouth at bedtime., Disp: , Rfl:  .  pantoprazole (PROTONIX) 40 MG tablet, Take by mouth., Disp: ,  Rfl:  .  phentermine (ADIPEX-P) 37.5 MG tablet, Take by mouth. 0.5 tablet, Disp: , Rfl:  .  prednisoLONE acetate (PRED FORTE) 1 % ophthalmic suspension, Place 1 drop into both eyes every 4 (four) hours as needed (uveitis). , Disp: , Rfl:  .  promethazine (PHENERGAN) 25 MG tablet, Take 25 mg by mouth every 6 (six) hours as needed for nausea or vomiting., Disp: , Rfl:  .  rosuvastatin (CRESTOR) 20 MG tablet, Take 20 mg by mouth daily., Disp: , Rfl:  .  sodium fluoride (DENTAGEL) 1.1 % GEL dental gel, Place 1 application onto teeth at bedtime. , Disp: , Rfl:  .  tiZANidine (ZANAFLEX) 4 MG tablet, Take 4 mg by mouth every 12 (twelve) hours as needed for muscle spasms., Disp: , Rfl:  .  topiramate (TOPAMAX) 50 MG tablet, Take 50 mg by mouth 2 (two) times daily., Disp: , Rfl:  .  traMADol (ULTRAM) 50 MG tablet, Take 1 tablet (50 mg total) by mouth every 6 (six) hours as needed for moderate pain., Disp: 12 tablet, Rfl: 0 .  warfarin (COUMADIN) 7.5 MG tablet, Take 1 tablet (7.5 mg total) by mouth daily at 6 PM for 2 days. (Patient taking differently: Take 7.5 mg by mouth daily at 6 PM. 3 days a week 10 mg , then rest of the week 7.5 mg), Disp: 2 tablet, Rfl: 0  ROS  Cardiovascular: High blood pressure and Blood thinners:  Anticoagulant Pulmonary or Respiratory: Lung problems and Wheezing and difficulty taking a deep full breath (Asthma) Neurological: No reported neurological signs or symptoms such as seizures, abnormal skin sensations, urinary and/or fecal incontinence, being born with an abnormal open spine and/or a tethered spinal cord Review of Past Neurological Studies: No results found for this or any previous visit. Psychological-Psychiatric: Anxiousness and Depressed Gastrointestinal: Vomiting blood (Ulcers) and Alternating episodes iof diarrhea and constipation (IBS-Irritable bowe syndrome) Genitourinary: Difficulty emptying the bladder or controlling the flow of urine (Neurogenic  bladder) Hematological: Abnormal red blood cell shape problems (Sickle Cell disease or trait) and Coagulation disorder Endocrine: thyroid disease Rheumatologic: Joint aches and or swelling due to excess weight (Osteoarthritis) and Generalized muscle aches (Fibromyalgia) Musculoskeletal: Negative for myasthenia gravis, muscular dystrophy, multiple sclerosis or malignant hyperthermia Work History: Disabled since 2016  Allergies  Angela [name] is allergic to amoxicillin; cherry; lisinopril; nortriptyline; orange fruit [citrus]; other; and penicillins.  Laboratory Chemistry Profile   Screening Lab Results  Component Value Date   SARSCOV2NAA NEGATIVE 05/26/2019   HIV NON REACTIVE 05/27/2019   PREGTESTUR NEGATIVE 12/20/2018    Inflammation (CRP: Acute Phase) (ESR: Chronic Phase) No results found.  Rheumatology No results found.  Renal Lab Results  Component Value Date   BUN 10 05/29/2019   CREATININE 0.89 05/29/2019   GFRAA >60 05/29/2019   GFRNONAA >60 05/29/2019  Hepatic Lab Results  Component Value Date   AST 13 (L) 05/26/2019   ALT 13 05/26/2019   ALBUMIN 3.9 05/26/2019   ALKPHOS 44 05/26/2019   LIPASE 31 12/20/2018                        Electrolytes Lab Results  Component Value Date   NA 140 05/29/2019   K 3.7 05/29/2019   CL 115 (H) 05/29/2019   CALCIUM 8.3 (L) 05/29/2019   MG 2.2 12/23/2018                        Neuropathy Lab Results  Component Value Date   HIV NON REACTIVE 05/27/2019                        CNS No results found.  Bone No results found.  Coagulation Lab Results  Component Value Date   INR 1.1 05/30/2019   LABPROT 13.6 05/30/2019   APTT 66 (H) 02/06/2019   PLT 295 05/30/2019                        Cardiovascular Lab Results  Component Value Date   BNP 33.0 02/06/2019   TROPONINI <0.03 12/23/2018   HGB 11.2 (L) 05/30/2019   HCT 34.5 (L) 05/30/2019                         ID Lab Results   Component Value Date   HIV NON REACTIVE 05/27/2019   [place] NEGATIVE 05/26/2019   PREGTESTUR NEGATIVE 12/20/2018   MICROTEXT  03/22/2013       COMMENT                   NO GROWTH IN 3 DAYS   GRAM STAIN                NO WHITE BLOOD CELLS   GRAM STAIN                NO ORGANISMS SEEN   ANTIBIOTIC                                                        Cancer No results found.  Endocrine Lab Results  Component Value Date   TSH 3.967 12/21/2018                        Note: Lab results reviewed.  Imaging Review   Complexity Note: No results found under the [facility] electronic medical record.                         [place]  Drug: Angela [name]  reports no history of drug use. Alcohol:  reports current alcohol use. Tobacco:  reports that she has never smoked. She has never used smokeless tobacco. Medical:  has a past medical history of Anemia, Anxiety, Asthma, Depression, Fibromyalgia, GERD (gastroesophageal reflux disease), Hypertension, Irritable bowel syndrome (IBS), Pseudotumor cerebri, Pulmonary embolism ([place]), Raynaud's disease, Thyroid disease, and Vision disturbance. Family: family history includes Breast cancer in her maternal aunt; Cancer in her maternal aunt and maternal grandmother; Depression in her mother; Diabetes in  her father; Diverticulitis in her mother; Heart disease in her father; Hypertension in her father and mother; Migraines in her mother; Stroke in her sister.  Past Surgical History:  Procedure Laterality Date  . ABDOMINAL HYSTERECTOMY  02/06/13  . BREAST BIOPSY Right October, 2014   right breast core biopsy, fibroadenomatous changes  . BREAST BIOPSY Right December 2014   FNA retroareolar nodule consistent with fibroadenoma.  Marland Kitchen BREAST BIOPSY Right 12/16/2016   Fibroadenoma in the retroareolar area at 6:00.  Marland Kitchen [place]  . COLONOSCOPY  2015   Dr Allen Norris  . COLONOSCOPY WITH PROPOFOL N/A 05/27/2019   Procedure: COLONOSCOPY  WITH PROPOFOL;  Surgeon: Virgel Manifold, MD;  Location: [facility] ENDOSCOPY;  Service: Endoscopy;  Laterality: N/A;  . ESOPHAGOGASTRODUODENOSCOPY (EGD) WITH PROPOFOL N/A 05/27/2019   Procedure: ESOPHAGOGASTRODUODENOSCOPY (EGD) WITH PROPOFOL;  Surgeon: Virgel Manifold, MD;  Location: [facility] ENDOSCOPY;  Service: Endoscopy;  Laterality: N/A;  . IVC FILTER INSERTION N/A 05/29/2019   Procedure: IVC FILTER INSERTION;  Surgeon: Algernon Huxley, MD;  Location: [place] [facility] LAB;  Service: Cardiovascular;  Laterality: N/A;  . RHINOPLASTY    . TONSILECTOMY, ADENOIDECTOMY, BILATERAL MYRINGOTOMY AND TUBES  2004  . UPPER GASTROINTESTINAL ENDOSCOPY  2015   Dr Allen Norris   Active Ambulatory Problems    Diagnosis Date Noted  . Lump or mass in breast 05/29/2013  . Mass of breast, right 05/24/2014  . 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 Anxiety state 06/12/2011  . Clinical depression 06/18/2015  . Essential (primary) hypertension 04/26/2013  . Cephalalgia 04/26/2013  . Cystic breast, left 05/29/2013  . Fibroid 04/26/2013  . Headache, migraine 06/18/2015  . Muscle ache 04/26/2013  . Rheumatoid arthritis of knee ([place]) 10/09/2014  . Benign intracranial hypertension 12/08/2012  . Increased prolactin level 12/08/2012  . Other shoulder lesions, unspecified shoulder 07/22/2015  . Nipple discharge in female 11/27/2016  . Pulmonary embolism with infarction ([place]) 12/20/2018  . Chronic anticoagulation (Coumadin) 01/06/2019  . Pleural nodule 01/19/2019  . Right lower lobe pulmonary nodule 01/30/2019  . Weight gain 01/30/2019  . Chronic venous insufficiency 05/22/2019  . Lymphedema 05/22/2019  . Asthma, mild 02/20/2016  . Bacterial infection 04/26/2013  . Calculus of kidney 02/20/2015  . Cardiomegaly 03/12/2019  . Chronic sinusitis 02/20/2015  . Fatigue 01/25/2018  . Galactorrhea (CODE) 05/31/2018  . Herpes simplex infection of genitourinary system 12/20/2015  . Hyperlipidemia  01/25/2018  . Hyperprolactinemia ([place]) 01/25/2018  . Hypothyroidism 01/25/2018  . Irritable bowel syndrome without diarrhea 01/25/2018  . Morbid obesity with body mass index of 45.0-49.9 in adult [facility]) 12/20/2015  . Non-seasonal allergic rhinitis due to pollen 12/20/2015  . Obesity, morbid ([place]) 01/25/2018  . Pain in or around eye 04/26/2013  . Raynaud's disease 01/25/2018  . 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[place]) 12/26/2018  . 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[place]) 10/09/2014  . Ringing in ear 02/20/2015  . Rotator cuff tendinitis 07/22/2015  . Stuffy nose 02/20/2015  . Urinary tract infection 04/26/2013  . Uveitis 05/22/2019  . GI bleed 05/26/2019  . Stomach irritation   . Melena   . Hematochezia   . Internal hemorrhoids   . Diverticulosis of large intestine without diverticulitis   . Acquired absence of other genital organ(s) 01/25/2018  . Post-traumatic stress disorder, unspecified 01/25/2018  . Single liveborn infant, delivered by cesarean 01/25/2018  . Status post tonsillectomy 01/25/2018  . Depression 01/25/2018  . Chronic pain syndrome 08/15/2019  . Pharmacologic therapy 08/15/2019  .  Disorder of skeletal system 08/15/2019  . Problems influencing health status 08/15/2019  . Chronic low back pain (Primary Area of Pain) (Bilateral) (L>R) w/o sciatica 08/16/2019  . Chronic upper back pain (Secondary area of Pain) (Midline) 08/16/2019  . Chronic thoracic spine pain 08/16/2019  . Osteoarthritis involving multiple joints 08/16/2019  . Fibromyalgia 08/16/2019  . Chronic musculoskeletal pain 08/16/2019  . Chronic knee pain (Third area of Pain) (Bilateral) (R>L) 08/16/2019  . Cervicalgia 08/16/2019  . Cervicogenic headache 08/16/2019  . Chronic lower extremity pain (Fourth area of Pain) (Bilateral) (L>R) 08/16/2019  . Chronic shoulder pain (Fifth area of Pain) (Bilateral) 08/16/2019  . Chronic generalized pain 08/16/2019   Resolved Ambulatory Problems    Diagnosis  Date Noted  . No Resolved Ambulatory Problems   Past Medical History:  Diagnosis Date  . Anemia   . Anxiety   . Asthma   . GERD (gastroesophageal reflux disease)   . Hypertension   . Irritable bowel syndrome (IBS)   . Pseudotumor cerebri   . Pulmonary embolism ([place])   . Thyroid disease   . Vision disturbance    Assessment  Primary Diagnosis & Pertinent Problem List: The primary encounter diagnosis was Chronic pain syndrome. Diagnoses of Chronic low back pain (Primary Area of Pain) (Bilateral) (L>R) w/o sciatica, Chronic upper back pain (Secondary area of Pain) (Midline), Chronic thoracic spine pain, Osteoarthritis involving multiple joints, Fibromyalgia, Chronic musculoskeletal pain, Chronic knee pain (Third area of Pain) (Bilateral) (R>L), Cervicalgia, Cervicogenic headache, Chronic lower extremity pain (Fourth area of Pain) (Bilateral) (L>R), Chronic shoulder pain (Fifth area of Pain) (Bilateral), Chronic generalized pain, Pharmacologic therapy, Disorder of skeletal system, and Problems influencing health status were also pertinent to this visit.  Visit Diagnosis (New problems to examiner): 1. Chronic pain syndrome   2. Chronic low back pain (Primary Area of Pain) (Bilateral) (L>R) w/o sciatica   3. Chronic upper back pain (Secondary area of Pain) (Midline)   4. Chronic thoracic spine pain   5. Osteoarthritis involving multiple joints   6. Fibromyalgia   7. Chronic musculoskeletal pain   8. Chronic knee pain (Third area of Pain) (Bilateral) (R>L)   9. Cervicalgia   10. Cervicogenic headache   11. Chronic lower extremity pain (Fourth area of Pain) (Bilateral) (L>R)   12. Chronic shoulder pain (Fifth area of Pain) (Bilateral)   13. Chronic generalized pain   14. Pharmacologic therapy   15. Disorder of skeletal system   16. Problems influencing health status    Plan of Care (Initial workup plan)  Note: Angela [name] was reminded that as per protocol, today's visit has been an  evaluation only. We have not taken over the patient's controlled substance management.  Problem-specific plan: No problem-specific Assessment & Plan notes found for this encounter.   Lab Orders     UDS (Comprehensive-24) ([facility]) ([facility]) (New Pt.)     Magnesium     Vitamin B12     Sedimentation rate     Vitamin D (D2+D3)     CRP  Imaging Orders     DG Cervical Spine With Flex & Extend     DG Lumbar Spine Complete w/ Flex/Ext (6 Views)     DG Thoracic Spine (X-ray)     DG Shoulder Right     DG Shoulder Left     DG Knee (Right)     DG Knee (Left) Referral Orders  No referral(s) requested today   Procedure Orders    No procedure(s) ordered today  Pharmacotherapy (current): Medications ordered:  No orders of the defined types were placed in this encounter.  Medications administered during this visit: Angela [name] "[place]" had no medications administered during this visit.   Pharmacological management options:  Opioid Analgesics: The patient was informed that there is no guarantee that she would be a candidate for opioid analgesics. The decision will be made following CDC guidelines. This decision will be based on the results of diagnostic studies, as well as Ms. [name]'s risk profile.   Membrane stabilizer: To be determined at a later time  Muscle relaxant: To be determined at a later time  NSAID: To be determined at a later time  Other analgesic(s): To be determined at a later time   Interventional management options: Angela [name] was informed that there is no guarantee that she would be a candidate for interventional therapies. The decision will be based on the results of diagnostic studies, as well as Ms. [name]'s risk profile.  Procedure(s) under consideration:  Diagnostic bilateral lumbar facet blocks  Possible midline LESI at the L3-4/L4-5 level.  Diagnostic right intra-articular knee joint injection with local anesthetic and steroid  Possible series of 5  intra-articular Hyalgan knee injections    Provider-requested follow-up: Return for (VV), (s/p Tests).  Future Appointments  Date Time Provider [place]  08/30/2019 10:30 AM [facility] None  08/30/2019 11:00 AM Lequita Asal, MD [facility] None  08/31/2019  9:30 AM Lucilla Lame, MD [facility] None  12/26/2019  2:00 PM [facility]  12/26/2019  2:20 PM [facility] [place] [facility]    Total duration of non-face-to-face encounter: 35 minutes.  Primary Care Physician: Angela Peach, MD Location: [facility] Note by: Angela Cola, MD Date: 08/16/2019; Time: 12:57 PM  Note: This dictation was prepared with Dragon dictation. Any transcriptional errors that may result from this process are unintentional.

## 2019-08-16 ENCOUNTER — Other Ambulatory Visit: Payer: Self-pay

## 2019-08-16 ENCOUNTER — Ambulatory Visit: Payer: Medicare Other | Attending: Pain Medicine | Admitting: Pain Medicine

## 2019-08-16 DIAGNOSIS — Z789 Other specified health status: Secondary | ICD-10-CM

## 2019-08-16 DIAGNOSIS — G4486 Cervicogenic headache: Secondary | ICD-10-CM

## 2019-08-16 DIAGNOSIS — Z79899 Other long term (current) drug therapy: Secondary | ICD-10-CM

## 2019-08-16 DIAGNOSIS — M159 Polyosteoarthritis, unspecified: Secondary | ICD-10-CM

## 2019-08-16 DIAGNOSIS — M79605 Pain in left leg: Secondary | ICD-10-CM

## 2019-08-16 DIAGNOSIS — M25511 Pain in right shoulder: Secondary | ICD-10-CM

## 2019-08-16 DIAGNOSIS — M549 Dorsalgia, unspecified: Secondary | ICD-10-CM

## 2019-08-16 DIAGNOSIS — M150 Primary generalized (osteo)arthritis: Secondary | ICD-10-CM

## 2019-08-16 DIAGNOSIS — M7918 Myalgia, other site: Secondary | ICD-10-CM

## 2019-08-16 DIAGNOSIS — M546 Pain in thoracic spine: Secondary | ICD-10-CM

## 2019-08-16 DIAGNOSIS — M25512 Pain in left shoulder: Secondary | ICD-10-CM

## 2019-08-16 DIAGNOSIS — G894 Chronic pain syndrome: Secondary | ICD-10-CM | POA: Diagnosis not present

## 2019-08-16 DIAGNOSIS — M25561 Pain in right knee: Secondary | ICD-10-CM

## 2019-08-16 DIAGNOSIS — R519 Headache, unspecified: Secondary | ICD-10-CM

## 2019-08-16 DIAGNOSIS — M545 Low back pain, unspecified: Secondary | ICD-10-CM

## 2019-08-16 DIAGNOSIS — M8949 Other hypertrophic osteoarthropathy, multiple sites: Secondary | ICD-10-CM

## 2019-08-16 DIAGNOSIS — M542 Cervicalgia: Secondary | ICD-10-CM

## 2019-08-16 DIAGNOSIS — M25562 Pain in left knee: Secondary | ICD-10-CM

## 2019-08-16 DIAGNOSIS — M899 Disorder of bone, unspecified: Secondary | ICD-10-CM

## 2019-08-16 DIAGNOSIS — R52 Pain, unspecified: Secondary | ICD-10-CM

## 2019-08-16 DIAGNOSIS — M79604 Pain in right leg: Secondary | ICD-10-CM

## 2019-08-16 DIAGNOSIS — M797 Fibromyalgia: Secondary | ICD-10-CM

## 2019-08-16 DIAGNOSIS — G8929 Other chronic pain: Secondary | ICD-10-CM

## 2019-08-16 NOTE — Patient Instructions (Signed)
____________________________________________________________________________________________  Blood Thinners  IMPORTANT NOTICE:  If you take any of these, make sure to notify the nursing staff.  Failure to do so may result in injury.  Recommended time intervals to stop and restart blood-thinners, before & after invasive procedures  Generic Name Brand Name Stop Time. Must be stopped at least this long before procedures. After procedures, wait at least this long before re-starting.  Abciximab Reopro 15 days 2 hrs  Alteplase Activase 10 days 10 days  Anagrelide Agrylin    Apixaban Eliquis 3 days 6 hrs  Cilostazol Pletal 3 days 5 hrs  Clopidogrel Plavix 7-10 days 2 hrs  Dabigatran Pradaxa 5 days 6 hrs  Dalteparin Fragmin 24 hours 4 hrs  Dipyridamole Aggrenox 11days 2 hrs  Edoxaban Lixiana; Savaysa 3 days 2 hrs  Enoxaparin  Lovenox 24 hours 4 hrs  Eptifibatide Integrillin 8 hours 2 hrs  Fondaparinux  Arixtra 72 hours 12 hrs  Prasugrel Effient 7-10 days 6 hrs  Reteplase Retavase 10 days 10 days  Rivaroxaban Xarelto 3 days 6 hrs  Ticagrelor Brilinta 5-7 days 6 hrs  Ticlopidine Ticlid 10-14 days 2 hrs  Tinzaparin Innohep 24 hours 4 hrs  Tirofiban Aggrastat 8 hours 2 hrs  Warfarin Coumadin 5 days 2 hrs   Other medications with blood-thinning effects  Product indications Generic (Brand) names Note  Cholesterol Lipitor Stop 4 days before procedure  Blood thinner (injectable) Heparin (LMW or LMWH Heparin) Stop 24 hours before procedure  Cancer Ibrutinib (Imbruvica) Stop 7 days before procedure  Malaria/Rheumatoid Hydroxychloroquine (Plaquenil) Stop 11 days before procedure  Thrombolytics  10 days before or after procedures   Over-the-counter (OTC) Products with blood-thinning effects  Product Common names Stop Time  Aspirin > 325 mg Goody Powders, Excedrin, etc. 11 days  Aspirin ? 81 mg  7 days  Fish oil  4 days  Garlic supplements  7 days  Ginkgo biloba  36 hours  Ginseng  24  hours  NSAIDs Ibuprofen, Naprosyn, etc. 3 days  Vitamin E  4 days   ____________________________________________________________________________________________   

## 2019-08-24 ENCOUNTER — Encounter: Payer: Self-pay | Admitting: Hematology and Oncology

## 2019-08-25 NOTE — Telephone Encounter (Signed)
She already has an appt on 1/20 for lab/ see Md

## 2019-08-29 ENCOUNTER — Ambulatory Visit: Payer: Medicare Other | Attending: Internal Medicine

## 2019-08-29 ENCOUNTER — Encounter: Payer: Self-pay | Admitting: Hematology and Oncology

## 2019-08-29 DIAGNOSIS — Z20822 Contact with and (suspected) exposure to covid-19: Secondary | ICD-10-CM

## 2019-08-30 ENCOUNTER — Inpatient Hospital Stay: Payer: Medicare Other | Attending: Hematology and Oncology | Admitting: Hematology and Oncology

## 2019-08-30 ENCOUNTER — Inpatient Hospital Stay: Payer: Medicare Other

## 2019-08-30 DIAGNOSIS — R519 Headache, unspecified: Secondary | ICD-10-CM | POA: Insufficient documentation

## 2019-08-30 DIAGNOSIS — Z803 Family history of malignant neoplasm of breast: Secondary | ICD-10-CM | POA: Insufficient documentation

## 2019-08-30 DIAGNOSIS — F419 Anxiety disorder, unspecified: Secondary | ICD-10-CM | POA: Insufficient documentation

## 2019-08-30 DIAGNOSIS — I7300 Raynaud's syndrome without gangrene: Secondary | ICD-10-CM | POA: Insufficient documentation

## 2019-08-30 DIAGNOSIS — G4700 Insomnia, unspecified: Secondary | ICD-10-CM | POA: Insufficient documentation

## 2019-08-30 DIAGNOSIS — Z7901 Long term (current) use of anticoagulants: Secondary | ICD-10-CM | POA: Insufficient documentation

## 2019-08-30 DIAGNOSIS — I10 Essential (primary) hypertension: Secondary | ICD-10-CM | POA: Insufficient documentation

## 2019-08-30 DIAGNOSIS — E039 Hypothyroidism, unspecified: Secondary | ICD-10-CM | POA: Insufficient documentation

## 2019-08-30 DIAGNOSIS — K219 Gastro-esophageal reflux disease without esophagitis: Secondary | ICD-10-CM | POA: Insufficient documentation

## 2019-08-30 DIAGNOSIS — Z79899 Other long term (current) drug therapy: Secondary | ICD-10-CM | POA: Insufficient documentation

## 2019-08-30 DIAGNOSIS — Z86711 Personal history of pulmonary embolism: Secondary | ICD-10-CM | POA: Insufficient documentation

## 2019-08-30 DIAGNOSIS — R918 Other nonspecific abnormal finding of lung field: Secondary | ICD-10-CM | POA: Insufficient documentation

## 2019-08-30 DIAGNOSIS — M797 Fibromyalgia: Secondary | ICD-10-CM | POA: Insufficient documentation

## 2019-08-30 DIAGNOSIS — D649 Anemia, unspecified: Secondary | ICD-10-CM | POA: Insufficient documentation

## 2019-08-30 LAB — NOVEL CORONAVIRUS, NAA: SARS-CoV-2, NAA: NOT DETECTED

## 2019-08-31 ENCOUNTER — Other Ambulatory Visit: Payer: Self-pay

## 2019-08-31 ENCOUNTER — Encounter: Payer: Self-pay | Admitting: Hematology and Oncology

## 2019-08-31 ENCOUNTER — Ambulatory Visit: Payer: Medicare Other | Admitting: Gastroenterology

## 2019-08-31 NOTE — Progress Notes (Signed)
Confirmed Name and DOB. Denies any concerns.  

## 2019-09-01 ENCOUNTER — Inpatient Hospital Stay: Payer: Medicare Other | Admitting: Hematology and Oncology

## 2019-09-01 ENCOUNTER — Inpatient Hospital Stay: Payer: Medicare Other

## 2019-09-04 NOTE — Progress Notes (Signed)
This encounter was created in error - please disregard.

## 2019-09-06 ENCOUNTER — Inpatient Hospital Stay: Payer: Medicare Other

## 2019-09-06 ENCOUNTER — Encounter: Payer: Self-pay | Admitting: Hematology and Oncology

## 2019-09-06 ENCOUNTER — Inpatient Hospital Stay (HOSPITAL_BASED_OUTPATIENT_CLINIC_OR_DEPARTMENT_OTHER): Payer: Medicare Other | Admitting: Hematology and Oncology

## 2019-09-06 ENCOUNTER — Other Ambulatory Visit: Payer: Self-pay

## 2019-09-06 ENCOUNTER — Telehealth: Payer: Self-pay

## 2019-09-06 VITALS — BP 153/99 | HR 82 | Temp 98.0°F | Resp 20 | Wt 297.8 lb

## 2019-09-06 DIAGNOSIS — E039 Hypothyroidism, unspecified: Secondary | ICD-10-CM | POA: Diagnosis not present

## 2019-09-06 DIAGNOSIS — N6322 Unspecified lump in the left breast, upper inner quadrant: Secondary | ICD-10-CM | POA: Diagnosis not present

## 2019-09-06 DIAGNOSIS — R911 Solitary pulmonary nodule: Secondary | ICD-10-CM

## 2019-09-06 DIAGNOSIS — N63 Unspecified lump in unspecified breast: Secondary | ICD-10-CM

## 2019-09-06 DIAGNOSIS — F419 Anxiety disorder, unspecified: Secondary | ICD-10-CM | POA: Diagnosis not present

## 2019-09-06 DIAGNOSIS — I2699 Other pulmonary embolism without acute cor pulmonale: Secondary | ICD-10-CM

## 2019-09-06 DIAGNOSIS — I10 Essential (primary) hypertension: Secondary | ICD-10-CM | POA: Diagnosis not present

## 2019-09-06 DIAGNOSIS — Z79899 Other long term (current) drug therapy: Secondary | ICD-10-CM | POA: Diagnosis not present

## 2019-09-06 DIAGNOSIS — G4700 Insomnia, unspecified: Secondary | ICD-10-CM | POA: Diagnosis not present

## 2019-09-06 DIAGNOSIS — Z803 Family history of malignant neoplasm of breast: Secondary | ICD-10-CM | POA: Diagnosis not present

## 2019-09-06 DIAGNOSIS — M797 Fibromyalgia: Secondary | ICD-10-CM | POA: Diagnosis not present

## 2019-09-06 DIAGNOSIS — Z7901 Long term (current) use of anticoagulants: Secondary | ICD-10-CM

## 2019-09-06 DIAGNOSIS — D500 Iron deficiency anemia secondary to blood loss (chronic): Secondary | ICD-10-CM

## 2019-09-06 DIAGNOSIS — Z86711 Personal history of pulmonary embolism: Secondary | ICD-10-CM | POA: Diagnosis present

## 2019-09-06 DIAGNOSIS — D649 Anemia, unspecified: Secondary | ICD-10-CM | POA: Diagnosis not present

## 2019-09-06 DIAGNOSIS — R918 Other nonspecific abnormal finding of lung field: Secondary | ICD-10-CM | POA: Diagnosis not present

## 2019-09-06 DIAGNOSIS — I7300 Raynaud's syndrome without gangrene: Secondary | ICD-10-CM | POA: Diagnosis not present

## 2019-09-06 DIAGNOSIS — R519 Headache, unspecified: Secondary | ICD-10-CM | POA: Diagnosis not present

## 2019-09-06 DIAGNOSIS — K219 Gastro-esophageal reflux disease without esophagitis: Secondary | ICD-10-CM | POA: Diagnosis not present

## 2019-09-06 LAB — CBC WITH DIFFERENTIAL/PLATELET
Abs Immature Granulocytes: 0.04 10*3/uL (ref 0.00–0.07)
Basophils Absolute: 0.1 10*3/uL (ref 0.0–0.1)
Basophils Relative: 1 %
Eosinophils Absolute: 0.1 10*3/uL (ref 0.0–0.5)
Eosinophils Relative: 1 %
HCT: 38.2 % (ref 36.0–46.0)
Hemoglobin: 13 g/dL (ref 12.0–15.0)
Immature Granulocytes: 0 %
Lymphocytes Relative: 31 %
Lymphs Abs: 2.8 10*3/uL (ref 0.7–4.0)
MCH: 28.4 pg (ref 26.0–34.0)
MCHC: 34 g/dL (ref 30.0–36.0)
MCV: 83.6 fL (ref 80.0–100.0)
Monocytes Absolute: 0.5 10*3/uL (ref 0.1–1.0)
Monocytes Relative: 5 %
Neutro Abs: 5.6 10*3/uL (ref 1.7–7.7)
Neutrophils Relative %: 62 %
Platelets: 358 10*3/uL (ref 150–400)
RBC: 4.57 MIL/uL (ref 3.87–5.11)
RDW: 14 % (ref 11.5–15.5)
WBC: 9 10*3/uL (ref 4.0–10.5)
nRBC: 0 % (ref 0.0–0.2)

## 2019-09-06 LAB — IRON AND TIBC
Iron: 48 ug/dL (ref 28–170)
Saturation Ratios: 16 % (ref 10.4–31.8)
TIBC: 302 ug/dL (ref 250–450)
UIBC: 254 ug/dL

## 2019-09-06 LAB — COMPREHENSIVE METABOLIC PANEL
ALT: 14 U/L (ref 0–44)
AST: 14 U/L — ABNORMAL LOW (ref 15–41)
Albumin: 4 g/dL (ref 3.5–5.0)
Alkaline Phosphatase: 46 U/L (ref 38–126)
Anion gap: 11 (ref 5–15)
BUN: 14 mg/dL (ref 6–20)
CO2: 18 mmol/L — ABNORMAL LOW (ref 22–32)
Calcium: 8.5 mg/dL — ABNORMAL LOW (ref 8.9–10.3)
Chloride: 110 mmol/L (ref 98–111)
Creatinine, Ser: 0.86 mg/dL (ref 0.44–1.00)
GFR calc Af Amer: >60 mL/min (ref >60)
GFR calc non Af Amer: >60 mL/min (ref >60)
Glucose, Bld: 99 mg/dL (ref 70–99)
Potassium: 3.6 mmol/L (ref 3.5–5.1)
Sodium: 139 mmol/L (ref 135–145)
Total Bilirubin: 0.4 mg/dL (ref 0.3–1.2)
Total Protein: 7.4 g/dL (ref 6.5–8.1)

## 2019-09-06 LAB — PROTIME-INR
INR: 2 — ABNORMAL HIGH (ref 0.8–1.2)
Prothrombin Time: 22.4 seconds — ABNORMAL HIGH (ref 11.4–15.2)

## 2019-09-06 LAB — FERRITIN: Ferritin: 51 ng/mL (ref 11–307)

## 2019-09-06 NOTE — Telephone Encounter (Signed)
Contacted Dr. Cleon Dew office to inform them of patient's symptoms with taking Savella. Spoke with Aniya, CMA and informed her since patient increased dose to 25 MG BID she started experiencing palpitations, hot flashes, insomnia, and HTN. CMA to make PCP aware and office to contact patient.

## 2019-09-06 NOTE — Progress Notes (Signed)
[facility]  [address], [address] [place], [place] [address] Phone: [phone]  Fax: [phone]   Clinic Day:  09/06/2019  Referring physician: Sharyne Peach, MD  Chief Complaint: Angela [name] is a 44 y.o. female with recurrent pulmonary embolism who is seen for 2 month assessment.    HPI: The patient was last seen in the hematology clinic via telemedicine on 07/03/2019. At that time, she noted some bruising on her legs.  She was on Coumadin 10 mg 3x/week (Monday, Wednesday, Friday) and 7.5 mg4x/week (Sunday, Tuesday, Thursday, Saturday). Coumadin was being monitored by Dr. Iona Beard.  She notes plans for gastric sleeve surgery between 08/2019 - 10/2019.  We discussed the need for a Lovenox bridge.  INR has been followed: 1.4 on 07/05/2019, 4.0 on 07/17/2019, 3.7 on 08/02/2019, and 2.7 on 08/22/2019.  CBC on 07/19/2019 revealed a hematocrit 38.2, hemoglobin 12.7, MCV 86.0, platelets 249,000, WBC 10,400.   She was seen by Dr. Iona Beard on 08/22/2019.  She was taking Coumadin 7.5 mg on Sundays and Wednesdays and 10 mg on all of the other days.  During the interim, she has had issues with new medication milnacipran (Savella) for her fibromyalgia. She had taken it before; it was discontinued previously, but she was unsure why. She just restarted taking Savella. She noticed palpitations; she would sit with her baby and would have hot flashes.  She is taking 25 mg.   She remains on Coumadin.  She takes 7.5 mg for 3 days and 10 mg for 4 days a week. She has Coumadin 5 mg pills. She hasn't missed any doses. She no longer takes Boost and equate due to the high vitamin K .  She makes her own protein shakes and the ones she buys online; she makes sure that they have very little vitamin K.  Symptomatically, she has been up for 24 hours; she describes issues with insomnia.  She is to see Dr. Allen Norris in the next few weeks.   She had a bad fibromyalgia flare after  Christmas, but has been "ok" since.  She has been battling a headache for 2 days.  She took her injection last night and has taken Tylenol to help make headache tolerable.   She doesn't have a surgery date for gastric bypass. Dr. Darl Householder is her general surgeon.  She needs her Lovenox pre-approved through [employer].   Past Medical History:  Diagnosis Date  . Anemia   . Anxiety   . Asthma   . Depression   . Fibromyalgia   . GERD (gastroesophageal reflux disease)   . Hypertension   . Irritable bowel syndrome (IBS)   . Pseudotumor cerebri   . Pulmonary embolism ([place])   . Raynaud's disease   . Thyroid disease    hypothyroid  . Vision disturbance     Past Surgical History:  Procedure Laterality Date  . ABDOMINAL HYSTERECTOMY  02/06/13  . BREAST BIOPSY Right October, 2014   right breast core biopsy, fibroadenomatous changes  . BREAST BIOPSY Right December 2014   FNA retroareolar nodule consistent with fibroadenoma.  Marland Kitchen BREAST BIOPSY Right 12/16/2016   Fibroadenoma in the retroareolar area at 6:00.  Marland Kitchen [place]  . COLONOSCOPY  2015   Dr Allen Norris  . COLONOSCOPY WITH PROPOFOL N/A 05/27/2019   Procedure: COLONOSCOPY WITH PROPOFOL;  Surgeon: Virgel Manifold, MD;  Location: [facility] END[facility];  Service: Endoscopy;  Laterality: N/A;  . ESOPHAGOGASTRODUODENOSCOPY (EGD) WITH PROPOFOL N/A  05/27/2019   Procedure: ESOPHAGOGASTRODUODENOSCOPY (EGD) WITH PROPOFOL;  Surgeon: Virgel Manifold, MD;  Location: [facility] ENDOSCOPY;  Service: Endoscopy;  Laterality: N/A;  . IVC FILTER INSERTION N/A 05/29/2019   Procedure: IVC FILTER INSERTION;  Surgeon: Algernon Huxley, MD;  Location: [facility];  Service: Cardiovascular;  Laterality: N/A;  . RHINOPLASTY    . TONSILECTOMY, ADENOIDECTOMY, BILATERAL MYRINGOTOMY AND TUBES  2004  . UPPER GASTROINTESTINAL ENDOSCOPY  2015   Dr Allen Norris    Family History  Problem Relation Age of Onset  . Depression Mother   .  Migraines Mother   . Diverticulitis Mother   . Hypertension Mother   . Heart disease Father   . Diabetes Father   . Hypertension Father   . Stroke Sister   . Cancer Maternal Aunt   . Breast cancer Maternal Aunt   . Cancer Maternal Grandmother     Social History:  reports that she has never smoked. She has never used smokeless tobacco. She reports current alcohol use. She reports that she does not use drugs. She drinks alcohol occasionally, in social settings. She has 3 children and 3 grandchildren.She lives in [place]. The patient is alone today.  Allergies:  Allergies  Allergen Reactions  . Amoxicillin Hives    Did it involve swelling of the face/tongue/throat, SOB, or low BP? No Did it involve sudden or severe rash/hives, skin peeling, or any reaction on the inside of your mouth or nose? No Did you need to seek medical attention at a hospital or doctor's office? Unknown When did it last happen?5+ years If all above answers are "NO", may proceed with cephalosporin use.   Angela [name]     Break out in mouth  . Lisinopril Cough  . Nortriptyline     Unknown reaction   . Orange Fruit [Citrus]     Break out in mouth  . Other     Sulfa eye drops- Scratched corneas  . Penicillins Hives    Did it involve swelling of the face/tongue/throat, SOB, or low BP? No Did it involve sudden or severe rash/hives, skin peeling, or any reaction on the inside of your mouth or nose? No Did you need to seek medical attention at a hospital or doctor's office? Unknown When did it last happen?5+ years If all above answers are "NO", may proceed with cephalosporin use.     Current Medications: Current Outpatient Medications  Medication Sig Dispense Refill  . acetaminophen (TYLENOL) 500 MG tablet Take 1,000-1,500 mg by mouth every 6 (six) hours as needed for moderate pain or headache.    . albuterol (PROVENTIL HFA;VENTOLIN HFA) 108 (90 BASE) MCG/ACT inhaler Inhale 2 puffs into the lungs  every 6 (six) hours as needed for wheezing.    . Carboxymethylcellulose Sodium (REFRESH LIQUIGEL OP) Place 1 drop into both eyes daily as needed (dry eyes).    . clindamycin (CLEOCIN T) 1 % lotion Apply 1 application topically 2 (two) times daily as needed (acne).    . cyclobenzaprine (FLEXERIL) 10 MG tablet Take 1 tablet (10 mg total) by mouth as needed. (Patient taking differently: Take 10 mg by mouth 3 (three) times daily as needed for muscle spasms. ) 30 tablet 3  . Difluprednate (DUREZOL) 0.05 % EMUL Place 1 drop into both eyes every 6 (six) hours as needed (uveitis).     Marland Kitchen diphenhydrAMINE (BENADRYL) 25 mg capsule Take 25 mg by mouth 2 (two) times daily as needed for allergies or sleep.     Marland Kitchen  EPINEPHrine (EPIPEN 2-PAK) 0.3 mg/0.3 mL IJ SOAJ injection Inject 0.3 mg into the muscle as needed for anaphylaxis.     . fluticasone (FLONASE) 50 MCG/ACT nasal spray Place 1 spray into both nostrils daily as needed for rhinitis.    . Galcanezumab-gnlm 120 MG/ML SOAJ Inject 120 mg into the skin every 28 (twenty-eight) days.     Marland Kitchen levothyroxine (SYNTHROID) 150 MCG tablet Take 150 mcg by mouth daily before breakfast.     . linaclotide (LINZESS) 72 MCG capsule Take 72 mcg by mouth daily as needed (constipation).    Marland Kitchen loratadine (CLARITIN) 10 MG tablet Take 10 mg by mouth daily.    . Melatonin 3 MG CAPS Take 3 mg by mouth at bedtime as needed (sleep).    . Milnacipran HCl (SAVELLA TITRATION PACK) 12.5 & 25 & 50 MG MISC 12.5 mg once on day 1, then 12.5 mg twice daily on days 2 to 3, 25 mg twice daily on days 4 to 7, then 50 mg twice daily thereafter.    . Multiple Vitamin (MULTIVITAMIN WITH MINERALS) TABS tablet Take 1 tablet by mouth daily.    . ondansetron (ZOFRAN) 8 MG tablet Take 8 mg by mouth every 8 (eight) hours as needed for nausea or vomiting.    Marland Kitchen oxybutynin (DITROPAN) 5 MG tablet Take 5 mg by mouth at bedtime.    . pantoprazole (PROTONIX) 40 MG tablet Take 40 mg by mouth daily.     . phentermine  (ADIPEX-P) 37.5 MG tablet Take by mouth daily before breakfast. 0.5 tablet    . prednisoLONE acetate (PRED FORTE) 1 % ophthalmic suspension Place 1 drop into both eyes every 4 (four) hours as needed (uveitis).     . promethazine (PHENERGAN) 25 MG tablet Take 25 mg by mouth every 6 (six) hours as needed for nausea or vomiting.    . rosuvastatin (CRESTOR) 20 MG tablet Take 20 mg by mouth daily.    . sodium fluoride (DENTAGEL) 1.1 % GEL dental gel Place 1 application onto teeth at bedtime.     Marland Kitchen tiZANidine (ZANAFLEX) 4 MG tablet Take 4 mg by mouth every 12 (twelve) hours as needed for muscle spasms.    Marland Kitchen topiramate (TOPAMAX) 50 MG tablet Take 50 mg by mouth 2 (two) times daily.    . traMADol (ULTRAM) 50 MG tablet Take 1 tablet (50 mg total) by mouth every 6 (six) hours as needed for moderate pain. 12 tablet 0  . warfarin (COUMADIN) 7.5 MG tablet Take 1 tablet (7.5 mg total) by mouth daily at 6 PM for 2 days. (Patient taking differently: Take 7.5 mg by mouth daily at 6 PM. 3 days a week 10 mg , then rest of the week 7.5 mg) 2 tablet 0   No current facility-administered medications for this visit.    Review of Systems  Constitutional: Positive for diaphoresis (due to [facility]), malaise/fatigue (from insomnia) and weight loss (intentional). Negative for chills and fever.       Feels "ok".  HENT: Negative.  Negative for congestion, ear discharge, ear pain, hearing loss, nosebleeds, sinus pain and sore throat.   Eyes: Negative.  Negative for blurred vision and double vision.  Respiratory: Positive for shortness of breath (chronic). Negative for cough, hemoptysis and sputum production.   Cardiovascular: Positive for palpitations (due to medication). Negative for chest pain, leg swelling and PND.  Gastrointestinal: Positive for abdominal pain. Negative for blood in stool, constipation, diarrhea, melena and vomiting.  Irritable bowel syndrome (IBS). Eating 3 meals daily.  Genitourinary: Negative.   Negative for dysuria, frequency, hematuria and urgency.  Musculoskeletal: Positive for back pain. Negative for joint pain and myalgias.       Fibromylagia.  Skin: Negative.  Negative for rash.  Neurological: Positive for headaches. Negative for dizziness, tingling, sensory change, speech change, focal weakness, seizures and weakness.  Endo/Heme/Allergies: Bruises/bleeds easily (bruising on legs).  Psychiatric/Behavioral: Negative for memory loss. The patient has insomnia. The patient is not nervous/anxious.   All other systems reviewed and are negative.  Performance status (ECOG): 1  Vitals Blood pressure (!) 153/99, pulse 82, temperature 98 F (36.7 C), temperature source Tympanic, resp. rate 20, weight 297 lb 13.5 oz (135.1 kg), SpO2 100 %.   Physical Exam  Constitutional: She is oriented to person, place, and time. She appears well-developed and well-nourished. No distress.  HENT:  Head: Normocephalic and atraumatic.  Mouth/Throat: Oropharynx is clear and moist. No oropharyngeal exudate.  Curly long hair.  Mask.  Eyes: Pupils are equal, round, and reactive to light. Conjunctivae and EOM are normal. No scleral icterus.  Glasses.  Neck: No JVD present.  Cardiovascular: Normal rate, regular rhythm and normal heart sounds.  No murmur heard. Pulmonary/Chest: Effort normal and breath sounds normal. No respiratory distress. She has no wheezes. She has no rales.  Abdominal: Soft. Bowel sounds are normal. She exhibits no distension and no mass. There is no abdominal tenderness. There is no rebound and no guarding.  Musculoskeletal:        General: Normal range of motion.     Cervical back: Normal range of motion and neck supple.  Lymphadenopathy:       Head (right side): No preauricular, no posterior auricular and no occipital adenopathy present.       Head (left side): No preauricular, no posterior auricular and no occipital adenopathy present.    She has no cervical adenopathy.    She  has no axillary adenopathy.       Right: No inguinal and no supraclavicular adenopathy present.       Left: No inguinal and no supraclavicular adenopathy present.  Neurological: She is alert and oriented to person, place, and time.  Skin: Skin is warm and dry. No rash noted. She is not diaphoretic. No erythema. No pallor.  Psychiatric: She has a normal mood and affect. Her behavior is normal. Judgment and thought content normal.  Nursing note and vitals reviewed.   Appointment on 09/06/2019  Component Date Value Ref Range Status  . WBC 09/06/2019 9.0  4.0 - 10.5 K/uL Final  . RBC 09/06/2019 4.57  3.87 - 5.11 MIL/uL Final  . Hemoglobin 09/06/2019 13.0  12.0 - 15.0 g/dL Final  . HCT 09/06/2019 38.2  36.0 - 46.0 % Final  . MCV 09/06/2019 83.6  80.0 - 100.0 fL Final  . MCH 09/06/2019 28.4  26.0 - 34.0 pg Final  . MCHC 09/06/2019 34.0  30.0 - 36.0 g/dL Final  . RDW 09/06/2019 14.0  11.5 - 15.5 % Final  . Platelets 09/06/2019 358  150 - 400 K/uL Final  . nRBC 09/06/2019 0.0  0.0 - 0.2 % Final  . Neutrophils Relative % 09/06/2019 62  % Final  . Neutro Abs 09/06/2019 5.6  1.7 - 7.7 K/uL Final  . Lymphocytes Relative 09/06/2019 31  % Final  . Lymphs Abs 09/06/2019 2.8  0.7 - 4.0 K/uL Final  . Monocytes Relative 09/06/2019 5  % Final  . Monocytes Absolute  09/06/2019 0.5  0.1 - 1.0 K/uL Final  . Eosinophils Relative 09/06/2019 1  % Final  . Eosinophils Absolute 09/06/2019 0.1  0.0 - 0.5 K/uL Final  . Basophils Relative 09/06/2019 1  % Final  . Basophils Absolute 09/06/2019 0.1  0.0 - 0.1 K/uL Final  . Immature Granulocytes 09/06/2019 0  % Final  . Abs Immature Granulocytes 09/06/2019 0.04  0.00 - 0.07 K/uL Final   Performed at [facility], [address], [place], [place] [address]  . Sodium 09/06/2019 139  135 - 145 mmol/L Final  . Potassium 09/06/2019 3.6  3.5 - 5.1 mmol/L Final  . Chloride 09/06/2019 110  98 - 111 mmol/L Final  . CO2 09/06/2019 18* 22 - 32 mmol/L Final  .  Glucose, Bld 09/06/2019 99  70 - 99 mg/dL Final  . BUN 09/06/2019 14  6 - 20 mg/dL Final  . Creatinine, Ser 09/06/2019 0.86  0.44 - 1.00 mg/dL Final  . Calcium 09/06/2019 8.5* 8.9 - 10.3 mg/dL Final  . Total Protein 09/06/2019 7.4  6.5 - 8.1 g/dL Final  . Albumin 09/06/2019 4.0  3.5 - 5.0 g/dL Final  . AST 09/06/2019 14* 15 - 41 U/L Final  . ALT 09/06/2019 14  0 - 44 U/L Final  . Alkaline Phosphatase 09/06/2019 46  38 - 126 U/L Final  . Total Bilirubin 09/06/2019 0.4  0.3 - 1.2 mg/dL Final  . GFR calc non Af Amer 09/06/2019 >60  >60 mL/min Final  . GFR calc Af Amer 09/06/2019 >60  >60 mL/min Final  . Anion gap 09/06/2019 11  5 - 15 Final   Performed at [facility], [address], [place], [place] [address]    Assessment:  Angela [name] is a 44 y.o. 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 She is on Coumadin.  Work-up on 12/22/2018 included the following normal/negative labs: CBC with diff, lupus anticoagulant, protein C activity, and protein S activity/antigen.Normal labson 01/06/2019 included: Factor V Leiden, prothrombin gene mutation, anticardiolipin antibodies, and beta2-glycoprotein antibodies.  Chest CT angiogramon 12/20/2018 revealed a wedge-shaped opacity in the right lower lobelateral basal segment consistent with a pulmonary infarct. There was relative hypoenhancement within the associated segmental artery probablysecondary toa small pulmonary embolus, though it wasdifficult to assessgiven limitationsof beam attenuation caused by the patient's body habitus. There was a small right pleural effusion with associated atelectasis.  Bilateral lower extremity duplexon 12/21/2018 revealed no femoropopliteal and no calf DVT in the visualized calf veins.  AbdomenandpelvisCTon 01/16/2019 revealed no acute findings. There was a new 2 cm pleural-based nodule in posterior right lower lobe.  PET scanon 01/26/2019 revealed no  hypermetabolic findings highly suspicious for a malignant process. 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  [facility] for [facility] and a lower GI bleed. CTA abdomen and pelvison 05/26/2019 showed scattered diverticulosis, large stool burden. Tagged RBCscanon 05/26/2019 showed no evidence of GI bleed. IVC filterwas placedon 05/29/2019. EGDand colonoscopyon 05/27/2019 showed no evidence of active or recent bleeding. The source of patient's bleeding was likely internal hemorrhoids.   Bilateral mammogram and left breast ultrasound on 06/26/2019 revealed a probably benign, 12 mm fibrocystic lesion at the 9:30 o'clock position, 7 cm the nipple.  Ultrasound revealed a cystic and solid lesion with echogenic foci consistent with calcifications.  The appearance was consistent with a fibrocystic lesion. Short-term follow-up (diagnostic left mammogram and ultrasound) was recommended in 6 months.    Symptomatically, She notes issues with her new medication (Savella).  She denies any bruising or bleeding on Coumadin.  She notes plans for gastric sleeve surgery between 08/2019 - 10/2019.   Plan: 1.   CBC with diff, PT/INR. 2.   Recurrent pulmonary embolism Patientwithpulmonary embolism in 2006and2020. Work-upwasnegative. PET scan revealed no malignancy. She has an IVC filter in place. She is currently taking Coumadin 10 mg 4x/week and 7.5 mg3x/week.                         Coumadin is being monitored by Dr. Iona Beard. INR was 2.0 today.  GoalINR is 2-3. 3.  Planned gastric sleeve surgery  Discuss bridging of Lovenox and Coumadin   Discontinue  Coumadin 5 days prior to surgery.   Check INR on the day before surgery to ensure INR is normal.   Begin Lovenox 3 days prior to surgery and discontinue the evening prior to surgery (last preoperative dose 24 hours prior to surgery).   Restart Coumadin (prior dosing schedule) 12-24 hours after surgery (evening of surgery or the evening after surgery per surgeon).    Ensure adequate hemostasis as determined by surgery.    Restart Lovenox when cleared by surgery post operatively.    Typically 24-72 hours after hemostasis (determined by surgeon).   Anticipate 5-10 days of overlap between Coumadin and Lovenox to achieve a therapeutic INR.    Plan for 2 days of overlap with a therapeutic INR.    Check INR frequently (every 1-2 days).   Discontinue Lovenox when INR therapeutic (with at least 5 days of overlap).  Discuss need for follow-up iron and B12 levels after surgery.   Anticipate need for iron and B12 replacement. 4.Mammogram changes             Bilateral mammogram and left breast ultrasound on 06/26/2019 revealed likely cystic lesion (benign).             Follow-up left diagnostic mammogram and ultrasound on 12/26/2019. 5.Please approve Lovenox [employer] insurance). 6.   Patient to follow-up with Dr Iona Beard prior to gastric bypass. 7.   RTC after gastric bypass surgery.  I discussed the assessment and treatment plan with the patient.  The patient was provided an opportunity to ask questions and all were answered.  The patient agreed with the plan and demonstrated an understanding of the instructions.  The patient was advised to call back if the symptoms worsen or if the condition fails to improve as anticipated.  I provided 21 minutes (2:20 PM - 2:41 PM) of face-to-face time during this this encounter and > 50% was spent counseling as documented under my assessment and plan.    Lequita Asal, MD, PhD    09/06/2019, 2:41 PM  I, Samul Dada, am acting as a scribe for Lequita Asal, MD.  I, Lowry Mike Gip, MD, have reviewed the above documentation for accuracy and completeness, and I agree with the above.

## 2019-09-06 NOTE — Telephone Encounter (Signed)
Labs routed to the patient PCP office today.

## 2019-09-06 NOTE — Progress Notes (Signed)
Patient here for follow up. Requesting clearance for surgery. Denies any other concerns.

## 2019-09-07 ENCOUNTER — Telehealth: Payer: Self-pay

## 2019-09-07 NOTE — Telephone Encounter (Signed)
Spoke with the patient to inform her that her PT & INR was 22.4 and 2.0. The patient was understanding. I have also informed the patient that her labs has been routed to her PCP office.

## 2019-09-07 NOTE — Telephone Encounter (Signed)
-----   Message from Lequita Asal, MD sent at 09/07/2019  6:26 AM EST ----- Regarding: Please call patient with PT/INR and send copy to her PCP  ----- Message ----- From: Interface, Lab In [place] Sent: 09/06/2019   1:57 PM EST To: Lequita Asal, MD

## 2019-09-11 ENCOUNTER — Ambulatory Visit: Payer: Medicare Other | Admitting: Gastroenterology

## 2019-09-11 NOTE — Progress Notes (Deleted)
Primary Care Physician: Sharyne Peach, MD  Primary Gastroenterologist:  Dr. Lucilla Lame  No chief complaint on file.   HPI: Angela [name] is a 44 y.o. female here with a history of abdominal pain.  The patient had seen me for this abdominal in the past and it was determined that it was musculoskeletal due to the patient having reproducible pain with flexion of the abdominal wall muscles while palpating the abdominal wall with 1 finger.  The patient is on lifelong anticoagulation for recurrent pulmonary emboli and could not be given any anti-inflammatory medication.  The patient was started on Flexeril to help her symptoms of muscle pain at that time.  The patient also suffers from chronic back pain in addition to her abdominal wall pain.  Current Outpatient Medications  Medication Sig Dispense Refill  . acetaminophen (TYLENOL) 500 MG tablet Take 1,000-1,500 mg by mouth every 6 (six) hours as needed for moderate pain or headache.    . albuterol (PROVENTIL HFA;VENTOLIN HFA) 108 (90 BASE) MCG/ACT inhaler Inhale 2 puffs into the lungs every 6 (six) hours as needed for wheezing.    . Carboxymethylcellulose Sodium (REFRESH LIQUIGEL OP) Place 1 drop into both eyes daily as needed (dry eyes).    . clindamycin (CLEOCIN T) 1 % lotion Apply 1 application topically 2 (two) times daily as needed (acne).    . cyclobenzaprine (FLEXERIL) 10 MG tablet Take 1 tablet (10 mg total) by mouth as needed. (Patient taking differently: Take 10 mg by mouth 3 (three) times daily as needed for muscle spasms. ) 30 tablet 3  . Difluprednate (DUREZOL) 0.05 % EMUL Place 1 drop into both eyes every 6 (six) hours as needed (uveitis).     Marland Kitchen diphenhydrAMINE (BENADRYL) 25 mg capsule Take 25 mg by mouth 2 (two) times daily as needed for allergies or sleep.     Marland Kitchen EPINEPHrine (EPIPEN 2-PAK) 0.3 mg/0.3 mL IJ SOAJ injection Inject 0.3 mg into the muscle as needed for anaphylaxis.     . fluticasone (FLONASE) 50 MCG/ACT nasal  spray Place 1 spray into both nostrils daily as needed for rhinitis.    . Galcanezumab-gnlm 120 MG/ML SOAJ Inject 120 mg into the skin every 28 (twenty-eight) days.     Marland Kitchen levothyroxine (SYNTHROID) 150 MCG tablet Take 150 mcg by mouth daily before breakfast.     . linaclotide (LINZESS) 72 MCG capsule Take 72 mcg by mouth daily as needed (constipation).    Marland Kitchen loratadine (CLARITIN) 10 MG tablet Take 10 mg by mouth daily.    . Melatonin 3 MG CAPS Take 3 mg by mouth at bedtime as needed (sleep).    . Milnacipran HCl (SAVELLA TITRATION PACK) 12.5 & 25 & 50 MG MISC 12.5 mg once on day 1, then 12.5 mg twice daily on days 2 to 3, 25 mg twice daily on days 4 to 7, then 50 mg twice daily thereafter.    . Multiple Vitamin (MULTIVITAMIN WITH MINERALS) TABS tablet Take 1 tablet by mouth daily.    . ondansetron (ZOFRAN) 8 MG tablet Take 8 mg by mouth every 8 (eight) hours as needed for nausea or vomiting.    Marland Kitchen oxybutynin (DITROPAN) 5 MG tablet Take 5 mg by mouth at bedtime.    . pantoprazole (PROTONIX) 40 MG tablet Take 40 mg by mouth daily.     . phentermine (ADIPEX-P) 37.5 MG tablet Take by mouth daily before breakfast. 0.5 tablet    . prednisoLONE acetate (PRED FORTE) 1 %  ophthalmic suspension Place 1 drop into both eyes every 4 (four) hours as needed (uveitis).     . promethazine (PHENERGAN) 25 MG tablet Take 25 mg by mouth every 6 (six) hours as needed for nausea or vomiting.    . rosuvastatin (CRESTOR) 20 MG tablet Take 20 mg by mouth daily.    . sodium fluoride (DENTAGEL) 1.1 % GEL dental gel Place 1 application onto teeth at bedtime.     Marland Kitchen tiZANidine (ZANAFLEX) 4 MG tablet Take 4 mg by mouth every 12 (twelve) hours as needed for muscle spasms.    Marland Kitchen topiramate (TOPAMAX) 50 MG tablet Take 50 mg by mouth 2 (two) times daily.    . traMADol (ULTRAM) 50 MG tablet Take 1 tablet (50 mg total) by mouth every 6 (six) hours as needed for moderate pain. 12 tablet 0  . warfarin (COUMADIN) 7.5 MG tablet Take 1 tablet  (7.5 mg total) by mouth daily at 6 PM for 2 days. (Patient taking differently: Take 7.5 mg by mouth daily at 6 PM. 3 days a week 10 mg , then rest of the week 7.5 mg) 2 tablet 0   No current facility-administered medications for this visit.    Allergies as of 09/11/2019 - Review Complete 09/06/2019  Allergen Reaction Noted  . Amoxicillin Hives 05/29/2013  . Callender  05/25/2019  . Lisinopril Cough 11/26/2016  . Nortriptyline  12/20/2018  . Orange fruit [citrus]  05/25/2019  . Other  05/29/2013  . Penicillins Hives 05/29/2013    ROS:  General: Negative for anorexia, weight loss, fever, chills, fatigue, weakness. ENT: Negative for hoarseness, difficulty swallowing , nasal congestion. CV: Negative for chest pain, angina, palpitations, dyspnea on exertion, peripheral edema.  Respiratory: Negative for dyspnea at rest, dyspnea on exertion, cough, sputum, wheezing.  GI: See history of present illness. GU:  Negative for dysuria, hematuria, urinary incontinence, urinary frequency, nocturnal urination.  Endo: Negative for unusual weight change.    Physical Examination:   There were no vitals taken for this visit.  General: Well-nourished, well-developed in no acute distress.  Eyes: No icterus. Conjunctivae pink. Lungs: Clear to auscultation bilaterally. Non-labored. Heart: Regular rate and rhythm, no murmurs rubs or gallops.  Abdomen: Bowel sounds are normal, nontender, nondistended, no hepatosplenomegaly or masses, no abdominal bruits or hernia , no rebound or guarding.   Extremities: No lower extremity edema. No clubbing or deformities. Neuro: Alert and oriented x 3.  Grossly intact. Skin: Warm and dry, no jaundice.   Psych: Alert and cooperative, normal mood and affect.  Labs:    Imaging Studies: No results found.  Assessment and Plan:   Angela [name] is a 44 y.o. y/o female ***     Lucilla Lame, MD. Marval Regal    Note: This dictation was prepared with Dragon dictation  along with smaller phrase technology. Any transcriptional errors that result from this process are unintentional.

## 2019-09-11 NOTE — Progress Notes (Signed)
This encounter was created in error - please disregard.

## 2019-09-12 ENCOUNTER — Encounter: Payer: Self-pay | Admitting: Hematology and Oncology

## 2019-09-22 ENCOUNTER — Encounter: Payer: Self-pay | Admitting: Pain Medicine

## 2019-09-25 ENCOUNTER — Ambulatory Visit: Payer: Medicare Other | Admitting: Gastroenterology

## 2019-10-12 ENCOUNTER — Encounter: Payer: Self-pay | Admitting: Hematology and Oncology

## 2019-10-15 ENCOUNTER — Encounter: Payer: Self-pay | Admitting: Hematology and Oncology

## 2019-10-16 ENCOUNTER — Telehealth: Payer: Self-pay

## 2019-10-16 NOTE — Telephone Encounter (Signed)
Spoke with Dr Iona Beard office to see if Dr Iona Beard can give Dr Mike Gip a call about this patient she is not in the office today he nures say she will give the PCP the message a to call Dr Mike Gip, but she is not in the office today. So she will reach out and give the PCP the message.

## 2019-10-17 ENCOUNTER — Other Ambulatory Visit: Payer: Self-pay | Admitting: Oncology

## 2019-10-17 ENCOUNTER — Encounter: Payer: Self-pay | Admitting: Hematology and Oncology

## 2019-10-17 MED ORDER — ENOXAPARIN SODIUM 30 MG/0.3ML ~~LOC~~ SOLN: 140.0000 mg | Freq: Two times a day (BID) | SUBCUTANEOUS | 0 refills | Status: DC

## 2019-10-17 NOTE — Progress Notes (Signed)
Re: Lovenox  Rx Lovenox sent to pharmacy. 140 mg (1.4 ml) of lovenox every 12 hours sq  Discuss bridging of Lovenox and Coumadin Discontinue Coumadin 5 days prior to surgery. Check INR on the day before surgery to ensure INR is normal. Begin Lovenox 3 days prior to surgery and discontinue the evening prior to surgery (last preoperative dose 24 hours prior to surgery). Restart Coumadin (prior dosing schedule) 12-24 hours after surgery (evening of surgery or the evening after surgery per surgeon). Ensure adequate hemostasis as determined by surgery.       Restart Lovenox when cleared by surgery post operatively. Typically 24-72 hours after hemostasis (determined by surgeon). Anticipate 5-10 days of overlap between Coumadin and Lovenox to achieve a therapeutic INR. Plan for 2 days of overlap with a therapeutic INR. Check INR frequently (every 1-2 days). Discontinue Lovenox when INR therapeutic (with at least 5 days of overlap).  Faythe Casa, NP 10/17/2019 2:36 PM

## 2019-10-18 ENCOUNTER — Other Ambulatory Visit: Payer: Self-pay | Admitting: Oncology

## 2019-10-18 ENCOUNTER — Telehealth: Payer: Self-pay

## 2019-10-18 ENCOUNTER — Other Ambulatory Visit: Payer: Self-pay

## 2019-10-18 DIAGNOSIS — Z7901 Long term (current) use of anticoagulants: Secondary | ICD-10-CM

## 2019-10-18 DIAGNOSIS — I2699 Other pulmonary embolism without acute cor pulmonale: Secondary | ICD-10-CM

## 2019-10-18 MED ORDER — ENOXAPARIN SODIUM 150 MG/ML ~~LOC~~ SOLN: 1.0000 mg/kg | Freq: Two times a day (BID) | SUBCUTANEOUS | 0 refills | Status: DC

## 2019-10-18 NOTE — Telephone Encounter (Signed)
spoke with Deneise Lever from [facility] surgery to go over treatment plan / post - opt  plan for Lovenox and brindging. Ms Deneise Lever was agreeable with treatment plan.   Rx Lovenox sent to pharmacy. 140 mg (1.4 ml) of lovenox every 12 hours sq  Discuss bridging of Lovenox and Coumadin Discontinue Coumadin 5 days prior to surgery. Check INR on theday before surgery to ensure INR is normal. Begin Lovenox 3 days prior to surgery and discontinue the evening prior to surgery (last preoperative dose 24 hours prior to surgery). Restart Coumadin (prior dosing schedule) 12-24 hours after surgery (evening of surgery or the evening after surgery per surgeon). Ensure adequate hemostasis as determined by surgery. Restart Lovenox when cleared by surgery post operatively. Typically 24-72 hours after hemostasis (determined by surgeon). Anticipate 5-10 days of overlap between Coumadin and Lovenox to achieve a therapeutic INR. Plan for 2 days of overlap with a therapeutic INR. Check INR frequently (every 1-2 days). Discontinue Lovenox when INR therapeutic (with at least 5 days of overlap).

## 2019-10-18 NOTE — Telephone Encounter (Signed)
This has been sent to the patient through mychart. The patient has confirmed she has received the instruction and was able to go over with me today. Lovenox has been called into the pharmacy ( Lovenox 140 mg BID inject into the skin. I have also informed the patient that she can go to the ED to get her INR checked on Sunday. I have informed the patient when she have the Lovenox in hand to give the office a call.  The patient was understanding and agreeable   Planned gastric sleeve surgery Discuss bridging of Lovenox and Coumadin Discontinue Coumadin 5 days prior to surgery. Check INR on theday before surgery to ensure INR is normal. Begin Lovenox 3 days prior to surgery and discontinue the evening prior to surgery (last preoperative dose 24 hours prior to surgery). Restart Coumadin (prior dosing schedule) 12-24 hours after surgery (evening of surgery or the evening after surgery per surgeon). Ensure adequate hemostasis as determined by surgery. Restart Lovenox when cleared by surgery post operatively. Typically 24-72 hours after hemostasis (determined by surgeon). Anticipate 5-10 days of overlap between Coumadin and Lovenox to achieve a therapeutic INR. Plan for 2 days of overlap with a therapeutic INR. Check INR frequently (every 1-2 days). Discontinue Lovenox when INR therapeutic (with at least 5 days of overlap). Lovenox 140 mg / 1.4 ml Every 12 hours.

## 2019-10-19 ENCOUNTER — Telehealth: Payer: Self-pay

## 2019-10-19 NOTE — Telephone Encounter (Signed)
Spoke with the patient to see if she was able to pick up the lovenox. The patient inform me the pharmacy has the script ready and she will pick it up today. The patient patient expressed understanding.

## 2019-10-19 NOTE — Telephone Encounter (Signed)
Spoke with the patient this morning to see if she went to pick up the lovenox form [facility] in [place]. The patient states no she did not. I have informed the patient that she need to go pick up the Lovenox now and give me a call back because I need to know that she has the Lovenox in a timely manner. The patient states she will call back to inform me she has received the Lovenox injection. I am going to reach back out to the patient by noon today to see if she has the medication on hand. The patient was understanding and agreeable.

## 2019-10-19 NOTE — Telephone Encounter (Signed)
spoke with the patient to see if she was able to pick uo the lovenox and she states the pharmacy asked if she can call back after lunch, because they are waiting on it to come in. The patient wanted the script sent to [facility] but with me speaking with the staff they dont have it in stock they will have to order it also and it want be in until tomorrow. I will speak with the patient after lunch.

## 2019-10-20 ENCOUNTER — Telehealth: Payer: Self-pay

## 2019-10-20 NOTE — Telephone Encounter (Signed)
Spoke with the patient and she was able to get the lovenox last night and she was able to take the first dose today 140 mg and she will adminster 140 mg Q 12 hrs from first dose. She has been informed that after surgery they will give her post opt instruction. I was able to review all instruction with the patient today. I have also informed the patient if she have any concerns or changes please feel free to contact the office. The patient was understanding and agreeable.

## 2019-10-22 ENCOUNTER — Other Ambulatory Visit
Admission: RE | Admit: 2019-10-22 | Discharge: 2019-10-22 | Disposition: A | Discharge status: Home / Self Care | Payer: Medicare Other | Attending: Hematology and Oncology | Admitting: Hematology and Oncology

## 2019-10-22 DIAGNOSIS — I2699 Other pulmonary embolism without acute cor pulmonale: Secondary | ICD-10-CM | POA: Diagnosis present

## 2019-10-22 DIAGNOSIS — Z7901 Long term (current) use of anticoagulants: Secondary | ICD-10-CM

## 2019-10-22 LAB — PROTIME-INR
INR: 1.1 (ref 0.8–1.2)
Prothrombin Time: 14.5 seconds (ref 11.4–15.2)

## 2019-10-25 ENCOUNTER — Telehealth: Payer: Self-pay | Admitting: Hematology and Oncology

## 2019-10-25 NOTE — Telephone Encounter (Signed)
Re:  Follow-up s/p gastric bypass surgery  Today I spoke with Hansel Starling, NP for Dr Beather Arbour, gastric bypass surgeon.  The patient is doing well.  She is to be discharged today.  She began Lovenox on POD #1.  She is on Coumadin 5 mg a day.  INR is 1.2 today.  She has a follow-up with Dr Iona Beard on Friday, 10/27/2019, for anticoagulation monitoring.   Lequita Asal, MD

## 2019-10-31 ENCOUNTER — Other Ambulatory Visit: Payer: Self-pay

## 2019-10-31 ENCOUNTER — Encounter: Payer: Self-pay | Admitting: Anesthesiology

## 2019-10-31 ENCOUNTER — Encounter
Admission: EM | Disposition: A | Discharge status: Other Acute Inpatient Hospital | Payer: Self-pay | Source: Home / Self Care | Attending: Surgery

## 2019-10-31 ENCOUNTER — Emergency Department: Payer: Medicare Other

## 2019-10-31 ENCOUNTER — Encounter: Payer: Self-pay | Admitting: Emergency Medicine

## 2019-10-31 ENCOUNTER — Inpatient Hospital Stay
Admission: EM | Admit: 2019-10-31 | Discharge: 2019-11-01 | DRG: 907 | Disposition: A | Discharge status: Other Acute Inpatient Hospital | Payer: Medicare Other | Attending: Surgery | Admitting: Surgery

## 2019-10-31 DIAGNOSIS — I9762 Postprocedural hemorrhage of a circulatory system organ or structure following other procedure: Secondary | ICD-10-CM

## 2019-10-31 DIAGNOSIS — Z6841 Body Mass Index (BMI) 40.0 and over, adult: Secondary | ICD-10-CM | POA: Diagnosis not present

## 2019-10-31 DIAGNOSIS — Z86718 Personal history of other venous thrombosis and embolism: Secondary | ICD-10-CM

## 2019-10-31 DIAGNOSIS — Z880 Allergy status to penicillin: Secondary | ICD-10-CM | POA: Diagnosis not present

## 2019-10-31 DIAGNOSIS — Z823 Family history of stroke: Secondary | ICD-10-CM | POA: Diagnosis not present

## 2019-10-31 DIAGNOSIS — Z79899 Other long term (current) drug therapy: Secondary | ICD-10-CM | POA: Diagnosis not present

## 2019-10-31 DIAGNOSIS — E039 Hypothyroidism, unspecified: Secondary | ICD-10-CM | POA: Diagnosis present

## 2019-10-31 DIAGNOSIS — K661 Hemoperitoneum: Secondary | ICD-10-CM | POA: Diagnosis present

## 2019-10-31 DIAGNOSIS — N179 Acute kidney failure, unspecified: Secondary | ICD-10-CM

## 2019-10-31 DIAGNOSIS — Z9884 Bariatric surgery status: Secondary | ICD-10-CM | POA: Diagnosis not present

## 2019-10-31 DIAGNOSIS — Z7901 Long term (current) use of anticoagulants: Secondary | ICD-10-CM | POA: Diagnosis not present

## 2019-10-31 DIAGNOSIS — Y832 Surgical operation with anastomosis, bypass or graft as the cause of abnormal reaction of the patient, or of later complication, without mention of misadventure at the time of the procedure: Secondary | ICD-10-CM | POA: Diagnosis present

## 2019-10-31 DIAGNOSIS — Z86711 Personal history of pulmonary embolism: Secondary | ICD-10-CM | POA: Diagnosis not present

## 2019-10-31 DIAGNOSIS — Z803 Family history of malignant neoplasm of breast: Secondary | ICD-10-CM | POA: Diagnosis not present

## 2019-10-31 DIAGNOSIS — Z833 Family history of diabetes mellitus: Secondary | ICD-10-CM | POA: Diagnosis not present

## 2019-10-31 DIAGNOSIS — Z95828 Presence of other vascular implants and grafts: Secondary | ICD-10-CM

## 2019-10-31 DIAGNOSIS — D72829 Elevated white blood cell count, unspecified: Secondary | ICD-10-CM | POA: Diagnosis present

## 2019-10-31 DIAGNOSIS — R578 Other shock: Secondary | ICD-10-CM | POA: Diagnosis present

## 2019-10-31 DIAGNOSIS — Z8249 Family history of ischemic heart disease and other diseases of the circulatory system: Secondary | ICD-10-CM

## 2019-10-31 DIAGNOSIS — R7989 Other specified abnormal findings of blood chemistry: Secondary | ICD-10-CM | POA: Diagnosis not present

## 2019-10-31 DIAGNOSIS — Z20822 Contact with and (suspected) exposure to covid-19: Secondary | ICD-10-CM | POA: Diagnosis present

## 2019-10-31 DIAGNOSIS — Z79890 Hormone replacement therapy (postmenopausal): Secondary | ICD-10-CM

## 2019-10-31 HISTORY — PX: EMBOLIZATION: CATH118239

## 2019-10-31 LAB — COMPREHENSIVE METABOLIC PANEL
ALT: 15 U/L (ref 0–44)
AST: 17 U/L (ref 15–41)
Albumin: 3.3 g/dL — ABNORMAL LOW (ref 3.5–5.0)
Alkaline Phosphatase: 35 U/L — ABNORMAL LOW (ref 38–126)
Anion gap: 16 — ABNORMAL HIGH (ref 5–15)
BUN: 20 mg/dL (ref 6–20)
CO2: 14 mmol/L — ABNORMAL LOW (ref 22–32)
Calcium: 8.4 mg/dL — ABNORMAL LOW (ref 8.9–10.3)
Chloride: 108 mmol/L (ref 98–111)
Creatinine, Ser: 1.84 mg/dL — ABNORMAL HIGH (ref 0.44–1.00)
GFR calc Af Amer: 38 mL/min — ABNORMAL LOW (ref >60)
GFR calc non Af Amer: 33 mL/min — ABNORMAL LOW (ref >60)
Glucose, Bld: 308 mg/dL — ABNORMAL HIGH (ref 70–99)
Potassium: 4.3 mmol/L (ref 3.5–5.1)
Sodium: 138 mmol/L (ref 135–145)
Total Bilirubin: 0.7 mg/dL (ref 0.3–1.2)
Total Protein: 6.3 g/dL — ABNORMAL LOW (ref 6.5–8.1)

## 2019-10-31 LAB — CBC WITH DIFFERENTIAL/PLATELET
Abs Immature Granulocytes: 0.13 10*3/uL — ABNORMAL HIGH (ref 0.00–0.07)
Basophils Absolute: 0.1 10*3/uL (ref 0.0–0.1)
Basophils Relative: 0 %
Eosinophils Absolute: 0 10*3/uL (ref 0.0–0.5)
Eosinophils Relative: 0 %
HCT: 29.3 % — ABNORMAL LOW (ref 36.0–46.0)
Hemoglobin: 9.5 g/dL — ABNORMAL LOW (ref 12.0–15.0)
Immature Granulocytes: 1 %
Lymphocytes Relative: 19 %
Lymphs Abs: 3.9 10*3/uL (ref 0.7–4.0)
MCH: 28.4 pg (ref 26.0–34.0)
MCHC: 32.4 g/dL (ref 30.0–36.0)
MCV: 87.7 fL (ref 80.0–100.0)
Monocytes Absolute: 1.1 10*3/uL — ABNORMAL HIGH (ref 0.1–1.0)
Monocytes Relative: 6 %
Neutro Abs: 15 10*3/uL — ABNORMAL HIGH (ref 1.7–7.7)
Neutrophils Relative %: 74 %
Platelets: 323 10*3/uL (ref 150–400)
RBC: 3.34 MIL/uL — ABNORMAL LOW (ref 3.87–5.11)
RDW: 14 % (ref 11.5–15.5)
WBC: 20.2 10*3/uL — ABNORMAL HIGH (ref 4.0–10.5)
nRBC: 0 % (ref 0.0–0.2)

## 2019-10-31 LAB — CBC
HCT: 25.8 % — ABNORMAL LOW (ref 36.0–46.0)
HCT: 29.5 % — ABNORMAL LOW (ref 36.0–46.0)
Hemoglobin: 8.6 g/dL — ABNORMAL LOW (ref 12.0–15.0)
Hemoglobin: 9.9 g/dL — ABNORMAL LOW (ref 12.0–15.0)
MCH: 29.2 pg (ref 26.0–34.0)
MCH: 29.6 pg (ref 26.0–34.0)
MCHC: 33.3 g/dL (ref 30.0–36.0)
MCHC: 33.6 g/dL (ref 30.0–36.0)
MCV: 87.5 fL (ref 80.0–100.0)
MCV: 88.1 fL (ref 80.0–100.0)
Platelets: 257 10*3/uL (ref 150–400)
Platelets: 237 10*3/uL (ref 150–400)
RBC: 2.95 MIL/uL — ABNORMAL LOW (ref 3.87–5.11)
RBC: 3.35 MIL/uL — ABNORMAL LOW (ref 3.87–5.11)
RDW: 14 % (ref 11.5–15.5)
RDW: 13.9 % (ref 11.5–15.5)
WBC: 19.1 10*3/uL — ABNORMAL HIGH (ref 4.0–10.5)
WBC: 19.2 10*3/uL — ABNORMAL HIGH (ref 4.0–10.5)
nRBC: 0 % (ref 0.0–0.2)
nRBC: 0 % (ref 0.0–0.2)

## 2019-10-31 LAB — PROTIME-INR
INR: 4 — ABNORMAL HIGH (ref 0.8–1.2)
INR: 1.5 — ABNORMAL HIGH (ref 0.8–1.2)
INR: 1.7 — ABNORMAL HIGH (ref 0.8–1.2)
Prothrombin Time: 39.4 seconds — ABNORMAL HIGH (ref 11.4–15.2)
Prothrombin Time: 18 seconds — ABNORMAL HIGH (ref 11.4–15.2)
Prothrombin Time: 19.4 seconds — ABNORMAL HIGH (ref 11.4–15.2)

## 2019-10-31 LAB — BASIC METABOLIC PANEL
Anion gap: 9 (ref 5–15)
Anion gap: 9 (ref 5–15)
BUN: 21 mg/dL — ABNORMAL HIGH (ref 6–20)
BUN: 24 mg/dL — ABNORMAL HIGH (ref 6–20)
CO2: 18 mmol/L — ABNORMAL LOW (ref 22–32)
CO2: 20 mmol/L — ABNORMAL LOW (ref 22–32)
Calcium: 7.6 mg/dL — ABNORMAL LOW (ref 8.9–10.3)
Calcium: 7.7 mg/dL — ABNORMAL LOW (ref 8.9–10.3)
Chloride: 112 mmol/L — ABNORMAL HIGH (ref 98–111)
Chloride: 113 mmol/L — ABNORMAL HIGH (ref 98–111)
Creatinine, Ser: 1.5 mg/dL — ABNORMAL HIGH (ref 0.44–1.00)
Creatinine, Ser: 1.63 mg/dL — ABNORMAL HIGH (ref 0.44–1.00)
GFR calc Af Amer: 49 mL/min — ABNORMAL LOW (ref >60)
GFR calc Af Amer: 44 mL/min — ABNORMAL LOW (ref >60)
GFR calc non Af Amer: 42 mL/min — ABNORMAL LOW (ref >60)
GFR calc non Af Amer: 38 mL/min — ABNORMAL LOW (ref >60)
Glucose, Bld: 157 mg/dL — ABNORMAL HIGH (ref 70–99)
Glucose, Bld: 151 mg/dL — ABNORMAL HIGH (ref 70–99)
Potassium: 4.2 mmol/L (ref 3.5–5.1)
Potassium: 4.3 mmol/L (ref 3.5–5.1)
Sodium: 139 mmol/L (ref 135–145)
Sodium: 142 mmol/L (ref 135–145)

## 2019-10-31 LAB — MRSA PCR SCREENING: MRSA by PCR: NEGATIVE

## 2019-10-31 LAB — RESPIRATORY PANEL BY RT PCR (FLU A&B, COVID)
Influenza A by PCR: NEGATIVE
Influenza B by PCR: NEGATIVE
SARS Coronavirus 2 by RT PCR: NEGATIVE

## 2019-10-31 LAB — PROCALCITONIN: Procalcitonin: 0.12 ng/mL

## 2019-10-31 LAB — BRAIN NATRIURETIC PEPTIDE: B Natriuretic Peptide: 34 pg/mL (ref 0.0–100.0)

## 2019-10-31 LAB — GLUCOSE, CAPILLARY
Glucose-Capillary: 270 mg/dL — ABNORMAL HIGH (ref 70–99)
Glucose-Capillary: 151 mg/dL — ABNORMAL HIGH (ref 70–99)
Glucose-Capillary: 141 mg/dL — ABNORMAL HIGH (ref 70–99)
Glucose-Capillary: 121 mg/dL — ABNORMAL HIGH (ref 70–99)

## 2019-10-31 LAB — PREPARE RBC (CROSSMATCH)

## 2019-10-31 LAB — MAGNESIUM: Magnesium: 2.2 mg/dL (ref 1.7–2.4)

## 2019-10-31 LAB — LACTIC ACID, PLASMA
Lactic Acid, Venous: <0.3 mmol/L — ABNORMAL LOW (ref 0.5–1.9)
Lactic Acid, Venous: 5.3 mmol/L (ref 0.5–1.9)
Lactic Acid, Venous: 2 mmol/L (ref 0.5–1.9)
Lactic Acid, Venous: 1.6 mmol/L (ref 0.5–1.9)

## 2019-10-31 LAB — APTT: aPTT: 54 seconds — ABNORMAL HIGH (ref 24–36)

## 2019-10-31 LAB — LIPASE, BLOOD: Lipase: 54 U/L — ABNORMAL HIGH (ref 11–51)

## 2019-10-31 SURGERY — EMBOLIZATION
Anesthesia: Moderate Sedation

## 2019-10-31 SURGERY — LAPAROTOMY, EXPLORATORY
Anesthesia: General

## 2019-10-31 MED ORDER — CLINDAMYCIN PHOSPHATE 300 MG/50ML IV SOLN
INTRAVENOUS | Status: AC
  Administered 2019-10-31: 300 mg via INTRAVENOUS
  Filled 2019-10-31: qty 50

## 2019-10-31 MED ORDER — METRONIDAZOLE IN NACL 5-0.79 MG/ML-% IV SOLN
500.0000 mg | Freq: Once | INTRAVENOUS | Status: AC
  Administered 2019-10-31: 500 mg via INTRAVENOUS
  Filled 2019-10-31: qty 100

## 2019-10-31 MED ORDER — LACTATED RINGERS IV BOLUS
1000.0000 mL | Freq: Once | INTRAVENOUS | Status: AC
  Administered 2019-10-31: 1000 mL via INTRAVENOUS

## 2019-10-31 MED ORDER — MIDAZOLAM HCL 5 MG/5ML IJ SOLN
INTRAMUSCULAR | Status: AC
  Filled 2019-10-31: qty 5

## 2019-10-31 MED ORDER — LACTATED RINGERS IV BOLUS (SEPSIS)
1000.0000 mL | Freq: Once | INTRAVENOUS | Status: AC
  Administered 2019-10-31: 1000 mL via INTRAVENOUS

## 2019-10-31 MED ORDER — CHLORHEXIDINE GLUCONATE CLOTH 2 % EX PADS: 6.0000 | MEDICATED_PAD | Freq: Every day | CUTANEOUS | Status: DC

## 2019-10-31 MED ORDER — FENTANYL CITRATE (PF) 100 MCG/2ML IJ SOLN
INTRAMUSCULAR | Status: DC | PRN
  Administered 2019-10-31: 50 ug via INTRAVENOUS

## 2019-10-31 MED ORDER — ONDANSETRON HCL 4 MG/2ML IJ SOLN
4.0000 mg | Freq: Four times a day (QID) | INTRAMUSCULAR | Status: DC | PRN
  Administered 2019-10-31: 4 mg via INTRAVENOUS
  Filled 2019-10-31: qty 2

## 2019-10-31 MED ORDER — INSULIN ASPART 100 UNIT/ML ~~LOC~~ SOLN
0.0000 [IU] | SUBCUTANEOUS | Status: DC
  Administered 2019-10-31 (×2): 3 [IU] via SUBCUTANEOUS
  Filled 2019-10-31 (×2): qty 1

## 2019-10-31 MED ORDER — SODIUM CHLORIDE 0.9 % IV SOLN: INTRAVENOUS | Status: DC

## 2019-10-31 MED ORDER — SODIUM CHLORIDE 0.9 % IV SOLN
2.0000 g | Freq: Once | INTRAVENOUS | Status: AC
  Administered 2019-10-31: 2 g via INTRAVENOUS
  Filled 2019-10-31: qty 2

## 2019-10-31 MED ORDER — HEPARIN SODIUM (PORCINE) 1000 UNIT/ML IJ SOLN
INTRAMUSCULAR | Status: AC
  Filled 2019-10-31: qty 1

## 2019-10-31 MED ORDER — SODIUM CHLORIDE 0.9 % IV BOLUS
1000.0000 mL | Freq: Once | INTRAVENOUS | Status: AC
  Administered 2019-10-31: 1000 mL via INTRAVENOUS

## 2019-10-31 MED ORDER — MIDAZOLAM HCL 2 MG/2ML IJ SOLN
INTRAMUSCULAR | Status: DC | PRN
  Administered 2019-10-31: 1 mg via INTRAVENOUS

## 2019-10-31 MED ORDER — FENTANYL CITRATE (PF) 100 MCG/2ML IJ SOLN
INTRAMUSCULAR | Status: AC
  Filled 2019-10-31: qty 2

## 2019-10-31 MED ORDER — PROTHROMBIN COMPLEX CONC HUMAN 500 UNITS IV KIT
2000.0000 [IU] | PACK | Status: AC
  Administered 2019-10-31: 2000 [IU] via INTRAVENOUS
  Filled 2019-10-31: qty 2000

## 2019-10-31 MED ORDER — VITAMIN K1 10 MG/ML IJ SOLN
10.0000 mg | INTRAVENOUS | Status: DC
  Filled 2019-10-31: qty 1

## 2019-10-31 MED ORDER — CLINDAMYCIN PHOSPHATE 300 MG/50ML IV SOLN
INTRAVENOUS | Status: AC
  Filled 2019-10-31: qty 50

## 2019-10-31 MED ORDER — HYDROMORPHONE HCL 1 MG/ML IJ SOLN
0.5000 mg | INTRAMUSCULAR | Status: DC | PRN
  Administered 2019-10-31: 0.5 mg via INTRAVENOUS

## 2019-10-31 MED ORDER — SODIUM CHLORIDE 0.9 % IV BOLUS
INTRAVENOUS | Status: AC | PRN
  Administered 2019-10-31: 250 mL via INTRAVENOUS

## 2019-10-31 MED ORDER — CLINDAMYCIN PHOSPHATE 300 MG/50ML IV SOLN: 300.0000 mg | Freq: Once | INTRAVENOUS | Status: AC

## 2019-10-31 MED ORDER — ONDANSETRON HCL 4 MG/2ML IJ SOLN
4.0000 mg | INTRAMUSCULAR | Status: AC
  Administered 2019-10-31: 4 mg via INTRAVENOUS
  Filled 2019-10-31: qty 2

## 2019-10-31 MED ORDER — ACETAMINOPHEN 10 MG/ML IV SOLN
1000.0000 mg | Freq: Four times a day (QID) | INTRAVENOUS | Status: DC
  Administered 2019-10-31 (×2): 1000 mg via INTRAVENOUS
  Filled 2019-10-31 (×4): qty 100

## 2019-10-31 MED ORDER — SODIUM CHLORIDE 0.9% IV SOLUTION: Freq: Once | INTRAVENOUS | Status: AC

## 2019-10-31 MED ORDER — PANTOPRAZOLE SODIUM 40 MG IV SOLR
40.0000 mg | INTRAVENOUS | Status: DC
  Administered 2019-10-31: 40 mg via INTRAVENOUS
  Filled 2019-10-31: qty 40

## 2019-10-31 MED ORDER — HYDROMORPHONE HCL 1 MG/ML IJ SOLN
INTRAMUSCULAR | Status: AC
  Filled 2019-10-31: qty 0.5

## 2019-10-31 MED ORDER — LACTATED RINGERS IV SOLN: INTRAVENOUS | Status: DC

## 2019-10-31 MED ORDER — IOHEXOL 300 MG/ML  SOLN
100.0000 mL | Freq: Once | INTRAMUSCULAR | Status: AC | PRN
  Administered 2019-10-31: 100 mL via INTRAVENOUS

## 2019-10-31 MED ORDER — SODIUM CHLORIDE 0.9 % IV SOLN: 2.0000 g | Freq: Once | INTRAVENOUS | Status: DC

## 2019-10-31 MED ORDER — VITAMIN K1 10 MG/ML IJ SOLN
10.0000 mg | Freq: Once | INTRAVENOUS | Status: AC
  Administered 2019-10-31: 10 mg via INTRAVENOUS
  Filled 2019-10-31: qty 1

## 2019-10-31 MED ORDER — MORPHINE SULFATE (PF) 4 MG/ML IV SOLN
4.0000 mg | Freq: Once | INTRAVENOUS | Status: AC
  Administered 2019-10-31: 4 mg via INTRAVENOUS
  Filled 2019-10-31: qty 1

## 2019-10-31 MED ORDER — IODIXANOL 320 MG/ML IV SOLN
INTRAVENOUS | Status: DC | PRN
  Administered 2019-10-31: 50 mL via INTRA_ARTERIAL

## 2019-10-31 SURGICAL SUPPLY — 46 items
APPLICATOR CHLORAPREP 10.5 ORG (MISCELLANEOUS) ×2 IMPLANT
CANISTER SUCT 1200ML W/VALVE (MISCELLANEOUS) ×2 IMPLANT
CHLORAPREP W/TINT 26 (MISCELLANEOUS) ×2 IMPLANT
COVER WAND RF STERILE (DRAPES) ×2 IMPLANT
DRAPE LAPAROTOMY 100X77 ABD (DRAPES) ×2 IMPLANT
DRAPE LEGGINS SURG 28X43 STRL (DRAPES) IMPLANT
DRAPE UNDER BUTTOCK W/FLU (DRAPES) ×2 IMPLANT
DRSG OPSITE POSTOP 4X12 (GAUZE/BANDAGES/DRESSINGS) ×2 IMPLANT
DRSG TEGADERM 4X10 (GAUZE/BANDAGES/DRESSINGS) ×2 IMPLANT
ELECT CAUTERY BLADE 6.4 (BLADE) ×2 IMPLANT
ELECT REM PT RETURN 9FT ADLT (ELECTROSURGICAL) ×2
ELECTRODE REM PT RTRN 9FT ADLT (ELECTROSURGICAL) ×1 IMPLANT
GAUZE SPONGE 4X4 12PLY STRL (GAUZE/BANDAGES/DRESSINGS) ×2 IMPLANT
GLOVE SURG SYN 7.0 (GLOVE) ×4 IMPLANT
GLOVE SURG SYN 7.5  E (GLOVE) ×2
GLOVE SURG SYN 7.5 E (GLOVE) ×2 IMPLANT
GOWN STRL REUS W/ TWL LRG LVL3 (GOWN DISPOSABLE) ×4 IMPLANT
GOWN STRL REUS W/TWL LRG LVL3 (GOWN DISPOSABLE) ×4
KIT TURNOVER KIT A (KITS) ×2 IMPLANT
LABEL OR SOLS (LABEL) ×2 IMPLANT
LIGASURE IMPACT 36 18CM CVD LR (INSTRUMENTS) ×2 IMPLANT
NEEDLE HYPO 22GX1.5 SAFETY (NEEDLE) ×2 IMPLANT
NS IRRIG 1000ML POUR BTL (IV SOLUTION) ×2 IMPLANT
PACK BASIN MAJOR ARMC (MISCELLANEOUS) ×2 IMPLANT
PACK COLON CLEAN CLOSURE (MISCELLANEOUS) ×2 IMPLANT
SEPRAFILM MEMBRANE 5X6 (MISCELLANEOUS) ×2 IMPLANT
SPONGE LAP 18X18 RF (DISPOSABLE) ×2 IMPLANT
STAPLER CIRCULAR 29MM (STAPLE) IMPLANT
STAPLER CUT CVD 40MM BLUE (STAPLE) IMPLANT
STAPLER PROXIMATE 75MM BLUE (STAPLE) IMPLANT
STAPLER SKIN PROX 35W (STAPLE) ×2 IMPLANT
STAPLER SYS INTERNAL RELOAD SS (MISCELLANEOUS) IMPLANT
SUT MNCRL 3-0 UNDYED SH (SUTURE) ×1 IMPLANT
SUT MONOCRYL 3-0 UNDYED (SUTURE) ×1
SUT PDS AB 1 CT1 36 (SUTURE) ×2 IMPLANT
SUT PDS AB 1 TP1 96 (SUTURE) ×4 IMPLANT
SUT PROLENE 0 CT 1 30 (SUTURE) ×6 IMPLANT
SUT SILK 2 0 (SUTURE) ×1
SUT SILK 2-0 (SUTURE) ×2 IMPLANT
SUT SILK 2-0 18XBRD TIE 12 (SUTURE) ×1 IMPLANT
SUT SILK 3-0 (SUTURE) ×2 IMPLANT
SUT VIC AB 1 CTX 27 (SUTURE) ×2 IMPLANT
SUT VIC AB 3-0 SH 27 (SUTURE) ×1
SUT VIC AB 3-0 SH 27X BRD (SUTURE) ×1 IMPLANT
SYR 10ML LL (SYRINGE) ×2 IMPLANT
TRAY FOLEY MTR SLVR 16FR STAT (SET/KITS/TRAYS/PACK) ×2 IMPLANT

## 2019-10-31 SURGICAL SUPPLY — 18 items
CATH BEACON 5 .035 65 RIM TIP (CATHETERS) ×1 IMPLANT
CATH MICROCATH PRGRT 2.8F 110 (CATHETERS) IMPLANT
CATH PIG 70CM (CATHETERS) ×1 IMPLANT
CATH VS15FR (CATHETERS) ×1 IMPLANT
COIL 400 COMPLEX SOFT 3X15CM (Vascular Products) ×3 IMPLANT
COIL 400 COMPLEX STD 3X12CM (Vascular Products) ×1 IMPLANT
COVER PROBE U/S 5X48 (MISCELLANEOUS) ×1 IMPLANT
DEVICE OCCLUSION PODJ45 (Vascular Products) IMPLANT
DEVICE STARCLOSE SE CLOSURE (Vascular Products) ×1 IMPLANT
GLIDEWIRE STIFF .35X180X3 HYDR (WIRE) ×1 IMPLANT
HANDLE DETACHMENT COIL (MISCELLANEOUS) ×1 IMPLANT
MICROCATH PROGREAT 2.8F 110 CM (CATHETERS) ×2
OCCLUSION DEVICE PODJ45 (Vascular Products) ×2 IMPLANT
PACK ANGIOGRAPHY (CUSTOM PROCEDURE TRAY) ×1 IMPLANT
SHEATH BRITE TIP 5FRX11 (SHEATH) ×1 IMPLANT
SYR MEDRAD MARK 7 150ML (SYRINGE) ×1 IMPLANT
TUBING CONTRAST HIGH PRESS 72 (TUBING) ×1 IMPLANT
WIRE J 3MM .035X145CM (WIRE) ×1 IMPLANT

## 2019-10-31 NOTE — Op Note (Signed)
[facility] Percutaneous Study/Intervention Procedural Note     Surgeon(s): [employer]  Assistants: none  Pre-operative Diagnosis: 1.  Hemorrhagic shock, possible bleeding from abdominal wall hematoma as well as an intra-abdominal fluid collection in the perigastric area  Post-operative diagnosis: Same  Procedure(s) Performed: 1. Ultrasound guidance for vascular access left femoral artery 2. Catheter placement into inferior epigastric artery office the medial circumflex branch as well as catheter placement into the celiac artery and then specifically into the gastroduodenal artery for selective imaging 3. Aortogram and selective angiogram of the medial circumflex branch and inferior epigastric artery as well as selective imaging of the celiac artery and then specifically the gastroduodenal branch 4. Coil embolization of the left inferior epigastric artery with a total of five Ruby coils, 3 of which were 3 mm diameter by 15 cm soft coils, a packing coil, and then a 3 mm diameter by 12 cm standard coil 5. StarClose closure device left femoral artery  Anesthesia: Moderate conscious sedation for approximately 30 minutes using 1 mg of Versed and 50 mcg of Fentanyl  EBL: 5 cc  Fluoro Time: 8.6 minutes  Contrast: 50 cc  Indications: Patient is a 44 y.o.female with presentation to the emergency department with abdominal [name] and hemorrhagic shock.  A CT scan was performed which showed a reasonably large left abdominal wall hematoma likely from an area of a port site injuring the inferior epigastric vessel as well as a fluid collection in the abdomen largely emanating from the perigastric area where she recently had bariatric surgery. The patient is brought in for angiography for further evaluation and potential treatment. Risks and benefits are discussed and informed consent is  obtained  Procedure: The patient was identified and appropriate procedural time out was performed. The patient was then placed supine on the table and prepped and draped in the usual sterile fashion.Moderate conscious sedation was administered during a face to face encounter with the patient throughout the procedure with my supervision of the RN administering medicines and monitoring the patient's vital signs, pulse oximetry, telemetry and mental status throughout from the start of the procedure until the patient was taken to the recovery room.  Ultrasound was used to evaluate the left common femoral artery. It was patent . A digital ultrasound image was acquired. A Seldinger needle was used to access the left common femoral artery under direct ultrasound guidance and a permanent image was performed. A 0.035 J wire was advanced without resistance and a 5Fr sheath was placed. I then started by performing imaging through the left femoral sheath to evaluate the circumflex vessels to see what was feeding the epigastric vessels.  The medial circumflex vessel was clearly feeding a fairly large left inferior epigastric artery in the area of the hematoma.  Using a rim catheter I selectively cannulated the medial circumflex vessel and perform selective imaging.  Using a prograde microcatheter I was able to advance into the inferior epigastric artery up into the mid to upper abdomen.  I then performed coil embolization of the inferior epigastric artery to occlude this vessel that was within the hematoma in the left abdominal wall.  A series of Ruby coils were used to embolize from the mid to upper abdomen inferior epigastric artery down to near the pelvis area.  I started with 3 mm diameter by 15 cm length soft Ruby coils and deployed 3 of these.  A 45 cm packing coil was then used and then the last coil was a  3 mm diameter by 12 cm length standard coil.  Selective imaging following this showed successful  embolization with no further blood flow to the inferior epigastric vessel.  The prograde microcatheter and the rim catheter were then removed and I turned my attention to evaluating the celiac artery.   Pigtail catheter was placed into the aorta and an AP aortogram was performed. This demonstrated normal renal arteries and flow within the celiac and SMA that appeared fairly normal.  The origins could not be well seen in the AP projection.  We transitioned to a steep oblique projection to cannulate the celiac artery. A V S1 catheter was used to selectively cannulate the celiac artery.  This demonstrated reasonably normal flow in the celiac artery with the typical splenic artery, very small left gastric artery, the hepatic artery with the gastroduodenal artery coming off of the common hepatic artery.  No extravasation was seen on the initial imaging.  I then used the progreat microcatheter to advance out the hepatic artery and down into the gastroduodenal artery where selective imaging of the gastroduodenal artery was seen.  There was normal flow within the gastroduodenal artery with no blush or areas of bleeding identified.  Our general surgery colleagues were here what was performing the procedure, and we both felt that blind embolization of the gastroduodenal artery would be fruitless and potentially harmful to the healing of her recent gastric surgery.  I elected to terminate the procedure. The diagnostic catheter was removed. StarClose closure device was deployed in usual fashion with excellent hemostatic result. The patient was taken to the recovery room in stable condition having tolerated the procedure well.     Findings:Inferior epigastric vessel feeding off of the medial circumflex branch embolized successfully. Selective imaging the celiac artery and the gastroduodenal artery showed no extravasation appreciable in the perigastric area.  Disposition: Patient was taken to the recovery room in stable  condition having tolerated the procedure well.  Complications: None  Angela [name] 10/31/2019 9:16 AM   This note was created with Dragon Medical transcription system. Any errors in dictation are purely unintentional.

## 2019-10-31 NOTE — Progress Notes (Signed)
Inpatient Diabetes Program Recommendations  AACE/ADA: New Consensus Statement on Inpatient Glycemic Control (2015)  Target Ranges:  Prepandial:   less than 140 mg/dL      Peak postprandial:   less than 180 mg/dL (1-2 hours)      Critically ill patients:  140 - 180 mg/dL   Lab Results  Component Value Date   GLUCAP 151 (H) 10/31/2019    Review of Glycemic Control Results for TAKOTA, RACKOW "Alda Berthold" (MRN [id number]) as of 10/31/2019 13:05  Ref. Range 10/31/2019 04:13 10/31/2019 11:29  Glucose-Capillary Latest Ref Range: 70 - 99 mg/dL 270 (H) 151 (H)   Diabetes history: None Inpatient Diabetes Program Recommendations:    No history of DM noted.  If appropriate, consider adding Novolog sensitive q 4 hours.   Thanks Adah Perl, RN, BC-ADM Inpatient Diabetes Coordinator Pager [phone] (8a-5p)

## 2019-10-31 NOTE — Progress Notes (Signed)
Notified provider of need to order additional  fluid bolus. 

## 2019-10-31 NOTE — Progress Notes (Signed)
PHARMACY -  BRIEF ANTIBIOTIC NOTE   Pharmacy has received consult(s) for cefepime from an ED provider.  The patient's profile has been reviewed for ht/wt/allergies/indication/available labs.    One time order(s) placed for cefepime 2g IV x 1 (patient's PCN allx showing hives, switched from aztreonam to cefepime)  Further antibiotics/pharmacy consults should be ordered by admitting physician if indicated.                       Thank you,  Tobie Lords, PharmD, BCPS Clinical Pharmacist 10/31/2019  5:01 AM

## 2019-10-31 NOTE — Progress Notes (Signed)
Brief Progress Note I reassessed the patient at bedside. Her vitals were as follows: BP (!) 87/60   Pulse (!) 119   Temp 98.4 F (36.9 C) (Oral)   Resp (!) 23   Ht 5\' 3"  (1.6 m)   Wt 132 kg   SpO2 100%   BMI 51.55 kg/m  She continues to endorse unchanged abdominal pain. No nausea or emesis. She continues to have renal function.   I reviewed her repeat labs at 1200 today, notably listed below:  WBC - 19.1K Hgb - 8.6 (down from 9.5 at 0400 today) PLT - 257K sCr - 1.50 (down from 1.84 at 0400 today)  INR - 1.5 (improved from 4.0 at 0600 today)  Lactic Acid - 1.6  Plan I ordered an additional unit of pRBCs for transfusion We will continue IVF resuscitation Continue to closely monitor H&H, renal function, INR We have discussed her case with [facility] and she will be accepted in transfer once medically stable  Updated patient, dr Kirke Corin, and Primary RN on plan  -- Edison Simon, PA-C [facility] 10/31/2019, 1:28 PM [phone] M-F: 7am - 4pm

## 2019-10-31 NOTE — Progress Notes (Signed)
Brief Progress Note Angela [name] is a 44 y.o. female who is about 1 week postop from a bariatric surgery at [facility] who presents with 1-2 days history of abdominal pain, nausea, and emesis. Pain has progressively been worsening and now severe at 10/10. Accompanied by nausea, emesis, and lightheadedness. In brief, work up in the ED was concerning for Hgb of 9.5 (was 13.0 in 08/2019), AKI, and CT Abdomen/pelvis concerning for intra-abdominal fluid (likely hematoma) however there is concern for contrast blush. She was given aggressive IVF resuscitation, transfused, and her INR was reversed in the ED as she was on warfarin  Examination BP (!) 105/54 (BP Location: Right Wrist)   Pulse (!) 112   Temp 97.7 F (36.5 C)   Resp (!) 32   Ht 5\' 3"  (1.6 m)   Wt 132 kg   SpO2 100%   BMI 51.55 kg/m    Constitutional: Uncomfortable appearing female, moderate distress HEENT: Conjunctiva are pallor, PERRLA Pulmonary: Tachypneic, likely secondary to pain, CTAB Cardiac: Tachycardic, no m/r/g Abdomen, Soft, diffusely tender to light palpation, non-distended, no rebound/guarding Skin: Pallor, dry   Plan 44 y.o. hemodynamically unstable female with intra-abdominal fluid and abdominal wall fluid collection concerning for active extravasation  - Discussed with Dr Lucky Cowboy (vascular surgery) he is willing to attempt embolization. If this is successful, we will admit the patient and arrange transfer to [facility] once stable. If this is unsuccessful will proceed emergently to the OR for exploratory laparotomy.   Full H&P/Consult to follow  -- Edison Simon, PA-C [facility] 10/31/2019, 7:46 AM [phone] M-F: 7am - 4pm

## 2019-10-31 NOTE — ED Notes (Signed)
Surgical PA at bedside at this time.

## 2019-10-31 NOTE — ED Notes (Signed)
Date and time results received: 10/31/19 7:26 AM  (use smartphrase ".now" to insert current time)  Test: lactic Critical Value: 5.3  Name of Provider Notified: Karma Greaser  Orders Received? Or Actions Taken?: Critical results acknowledged

## 2019-10-31 NOTE — Discharge Summary (Signed)
Patient ID: Angela [name] MRN: [id number] DOB/AGE: July 05, 1976 44 y.o.  Admit date: 10/31/2019 Discharge date: 10/31/2019   Discharge Diagnoses:  Active Problems:   Hemorrhagic shock ([place])   Hemoperitoneum   Procedures:  Coil embolization of left inferior epigastric artery  Hospital Course:  Patient presented this morning to the ER with hypotension, tachycardia, and code sepsis was called.  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  There was an area of contrast extravasation in the left abdominal wall at the inferior epigastric region.  Her INR was 4, Hgb was 9.5, and Cr 1.84.  Vascular surgery was consulted and Dr. Lucky Cowboy took her for embolization of the left inferior epigastric artery.  There was no extravasation or active bleeding during angiogram.  Following procedure, she was admitted to stepdown unit for further care.  She overall received two units of pRBC, one before and one after embolization.  She received 3 L boluses in the ER, and 1L bolus in unit.  She received Kcentra preprocedure, and vitamin K post-procedure due to mildly rising INR.  Her blood pressure has been soft but stable and currently is 98/73.  She is tachycardic but has been stable as well in 100-120s.  [facility] was contacted earlier today, where her surgery had been performed.  They have graciously accepted the patient and will be transferring to stepdown unit at [facility] when a bed is available. I have examined the patient and she's stable for transfer.   Consults: Vascular surgery.  Disposition: Discharge disposition: [place] Not Defined       Discharge Instructions    Call MD for:  difficulty breathing, headache or visual disturbances   Complete by: As directed    Call MD for:  persistant nausea and vomiting   Complete by: As directed    Call MD for:  redness, tenderness, or signs of infection (pain, swelling, redness,  odor or green/yellow discharge around incision site)   Complete by: As directed    Call MD for:  severe uncontrolled pain   Complete by: As directed    Call MD for:  temperature >100.4   Complete by: As directed    Diet general   Complete by: As directed    NPO diet until changed by [facility] physician.   Discharge instructions   Complete by: As directed    Follow recommendations from [facility] regarding activity, diet, and when to resume your Coumadin.     Allergies as of 10/31/2019      Reactions   Amoxicillin Hives   Did it involve swelling of the face/tongue/throat, SOB, or low BP? No Did it involve sudden or severe rash/hives, skin peeling, or any reaction on the inside of your mouth or nose? No Did you need to seek medical attention at a hospital or doctor's office? Unknown When did it last happen?5+ years If all above answers are "NO", may proceed with cephalosporin use.   [name]    Break out in mouth   Lisinopril Cough   Nortriptyline    Unknown reaction    Orange Fruit [citrus]    Break out in mouth   Other    Sulfa eye drops- Scratched corneas   Penicillins Hives   Did it involve swelling of the face/tongue/throat, SOB, or low BP? No Did it involve sudden or severe rash/hives, skin peeling, or any reaction on the inside of your mouth or nose? No Did you need to seek  medical attention at a hospital or doctor's office? Unknown When did it last happen?5+ years If all above answers are "NO", may proceed with cephalosporin use.      Medication List    TAKE these medications   acetaminophen 500 MG tablet Commonly known as: TYLENOL Take 1,000-1,500 mg by mouth every 6 (six) hours as needed for moderate pain or headache.   albuterol 108 (90 Base) MCG/ACT inhaler Commonly known as: VENTOLIN HFA Inhale 2 puffs into the lungs every 6 (six) hours as needed for wheezing.   clindamycin 1 % lotion Commonly known as: CLEOCIN T Apply 1 application topically 2  (two) times daily as needed (acne).   cyclobenzaprine 10 MG tablet Commonly known as: FLEXERIL Take 1 tablet (10 mg total) by mouth as needed. What changed:   when to take this  reasons to take this   DentaGel 1.1 % Gel dental gel Generic drug: sodium fluoride Place 1 application onto teeth at bedtime.   diphenhydrAMINE 25 mg capsule Commonly known as: BENADRYL Take 25 mg by mouth 2 (two) times daily as needed for allergies or sleep.   Durezol 0.05 % Emul Generic drug: Difluprednate Place 1 drop into both eyes every 6 (six) hours as needed (uveitis).   enoxaparin 150 MG/ML injection Commonly known as: Lovenox Inject 0.9 mLs (135 mg total) into the skin every 12 (twelve) hours.   EpiPen 2-Pak 0.3 mg/0.3 mL Soaj injection Generic drug: EPINEPHrine Inject 0.3 mg into the muscle as needed for anaphylaxis.   fluticasone 50 MCG/ACT nasal spray Commonly known as: FLONASE Place 1 spray into both nostrils daily as needed for rhinitis.   Galcanezumab-gnlm 120 MG/ML Soaj Inject 120 mg into the skin every 28 (twenty-eight) days.   levothyroxine 150 MCG tablet Commonly known as: SYNTHROID Take 150 mcg by mouth daily before breakfast.   Linzess 72 MCG capsule Generic drug: linaclotide Take 72 mcg by mouth daily as needed (constipation).   loratadine 10 MG tablet Commonly known as: CLARITIN Take 10 mg by mouth daily.   Melatonin 3 MG Caps Take 3 mg by mouth at bedtime as needed (sleep).   multivitamin with minerals Tabs tablet Take 1 tablet by mouth daily.   ondansetron 8 MG tablet Commonly known as: ZOFRAN Take 8 mg by mouth every 8 (eight) hours as needed for nausea or vomiting.   oxybutynin 5 MG tablet Commonly known as: DITROPAN Take 5 mg by mouth at bedtime.   pantoprazole 40 MG tablet Commonly known as: PROTONIX Take 40 mg by mouth daily.   phentermine 37.5 MG tablet Commonly known as: ADIPEX-P Take by mouth daily before breakfast. 0.5 tablet    prednisoLONE acetate 1 % ophthalmic suspension Commonly known as: PRED FORTE Place 1 drop into both eyes every 4 (four) hours as needed (uveitis).   promethazine 25 MG tablet Commonly known as: PHENERGAN Take 25 mg by mouth every 6 (six) hours as needed for nausea or vomiting.   REFRESH LIQUIGEL OP Place 1 drop into both eyes daily as needed (dry eyes).   rosuvastatin 20 MG tablet Commonly known as: CRESTOR Take 20 mg by mouth daily.   Savella Titration Pack 12.5 & 25 & 50 MG Misc Generic drug: Milnacipran HCl 12.5 mg once on day 1, then 12.5 mg twice daily on days 2 to 3, 25 mg twice daily on days 4 to 7, then 50 mg twice daily thereafter.   tiZANidine 4 MG tablet Commonly known as: ZANAFLEX Take 4 mg by mouth  every 12 (twelve) hours as needed for muscle spasms.   topiramate 50 MG tablet Commonly known as: TOPAMAX Take 50 mg by mouth 2 (two) times daily.   traMADol 50 MG tablet Commonly known as: ULTRAM Take 1 tablet (50 mg total) by mouth every 6 (six) hours as needed for moderate pain.   warfarin 7.5 MG tablet Commonly known as: COUMADIN Take 1 tablet (7.5 mg total) by mouth daily at 6 PM for 2 days. What changed: additional instructions      Follow-up Information    Emelda Brothers, MD Follow up.   Specialty: General Surgery Contact information: [address] [place] [address] [phone]

## 2019-10-31 NOTE — ED Triage Notes (Addendum)
Pt to triage via w/c, appears uncomfortable, color pale, tachypnic; pt brought in by EMS from home, 9 days post gastric sleeve placement; 2 days again began having N/V and generalized abd pain; charge nurse notified of vs and pt taken immed to [facility] for further eval

## 2019-10-31 NOTE — Progress Notes (Signed)
Report given to [facility] at 2246 and they reported they would be here within the hour to pick up pt. Report given to Johnathan Hausen, RN at [facility].

## 2019-10-31 NOTE — Progress Notes (Signed)
MEDICATION RELATED CONSULT NOTE - INITIAL   Pharmacy Consult for Kcentra Indication: GI bleed s/t warfarin  Allergies  Allergen Reactions  . Amoxicillin Hives    Did it involve swelling of the face/tongue/throat, SOB, or low BP? No Did it involve sudden or severe rash/hives, skin peeling, or any reaction on the inside of your mouth or nose? No Did you need to seek medical attention at a hospital or doctor's office? Unknown When did it last happen?5+ years If all above answers are "NO", may proceed with cephalosporin use.   Marcelline Mates     Break out in mouth  . Lisinopril Cough  . Nortriptyline     Unknown reaction   . Orange Fruit [Citrus]     Break out in mouth  . Other     Sulfa eye drops- Scratched corneas  . Penicillins Hives    Did it involve swelling of the face/tongue/throat, SOB, or low BP? No Did it involve sudden or severe rash/hives, skin peeling, or any reaction on the inside of your mouth or nose? No Did you need to seek medical attention at a hospital or doctor's office? Unknown When did it last happen?5+ years If all above answers are "NO", may proceed with cephalosporin use.     Patient Measurements: Height: 5\' 3"  (160 cm) Weight: 291 lb (132 kg) IBW/kg (Calculated) : 52.4  Vital Signs: Temp: 97.7 F (36.5 C) (03/23 0441) BP: 96/58 (03/23 0631) Pulse Rate: 111 (03/23 0631) Intake/Output from previous day: 03/22 0701 - 03/23 0700 In: 2000 [IV Piggyback:2000] Out: -  Intake/Output from this shift: No intake/output data recorded.  Labs: Recent Labs    10/31/19 0403  WBC 20.2*  HGB 9.5*  HCT 29.3*  PLT 323  CREATININE 1.84*  MG 2.2  ALBUMIN 3.3*  PROT 6.3*  AST 17  ALT 15  ALKPHOS 35*  BILITOT 0.7   Estimated Creatinine Clearance: 51.9 mL/min (A) (by C-G formula based on SCr of 1.84 mg/dL (H)).   Microbiology: No results found for this or any previous visit (from the past 720 hour(s)).  Medical History: Past Medical  History:  Diagnosis Date  . Anemia   . Anxiety   . Asthma   . Depression   . Fibromyalgia   . GERD (gastroesophageal reflux disease)   . Hypertension   . Irritable bowel syndrome (IBS)   . Pseudotumor cerebri   . Pulmonary embolism ([place])   . Raynaud's disease   . Thyroid disease    hypothyroid  . Vision disturbance     Medications:  Scheduled:    Assessment: Patient in triage w/ pale color, tachypneic, N/V, abdominal pain, h/o PE on warfarin and is having a GI bleed s/t to warfarin.   Goal of Therapy:  Reversal of GI bleed and prevention of bleeding complications  Plan:  Kcentra 2000 units IV x 1 fixed dose ordered for patient greater > 100 kg. Will monitor INRs per protocol.  Tobie Lords, PharmD, BCPS Clinical Pharmacist 10/31/2019,7:10 AM

## 2019-10-31 NOTE — Consult Note (Signed)
[facility] Consult Note  MRN : [id number]  Angela [name] is a 44 y.o. (11/13/1975) female who presents with chief complaint of  Chief Complaint  Patient presents with  . Abdominal Pain  .  History of Present Illness: I am asked to see the patient by Dr. Karma Greaser in the emergency department for active hemorrhage.  The patient is 9 days postoperative from a gastric bypass surgery at [facility].  She is on anticoagulation for hypercoagulable state.  Her INR was 3 yesterday.  She came in with abdominal pain and nausea and vomiting.  Her pain is severe, 10 out of 10.  She was found to have a drop in her hemoglobin of over 3 g from her most recent level and now had a hemoglobin of 9.5.  She has received 3 L of fluid resuscitation and continues to have low blood pressure and tachycardia.  A CT scan of the abdomen pelvis performed which I have independently reviewed.  There are abdominal wall hematomas larger on the left than the right as well as an intra-abdominal fluid collection concerning for bleeding although a source was not seen.  With the areas of hemorrhage, we are consulted for further evaluation and potential embolization  Current Facility-Administered Medications  Medication Dose Route Frequency Provider Last Rate Last Admin  . clindamycin (CLEOCIN) 300 MG/50ML IVPB           . fentaNYL (SUBLIMAZE) 100 MCG/2ML injection           . heparin 1000 UNIT/ML injection           . midazolam (VERSED) 5 MG/5ML injection           . phytonadione (VITAMIN K) 10 mg in dextrose 5 % 50 mL IVPB  10 mg Intravenous STAT Hinda Kehr, MD      . prothrombin complex conc human (KCENTRA) IVPB 2,000 Units  2,000 Units Intravenous STAT Hinda Kehr, MD   2,000 Units at 10/31/19 [id number]   Current Outpatient Medications  Medication Sig Dispense Refill  . acetaminophen (TYLENOL) 500 MG tablet Take 1,000-1,500 mg by mouth every 6 (six) hours as needed for moderate pain  or headache.    . albuterol (PROVENTIL HFA;VENTOLIN HFA) 108 (90 BASE) MCG/ACT inhaler Inhale 2 puffs into the lungs every 6 (six) hours as needed for wheezing.    . Carboxymethylcellulose Sodium (REFRESH LIQUIGEL OP) Place 1 drop into both eyes daily as needed (dry eyes).    . clindamycin (CLEOCIN T) 1 % lotion Apply 1 application topically 2 (two) times daily as needed (acne).    . cyclobenzaprine (FLEXERIL) 10 MG tablet Take 1 tablet (10 mg total) by mouth as needed. (Patient taking differently: Take 10 mg by mouth 3 (three) times daily as needed for muscle spasms. ) 30 tablet 3  . Difluprednate (DUREZOL) 0.05 % EMUL Place 1 drop into both eyes every 6 (six) hours as needed (uveitis).     Marland Kitchen diphenhydrAMINE (BENADRYL) 25 mg capsule Take 25 mg by mouth 2 (two) times daily as needed for allergies or sleep.     Marland Kitchen enoxaparin (LOVENOX) 150 MG/ML injection Inject 0.9 mLs (135 mg total) into the skin every 12 (twelve) hours. 25 mL 0  . EPINEPHrine (EPIPEN 2-PAK) 0.3 mg/0.3 mL IJ SOAJ injection Inject 0.3 mg into the muscle as needed for anaphylaxis.     . fluticasone (FLONASE) 50 MCG/ACT nasal spray Place 1 spray into both nostrils daily as needed for rhinitis.    Marland Kitchen  Galcanezumab-gnlm 120 MG/ML SOAJ Inject 120 mg into the skin every 28 (twenty-eight) days.     Marland Kitchen levothyroxine (SYNTHROID) 150 MCG tablet Take 150 mcg by mouth daily before breakfast.     . linaclotide (LINZESS) 72 MCG capsule Take 72 mcg by mouth daily as needed (constipation).    Marland Kitchen loratadine (CLARITIN) 10 MG tablet Take 10 mg by mouth daily.    . Melatonin 3 MG CAPS Take 3 mg by mouth at bedtime as needed (sleep).    . Milnacipran HCl (SAVELLA TITRATION PACK) 12.5 & 25 & 50 MG MISC 12.5 mg once on day 1, then 12.5 mg twice daily on days 2 to 3, 25 mg twice daily on days 4 to 7, then 50 mg twice daily thereafter.    . Multiple Vitamin (MULTIVITAMIN WITH MINERALS) TABS tablet Take 1 tablet by mouth daily.    . ondansetron (ZOFRAN) 8 MG  tablet Take 8 mg by mouth every 8 (eight) hours as needed for nausea or vomiting.    Marland Kitchen oxybutynin (DITROPAN) 5 MG tablet Take 5 mg by mouth at bedtime.    . pantoprazole (PROTONIX) 40 MG tablet Take 40 mg by mouth daily.     . phentermine (ADIPEX-P) 37.5 MG tablet Take by mouth daily before breakfast. 0.5 tablet    . prednisoLONE acetate (PRED FORTE) 1 % ophthalmic suspension Place 1 drop into both eyes every 4 (four) hours as needed (uveitis).     . promethazine (PHENERGAN) 25 MG tablet Take 25 mg by mouth every 6 (six) hours as needed for nausea or vomiting.    . rosuvastatin (CRESTOR) 20 MG tablet Take 20 mg by mouth daily.    . sodium fluoride (DENTAGEL) 1.1 % GEL dental gel Place 1 application onto teeth at bedtime.     Marland Kitchen tiZANidine (ZANAFLEX) 4 MG tablet Take 4 mg by mouth every 12 (twelve) hours as needed for muscle spasms.    Marland Kitchen topiramate (TOPAMAX) 50 MG tablet Take 50 mg by mouth 2 (two) times daily.    . traMADol (ULTRAM) 50 MG tablet Take 1 tablet (50 mg total) by mouth every 6 (six) hours as needed for moderate pain. 12 tablet 0  . warfarin (COUMADIN) 7.5 MG tablet Take 1 tablet (7.5 mg total) by mouth daily at 6 PM for 2 days. (Patient taking differently: Take 7.5 mg by mouth daily at 6 PM. 3 days a week 10 mg , then rest of the week 7.5 mg) 2 tablet 0    Past Medical History:  Diagnosis Date  . Anemia   . Anxiety   . Asthma   . Depression   . Fibromyalgia   . GERD (gastroesophageal reflux disease)   . Hypertension   . Irritable bowel syndrome (IBS)   . Pseudotumor cerebri   . Pulmonary embolism ([place])   . Raynaud's disease   . Thyroid disease    hypothyroid  . Vision disturbance     Past Surgical History:  Procedure Laterality Date  . ABDOMINAL HYSTERECTOMY  02/06/13  . BREAST BIOPSY Right October, 2014   right breast core biopsy, fibroadenomatous changes  . BREAST BIOPSY Right December 2014   FNA retroareolar nodule consistent with fibroadenoma.  Marland Kitchen BREAST BIOPSY  Right 12/16/2016   Fibroadenoma in the retroareolar area at 6:00.  Marland Kitchen [place]  . COLONOSCOPY  2015   Dr Allen Norris  . COLONOSCOPY WITH PROPOFOL N/A 05/27/2019   Procedure: COLONOSCOPY WITH PROPOFOL;  Surgeon: [name]  B, MD;  Location: [facility] ENDOSCOPY;  Service: Endoscopy;  Laterality: N/A;  . ESOPHAGOGASTRODUODENOSCOPY (EGD) WITH PROPOFOL N/A 05/27/2019   Procedure: ESOPHAGOGASTRODUODENOSCOPY (EGD) WITH PROPOFOL;  Surgeon: Virgel Manifold, MD;  Location: [facility] ENDOSCOPY;  Service: Endoscopy;  Laterality: N/A;  . IVC FILTER INSERTION N/A 05/29/2019   Procedure: IVC FILTER INSERTION;  Surgeon: Algernon Huxley, MD;  Location: [place] [facility];  Service: Cardiovascular;  Laterality: N/A;  . RHINOPLASTY    . TONSILECTOMY, ADENOIDECTOMY, BILATERAL MYRINGOTOMY AND TUBES  2004  . UPPER GASTROINTESTINAL ENDOSCOPY  2015   Dr Allen Norris     Social History   Tobacco Use  . Smoking status: Never Smoker  . Smokeless tobacco: Never Used  Substance Use Topics  . Alcohol use: Yes    Comment: Occasionally Social Drinker  . Drug use: No     Family History  Problem Relation Age of Onset  . Depression Mother   . Migraines Mother   . Diverticulitis Mother   . Hypertension Mother   . Heart disease Father   . Diabetes Father   . Hypertension Father   . Stroke Sister   . Cancer Maternal Aunt   . Breast cancer Maternal Aunt   . Cancer Maternal Grandmother     Allergies  Allergen Reactions  . Amoxicillin Hives    Did it involve swelling of the face/tongue/throat, SOB, or low BP? No Did it involve sudden or severe rash/hives, skin peeling, or any reaction on the inside of your mouth or nose? No Did you need to seek medical attention at a hospital or doctor's office? Unknown When did it last happen?5+ years If all above answers are "NO", may proceed with cephalosporin use.   Marcelline Mates     Break out in mouth  . Lisinopril Cough  . Nortriptyline      Unknown reaction   . Orange Fruit [Citrus]     Break out in mouth  . Other     Sulfa eye drops- Scratched corneas  . Penicillins Hives    Did it involve swelling of the face/tongue/throat, SOB, or low BP? No Did it involve sudden or severe rash/hives, skin peeling, or any reaction on the inside of your mouth or nose? No Did you need to seek medical attention at a hospital or doctor's office? Unknown When did it last happen?5+ years If all above answers are "NO", may proceed with cephalosporin use.      REVIEW OF SYSTEMS (Negative unless checked)  Constitutional: [x] Weight loss  [] Fever  [] Chills Cardiac: [] Chest pain   [] Chest pressure   [] Palpitations   [] Shortness of breath when laying flat   [] Shortness of breath at rest   [] Shortness of breath with exertion. Vascular:  [] Pain in legs with walking   [] Pain in legs at rest   [] Pain in legs when laying flat   [] Claudication   [] Pain in feet when walking  [] Pain in feet at rest  [] Pain in feet when laying flat   [x] History of DVT   [x] Phlebitis   [] Swelling in legs   [] Varicose veins   [] Non-healing ulcers Pulmonary:   [] Uses home oxygen   [] Productive cough   [] Hemoptysis   [] Wheeze  [] COPD   [] Asthma Neurologic:  [] Dizziness  [] Blackouts   [] Seizures   [] History of stroke   [] History of TIA  [] Aphasia   [] Temporary blindness   [] Dysphagia   [] Weakness or numbness in arms   [] Weakness or numbness in legs Musculoskeletal:  [] Arthritis   []   Joint swelling   [] Joint pain   [] Low back pain Hematologic:  [] Easy bruising  [] Easy bleeding   [] Hypercoagulable state   [x] Anemic  [] Hepatitis Gastrointestinal:  [] Blood in stool   [] Vomiting blood  [x] Gastroesophageal reflux/heartburn   [] Difficulty swallowing.  Positive for abdominal pain Genitourinary:  [] Chronic kidney disease   [] Difficult urination  [] Frequent urination  [] Burning with urination   [] Blood in urine Skin:  [] Rashes   [] Ulcers   [] Wounds Psychological:  [x] History of anxiety    [x]  History of major depression.  Physical Examination  Vitals:   10/31/19 0713 10/31/19 0725 10/31/19 0742 10/31/19 0803  BP:  (!) 105/54 102/77 112/71  Pulse: (!) 108 (!) 112 (!) 103 100  Resp: 16 (!) 32 (!) 34 (!) 27  Temp:   98.4 F (36.9 C)   TempSrc:   Oral   SpO2: 99% 100% 100% 100%  Weight:      Height:       Body mass index is 51.55 kg/m. Gen:  WD/WN, NAD Head: [place]/AT, No temporalis wasting.  Ear/Nose/Throat: Hearing grossly intact, nares w/o erythema or drainage, oropharynx w/o Erythema/Exudate Eyes: Sclera non-icteric, conjunctiva clear Neck: Trachea midline.  No JVD.  Pulmonary:  Good air movement, respirations not labored, equal bilaterally.  Cardiac: Tachycardic Vascular:  Vessel Right Left  Radial Palpable Palpable                                   Gastrointestinal: Mild to moderately distended, tender to palpation.  Musculoskeletal: M/S 5/5 throughout.  Extremities without ischemic changes.  No deformity or atrophy.  Neurologic: Sensation grossly intact in extremities.  Symmetrical.  Speech is fluent. Motor exam as listed above. Psychiatric: Judgment intact, Mood & affect appropriate for pt's clinical situation. Dermatologic: No rashes or ulcers noted.  No cellulitis or open wounds.       CBC Lab Results  Component Value Date   WBC 20.2 (H) 10/31/2019   HGB 9.5 (L) 10/31/2019   HCT 29.3 (L) 10/31/2019   MCV 87.7 10/31/2019   PLT 323 10/31/2019    BMET    Component Value Date/Time   NA 138 10/31/2019 0403   NA 140 02/07/2013 0901   K 4.3 10/31/2019 0403   K 3.7 02/07/2013 0901   CL 108 10/31/2019 0403   CL 110 (H) 02/07/2013 0901   CO2 14 (L) 10/31/2019 0403   CO2 25 02/07/2013 0901   GLUCOSE 308 (H) 10/31/2019 0403   GLUCOSE 125 (H) 02/07/2013 0901   BUN 20 10/31/2019 0403   BUN 7 02/07/2013 0901   CREATININE 1.84 (H) 10/31/2019 0403   CREATININE 1.01 02/07/2013 0901   CALCIUM 8.4 (L) 10/31/2019 0403   CALCIUM 8.7 02/07/2013  0901   GFRNONAA 33 (L) 10/31/2019 0403   GFRNONAA >60 02/07/2013 0901   GFRAA 38 (L) 10/31/2019 0403   GFRAA >60 02/07/2013 0901   Estimated Creatinine Clearance: 51.9 mL/min (A) (by C-G formula based on SCr of 1.84 mg/dL (H)).  COAG Lab Results  Component Value Date   INR 4.0 (H) 10/31/2019   INR 1.1 10/22/2019   INR 2.0 (H) 09/06/2019    Radiology CT ABDOMEN PELVIS W CONTRAST  Result Date: 10/31/2019 CLINICAL DATA:  Nausea vomiting abdominal pain after bariatric surgery EXAM: CT ABDOMEN AND PELVIS WITH CONTRAST TECHNIQUE: Multidetector CT imaging of the abdomen and pelvis was performed using the standard protocol following bolus administration of intravenous contrast.  CONTRAST:  154mL OMNIPAQUE IOHEXOL 300 MG/ML  SOLN COMPARISON:  May 26, 2019 FINDINGS: Lower chest: The visualized heart size within normal limits. No pericardial fluid/thickening. No hiatal hernia. Bibasilar dependent atelectasis is noted. Hepatobiliary: The liver is normal in density without focal abnormality.The main portal vein is patent. No evidence of calcified gallstones, gallbladder wall thickening or biliary dilatation. Pancreas: Unremarkable. No pancreatic ductal dilatation or surrounding inflammatory changes. Spleen: Normal in size without focal abnormality. Adrenals/Urinary Tract: Both adrenal glands appear normal. The kidneys and collecting system appear normal without evidence of urinary tract calculus or hydronephrosis. Bladder is unremarkable. Stomach/Bowel: The patient is status post gastric sleeve resection. The small bowel and colon are normal in size and contour. No inflammatory changes, wall thickening, or obstructive findings.The appendix is normal. Vascular/Lymphatic: There are no enlarged mesenteric, retroperitoneal, or pelvic lymph nodes. There is an infrarenal abdominal IVC filter seen at the level of the L1 vertebral body. Reproductive: Other: Small to moderate abdominopelvic mixed density  hemoperitoneum is seen. There is also several loculated hyperdense fluid collections within the mid abdomen. There is 1 abutting the surface of the lesser curvature of the stomach measuring approximately 6.6 x 4.6 x 5.2 cm. There is a another larger multilocular collection extending into the left lower pelvis and left pericolic gutter abutting the small bowel loops and extending into the deep pelvis with hyperdense material measuring approximately a 10.1 x 7.3 x 19.7 cm. There is hyperdense contrast blush within a portion of collection in the anterior left lower abdomen, series 2, image 65. No definite source however is identified. Along the anterior abdominal wall there are small hematomas within the subcutaneous tissues along with postsurgical changes. Musculoskeletal: No acute or significant osseous findings. IMPRESSION: 1. Small to moderate abdominopelvic hemoperitoneum 2. At least two multilocular fluid collections within the abdomen, one abutting the lesser curvature of the stomach and one extending into the left pericolic gutter and deep pelvis with mixed blood products. There is also an area of hyperdense contrast blush concerning for active bleed, as described above, however no definite source is identified. These collections are likely intra-abdominal hematomas, however cannot exclude underlying concomitant infection. 3. Small anterior abdominal wall subcutaneous hematomas and postsurgical changes. 4. These results were called by telephone at the time of interpretation on 10/31/2019 at 6:40 am to provider [facility] , who verbally acknowledged these results. Electronically Signed   By: Prudencio Pair M.D.   On: 10/31/2019 06:43      Assessment/Plan 1.  Active hemorrhage.  CT scan is concerning for intra-abdominal hemorrhage as well as abdominal wall hemorrhage 9 days postoperative from a gastric bypass surgery.  I have discussed the case with general surgery.  Her INR is 3 and she is getting Kcentra  and reversal agents to get her INR down.  We will take her emergently to the angios suite and perform angiography.  I discussed that embolization of the abdominal wall which is likely feeding off the epigastric vessel should be technically feasible.  However, I am not sure this largest source of bleeding the intra-abdominal bleeding around the stomach would be less likely to be amenable to embolization.  I will take her embolize as best possible which will likely be the abdominal wall and I can evaluate the gastric situation although treatment is much less likely. 2.  Hypotension.  Secondary to #1.  3 L of fluid have improved her blood pressure but she remains somewhat tachycardic and borderline hypotensive. 3.  History of hypercoagulable  state.  Has a filter in place.  Has previous DVT and PE.  Her INR was 3 yesterday and is actually 4 this morning.  She has gotten Kcentra to reverse this.  Does have a filter in place risk of PE is low although once her bleeding has been managed and we can start DVT prophylaxis that would be favorable for reducing risk of DVT.   Leotis Pain, MD  10/31/2019 8:05 AM    This note was created with Dragon medical transcription system.  Any error is purely unintentional

## 2019-10-31 NOTE — ED Notes (Signed)
Dr. Hampton Abbot at bedside at this time.

## 2019-10-31 NOTE — ED Notes (Signed)
PT taken to CT.

## 2019-10-31 NOTE — ED Notes (Signed)
Dr. Lucky Cowboy at bedside at this time.

## 2019-10-31 NOTE — ED Notes (Signed)
Pt transported to specials by this RN at this time.

## 2019-10-31 NOTE — Progress Notes (Signed)
CODE SEPSIS - PHARMACY COMMUNICATION  **Broad Spectrum Antibiotics should be administered within 1 hour of Sepsis diagnosis**  Time Code Sepsis Called/Page Received: [id number]  Antibiotics Ordered: cefepime/flagyl  Time of 1st antibiotic administration: 0550  Additional action taken by pharmacy:   If necessary, Name of Provider/Nurse Contacted:     Tobie Lords ,PharmD Clinical Pharmacist  10/31/2019  7:21 AM

## 2019-10-31 NOTE — ED Notes (Signed)
EMERGENT BLOOD   [id number] (UNIT NUMBER)  0-NEGATIVE  EXP. DATE 11/06/2019  VOLUME 400ML  INDEPENDENTLY VERIFIED BY THIS RN AND MEGAN, RN AT RATE OF 999.

## 2019-10-31 NOTE — H&P (Signed)
Date of Admission:  10/31/2019  Reason for Admission:  Hemorrhagic shock  History of Present Illness: Angela [name] is a 44 y.o. female s/p laparoscopic sleeve gastrectomy at [facility] on 10/23/19, who presented to the ER early this morning feeling unless, and after a syncopal event at home.  She had initially been doing well after her bariatric surgery, but then over the past 2-3 days she noticed worsening pain, as well as nausea and vomiting.  She called her surgeon's office as well because she noticed bleeding from one of the incisions.  The patient also felt very weak and had syncopal event at home and fell and landed on her stomach.  She was brought by EMS to the ED and on initial workup, was found to be hypotensive to 67/52 with HR of 155.  She has given multiple liters of IV fluid boluses and her vitals improved to BP of 96/58 with HR of 111.  Her laboratory workup showed a WBC of 20.2, Hgb 9.5, Cr 1.84, CO2 14.  Later on, her lactic acid was found to be 5.3, and INR 4.0.  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  CT scan was obtained which showed hemoperitoneum, with fluid around the liver, in the lesser sac, and pelvis, with a possible source in the left inferior epigatric area with a contrast blush.  General surgery was called to evaluate patient and she was unstable for transfer.  I recommended Vascular surgery be consulted as well to evaluate the patient for possible embolization.  Dr. Lucky Cowboy was able to take the patient to vascular lab and she underwent embolization of the left inferior epigastric artery.  There was no active blush seen, and there was no bleeding from the celiac and gastroduodenal arteries.  She is being admitted to surgery team for further resuscitation.  Past Medical History: Past Medical History:  Diagnosis Date  . Anemia   . Anxiety   . Asthma   . Depression   . Fibromyalgia   . GERD (gastroesophageal reflux disease)    . Hypertension   . Irritable bowel syndrome (IBS)   . Pseudotumor cerebri   . Pulmonary embolism ([place])   . Raynaud's disease   . Thyroid disease    hypothyroid  . Vision disturbance      Past Surgical History: Past Surgical History:  Procedure Laterality Date  . ABDOMINAL HYSTERECTOMY  02/06/13  . BREAST BIOPSY Right October, 2014   right breast core biopsy, fibroadenomatous changes  . BREAST BIOPSY Right December 2014   FNA retroareolar nodule consistent with fibroadenoma.  Marland Kitchen BREAST BIOPSY Right 12/16/2016   Fibroadenoma in the retroareolar area at 6:00.  Marland Kitchen [place]  . COLONOSCOPY  2015   Dr Allen Norris  . COLONOSCOPY WITH PROPOFOL N/A 05/27/2019   Procedure: COLONOSCOPY WITH PROPOFOL;  Surgeon: Virgel Manifold, MD;  Location: [facility] ENDOSCOPY;  Service: Endoscopy;  Laterality: N/A;  . ESOPHAGOGASTRODUODENOSCOPY (EGD) WITH PROPOFOL N/A 05/27/2019   Procedure: ESOPHAGOGASTRODUODENOSCOPY (EGD) WITH PROPOFOL;  Surgeon: Virgel Manifold, MD;  Location: [facility] ENDOSCOPY;  Service: Endoscopy;  Laterality: N/A;  . IVC FILTER INSERTION N/A 05/29/2019   Procedure: IVC FILTER INSERTION;  Surgeon: Algernon Huxley, MD;  Location: [place] [facility];  Service: Cardiovascular;  Laterality: N/A;  . RHINOPLASTY    . TONSILECTOMY, ADENOIDECTOMY, BILATERAL MYRINGOTOMY AND TUBES  2004  . UPPER GASTROINTESTINAL ENDOSCOPY  2015   Dr Allen Norris  Home Medications: Prior to Admission medications   Medication Sig Start Date End Date Taking? Authorizing Provider  acetaminophen (TYLENOL) 500 MG tablet Take 1,000-1,500 mg by mouth every 6 (six) hours as needed for moderate pain or headache.    [provider]  albuterol (PROVENTIL HFA;VENTOLIN HFA) 108 (90 BASE) MCG/ACT inhaler Inhale 2 puffs into the lungs every 6 (six) hours as needed for wheezing.    [provider]  Carboxymethylcellulose Sodium (REFRESH LIQUIGEL OP) Place 1 drop into both eyes daily as needed  (dry eyes).    [provider]  clindamycin (CLEOCIN T) 1 % lotion Apply 1 application topically 2 (two) times daily as needed (acne).    [provider]  cyclobenzaprine (FLEXERIL) 10 MG tablet Take 1 tablet (10 mg total) by mouth as needed. Patient taking differently: Take 10 mg by mouth 3 (three) times daily as needed for muscle spasms.  04/10/19   Lucilla Lame, MD  Difluprednate (DUREZOL) 0.05 % EMUL Place 1 drop into both eyes every 6 (six) hours as needed (uveitis).     [provider]  diphenhydrAMINE (BENADRYL) 25 mg capsule Take 25 mg by mouth 2 (two) times daily as needed for allergies or sleep.     [provider]  enoxaparin (LOVENOX) 150 MG/ML injection Inject 0.9 mLs (135 mg total) into the skin every 12 (twelve) hours. 10/18/19   Jacquelin Hawking, NP  EPINEPHrine (EPIPEN 2-PAK) 0.3 mg/0.3 mL IJ SOAJ injection Inject 0.3 mg into the muscle as needed for anaphylaxis.     [provider]  fluticasone (FLONASE) 50 MCG/ACT nasal spray Place 1 spray into both nostrils daily as needed for rhinitis.    [provider]  Galcanezumab-gnlm 120 MG/ML SOAJ Inject 120 mg into the skin every 28 (twenty-eight) days.  01/30/19   [provider]  levothyroxine (SYNTHROID) 150 MCG tablet Take 150 mcg by mouth daily before breakfast.     [provider]  linaclotide (LINZESS) 72 MCG capsule Take 72 mcg by mouth daily as needed (constipation).    [provider]  loratadine (CLARITIN) 10 MG tablet Take 10 mg by mouth daily.    [provider]  Melatonin 3 MG CAPS Take 3 mg by mouth at bedtime as needed (sleep).    [provider]  Milnacipran HCl (SAVELLA TITRATION PACK) 12.5 & 25 & 50 MG MISC 12.5 mg once on day 1, then 12.5 mg twice daily on days 2 to 3, 25 mg twice daily on days 4 to 7, then 50 mg twice daily thereafter. 08/18/19   [provider]  Multiple Vitamin (MULTIVITAMIN WITH MINERALS) TABS  tablet Take 1 tablet by mouth daily.    [provider]  ondansetron (ZOFRAN) 8 MG tablet Take 8 mg by mouth every 8 (eight) hours as needed for nausea or vomiting.    [provider]  oxybutynin (DITROPAN) 5 MG tablet Take 5 mg by mouth at bedtime.    [provider]  pantoprazole (PROTONIX) 40 MG tablet Take 40 mg by mouth daily.  06/06/19 06/05/20  [provider]  phentermine (ADIPEX-P) 37.5 MG tablet Take by mouth daily before breakfast. 0.5 tablet 06/22/19 09/06/19  [provider]  prednisoLONE acetate (PRED FORTE) 1 % ophthalmic suspension Place 1 drop into both eyes every 4 (four) hours as needed (uveitis).     [provider]  promethazine (PHENERGAN) 25 MG tablet Take 25 mg by mouth every 6 (six) hours as needed for  nausea or vomiting.    [provider]  rosuvastatin (CRESTOR) 20 MG tablet Take 20 mg by mouth daily.    [provider]  sodium fluoride (DENTAGEL) 1.1 % GEL dental gel Place 1 application onto teeth at bedtime.     [provider]  tiZANidine (ZANAFLEX) 4 MG tablet Take 4 mg by mouth every 12 (twelve) hours as needed for muscle spasms.    [provider]  topiramate (TOPAMAX) 50 MG tablet Take 50 mg by mouth 2 (two) times daily.    [provider]  traMADol (ULTRAM) 50 MG tablet Take 1 tablet (50 mg total) by mouth every 6 (six) hours as needed for moderate pain. 05/30/19   Nicholes Mango, MD  warfarin (COUMADIN) 7.5 MG tablet Take 1 tablet (7.5 mg total) by mouth daily at 6 PM for 2 days. Patient taking differently: Take 7.5 mg by mouth daily at 6 PM. 3 days a week 10 mg , then rest of the week 7.5 mg 05/30/19 09/06/19  Nicholes Mango, MD    Allergies: Allergies  Allergen Reactions  . Amoxicillin Hives    Did it involve swelling of the face/tongue/throat, SOB, or low BP? No Did it involve sudden or severe rash/hives, skin peeling, or any reaction on the inside of your mouth or  nose? No Did you need to seek medical attention at a hospital or doctor's office? Unknown When did it last happen?5+ years If all above answers are "NO", may proceed with cephalosporin use.   Marcelline Mates     Break out in mouth  . Lisinopril Cough  . Nortriptyline     Unknown reaction   . Orange Fruit [Citrus]     Break out in mouth  . Other     Sulfa eye drops- Scratched corneas  . Penicillins Hives    Did it involve swelling of the face/tongue/throat, SOB, or low BP? No Did it involve sudden or severe rash/hives, skin peeling, or any reaction on the inside of your mouth or nose? No Did you need to seek medical attention at a hospital or doctor's office? Unknown When did it last happen?5+ years If all above answers are "NO", may proceed with cephalosporin use.     Social History:  reports that she has never smoked. She has never used smokeless tobacco. She reports current alcohol use. She reports that she does not use drugs.   Family History: Family History  Problem Relation Age of Onset  . Depression Mother   . Migraines Mother   . Diverticulitis Mother   . Hypertension Mother   . Heart disease Father   . Diabetes Father   . Hypertension Father   . Stroke Sister   . Cancer Maternal Aunt   . Breast cancer Maternal Aunt   . Cancer Maternal Grandmother     Review of Systems: Review of Systems  Constitutional: Positive for malaise/fatigue. Negative for chills and fever.  HENT: Negative for hearing loss.   Respiratory: Negative for shortness of breath.   Cardiovascular: Negative for chest pain.  Gastrointestinal: Positive for abdominal pain, nausea and vomiting.  Genitourinary: Negative for dysuria.  Musculoskeletal: Negative for myalgias.  Skin: Negative for rash.  Neurological: Negative for dizziness.  Psychiatric/Behavioral: Negative for depression.    Physical Exam BP 102/81   Pulse (!) 106   Temp 98.4 F (36.9 C) (Oral)   Resp (!) 26   Ht 5\' 3"   (1.6 m)   Wt 132 kg   SpO2  100%   BMI 51.55 kg/m  CONSTITUTIONAL: No acute distress. HEENT:  Normocephalic, atraumatic, extraocular motion intact. NECK: Trachea is midline, and there is no jugular venous distension.  RESPIRATORY:  Lungs are clear, and breath sounds are equal bilaterally. Normal respiratory effort without pathologic use of accessory muscles. CARDIOVASCULAR: Heart is regular without murmurs, gallops, or rubs. GI: The abdomen is soft, obese, non-distended, with tenderness to palpation over her midabdomen incisions as well as in the pelvic area.   MUSCULOSKELETAL:  Normal muscle strength and tone in all four extremities.  No peripheral edema or cyanosis. SKIN: Skin turgor is normal. There are no pathologic skin lesions.  NEUROLOGIC:  Motor and sensation is grossly normal.  Cranial nerves are grossly intact. PSYCH:  Alert and oriented to person, place and time. Affect is normal.  Laboratory Analysis: Results for orders placed or performed during the hospital encounter of 10/31/19 (from the past 24 hour(s))  CBC with Differential/Platelet     Status: Abnormal   Collection Time: 10/31/19  4:03 AM  Result Value Ref Range   WBC 20.2 (H) 4.0 - 10.5 K/uL   RBC 3.34 (L) 3.87 - 5.11 MIL/uL   Hemoglobin 9.5 (L) 12.0 - 15.0 g/dL   HCT 29.3 (L) 36.0 - 46.0 %   MCV 87.7 80.0 - 100.0 fL   MCH 28.4 26.0 - 34.0 pg   MCHC 32.4 30.0 - 36.0 g/dL   RDW 14.0 11.5 - 15.5 %   Platelets 323 150 - 400 K/uL   nRBC 0.0 0.0 - 0.2 %   Neutrophils Relative % 74 %   Neutro Abs 15.0 (H) 1.7 - 7.7 K/uL   Lymphocytes Relative 19 %   Lymphs Abs 3.9 0.7 - 4.0 K/uL   Monocytes Relative 6 %   Monocytes Absolute 1.1 (H) 0.1 - 1.0 K/uL   Eosinophils Relative 0 %   Eosinophils Absolute 0.0 0.0 - 0.5 K/uL   Basophils Relative 0 %   Basophils Absolute 0.1 0.0 - 0.1 K/uL   Immature Granulocytes 1 %   Abs Immature Granulocytes 0.13 (H) 0.00 - 0.07 K/uL  Magnesium     Status: None   Collection Time:  10/31/19  4:03 AM  Result Value Ref Range   Magnesium 2.2 1.7 - 2.4 mg/dL  Comprehensive metabolic panel     Status: Abnormal   Collection Time: 10/31/19  4:03 AM  Result Value Ref Range   Sodium 138 135 - 145 mmol/L   Potassium 4.3 3.5 - 5.1 mmol/L   Chloride 108 98 - 111 mmol/L   CO2 14 (L) 22 - 32 mmol/L   Glucose, Bld 308 (H) 70 - 99 mg/dL   BUN 20 6 - 20 mg/dL   Creatinine, Ser 1.84 (H) 0.44 - 1.00 mg/dL   Calcium 8.4 (L) 8.9 - 10.3 mg/dL   Total Protein 6.3 (L) 6.5 - 8.1 g/dL   Albumin 3.3 (L) 3.5 - 5.0 g/dL   AST 17 15 - 41 U/L   ALT 15 0 - 44 U/L   Alkaline Phosphatase 35 (L) 38 - 126 U/L   Total Bilirubin 0.7 0.3 - 1.2 mg/dL   GFR calc non Af Amer 33 (L) >60 mL/min   GFR calc Af Amer 38 (L) >60 mL/min   Anion gap 16 (H) 5 - 15  Lactic acid, plasma     Status: Abnormal   Collection Time: 10/31/19  4:03 AM  Result Value Ref Range   Lactic Acid, Venous <0.3 (L) 0.5 -  1.9 mmol/L  Procalcitonin     Status: None   Collection Time: 10/31/19  4:03 AM  Result Value Ref Range   Procalcitonin 0.12 ng/mL  Brain natriuretic peptide - IF patient is dyspneic     Status: None   Collection Time: 10/31/19  4:03 AM  Result Value Ref Range   B Natriuretic Peptide 34.0 0.0 - 100.0 pg/mL  Blood Culture (routine x 2)     Status: None (Preliminary result)   Collection Time: 10/31/19  4:03 AM   Specimen: BLOOD  Result Value Ref Range   Specimen Description BLOOD RIGHT AC    Special Requests      BOTTLES DRAWN AEROBIC AND ANAEROBIC Blood Culture adequate volume   Culture      NO GROWTH <12 HOURS Performed at [facility], [address] [place], [place] [address]    Report Status PENDING   Lipase, blood     Status: Abnormal   Collection Time: 10/31/19  4:03 AM  Result Value Ref Range   Lipase 54 (H) 11 - 51 U/L  Glucose, capillary     Status: Abnormal   Collection Time: 10/31/19  4:13 AM  Result Value Ref Range   Glucose-Capillary 270 (H) 70 - 99 mg/dL  Blood Culture  (routine x 2)     Status: None (Preliminary result)   Collection Time: 10/31/19  5:10 AM   Specimen: BLOOD  Result Value Ref Range   Specimen Description BLOOD RIGHT HAND    Special Requests      BOTTLES DRAWN AEROBIC AND ANAEROBIC Blood Culture results may not be optimal due to an inadequate volume of blood received in culture bottles   Culture      NO GROWTH <12 HOURS Performed at [facility], [address] [place], [place] [address]    Report Status PENDING   Lactic acid, plasma     Status: Abnormal   Collection Time: 10/31/19  6:34 AM  Result Value Ref Range   Lactic Acid, Venous 5.3 (HH) 0.5 - 1.9 mmol/L  Protime-INR     Status: Abnormal   Collection Time: 10/31/19  6:34 AM  Result Value Ref Range   Prothrombin Time 39.4 (H) 11.4 - 15.2 seconds   INR 4.0 (H) 0.8 - 1.2  Respiratory Panel by RT PCR (Flu A&B, Covid) - Nasopharyngeal Swab     Status: None   Collection Time: 10/31/19  6:34 AM   Specimen: Nasopharyngeal Swab  Result Value Ref Range   SARS Coronavirus 2 by RT PCR NEGATIVE NEGATIVE   Influenza A by PCR NEGATIVE NEGATIVE   Influenza B by PCR NEGATIVE NEGATIVE  Type and screen [place]     Status: None (Preliminary result)   Collection Time: 10/31/19  6:41 AM  Result Value Ref Range   ABO/RH(D) O POS    Antibody Screen NEG    Sample Expiration 11/03/2019,2359    Unit Number [id number]    Blood Component Type RED CELLS,LR    Unit division 00    Status of Unit REL FROM [facility]    Transfusion Status OK TO TRANSFUSE    Crossmatch Result COMPATIBLE    Unit tag comment EMERGENCY RELEASE    Unit Number [id number]    Blood Component Type RED CELLS,LR    Unit division 00    Status of Unit ISSUED    Transfusion Status OK TO TRANSFUSE    Crossmatch Result COMPATIBLE    Unit tag comment EMERGENCY RELEASE  Unit Number [id number]    Blood Component Type RED CELLS,LR    Unit division 00    Status of Unit ALLOCATED     Transfusion Status OK TO TRANSFUSE    Crossmatch Result      Compatible Performed at [facility], [address] [place], [place] [address]   Prepare RBC (crossmatch)     Status: None   Collection Time: 10/31/19  6:41 AM  Result Value Ref Range   Order Confirmation      ORDER PROCESSED BY BLOOD BANK Performed at [facility], [address] [place], [place] [address]     Imaging: CT ABDOMEN PELVIS W CONTRAST  Result Date: 10/31/2019 CLINICAL DATA:  Nausea vomiting abdominal pain after bariatric surgery EXAM: CT ABDOMEN AND PELVIS WITH CONTRAST TECHNIQUE: Multidetector CT imaging of the abdomen and pelvis was performed using the standard protocol following bolus administration of intravenous contrast. CONTRAST:  173mL OMNIPAQUE IOHEXOL 300 MG/ML  SOLN COMPARISON:  May 26, 2019 FINDINGS: Lower chest: The visualized heart size within normal limits. No pericardial fluid/thickening. No hiatal hernia. Bibasilar dependent atelectasis is noted. Hepatobiliary: The liver is normal in density without focal abnormality.The main portal vein is patent. No evidence of calcified gallstones, gallbladder wall thickening or biliary dilatation. Pancreas: Unremarkable. No pancreatic ductal dilatation or surrounding inflammatory changes. Spleen: Normal in size without focal abnormality. Adrenals/Urinary Tract: Both adrenal glands appear normal. The kidneys and collecting system appear normal without evidence of urinary tract calculus or hydronephrosis. Bladder is unremarkable. Stomach/Bowel: The patient is status post gastric sleeve resection. The small bowel and colon are normal in size and contour. No inflammatory changes, wall thickening, or obstructive findings.The appendix is normal. Vascular/Lymphatic: There are no enlarged mesenteric, retroperitoneal, or pelvic lymph nodes. There is an infrarenal abdominal IVC filter seen at the level of the L1 vertebral body. Reproductive: Other: Small  to moderate abdominopelvic mixed density hemoperitoneum is seen. There is also several loculated hyperdense fluid collections within the mid abdomen. There is 1 abutting the surface of the lesser curvature of the stomach measuring approximately 6.6 x 4.6 x 5.2 cm. There is a another larger multilocular collection extending into the left lower pelvis and left pericolic gutter abutting the small bowel loops and extending into the deep pelvis with hyperdense material measuring approximately a 10.1 x 7.3 x 19.7 cm. There is hyperdense contrast blush within a portion of collection in the anterior left lower abdomen, series 2, image 65. No definite source however is identified. Along the anterior abdominal wall there are small hematomas within the subcutaneous tissues along with postsurgical changes. Musculoskeletal: No acute or significant osseous findings. IMPRESSION: 1. Small to moderate abdominopelvic hemoperitoneum 2. At least two multilocular fluid collections within the abdomen, one abutting the lesser curvature of the stomach and one extending into the left pericolic gutter and deep pelvis with mixed blood products. There is also an area of hyperdense contrast blush concerning for active bleed, as described above, however no definite source is identified. These collections are likely intra-abdominal hematomas, however cannot exclude underlying concomitant infection. 3. Small anterior abdominal wall subcutaneous hematomas and postsurgical changes. 4. These results were called by telephone at the time of interpretation on 10/31/2019 at 6:40 am to provider [facility] , who verbally acknowledged these results. Electronically Signed   By: Prudencio Pair M.D.   On: 10/31/2019 06:43   PERIPHERAL VASCULAR CATHETERIZATION  Result Date: 10/31/2019 See op note   Assessment and Plan: This is a 44  y.o. female s/p laparoscopic sleeve gastrectomy, with post-operative bleeding and hemorrhagic shock.  Patient underwent  embolization successfully with Dr. Lucky Cowboy this morning.  At this point, there is no active bleeding on angiogram.  The patient also received K centra earlier for INR reversal.  For now, will admit to stepdown under surgery team for further resuscitation.  Will keep NPO, with IV fluids, and will repeat a full set of labs to assess her lactic acid, AKI, Hgb, and INR.  Hold any anticoagulation.  Will have appropriate pain/nausea meds, but will exclude NSAIDs for now as well.  Will also contact [facility] [place] to start arranging transfer to their hospital for continuity of care given that this is a post-op complication, as long as the patient is stable here after her IR procedure.   Melvyn Neth, MD  [facility]:  [phone]

## 2019-10-31 NOTE — ED Provider Notes (Signed)
[facility] Emergency Department Provider Note  ____________________________________________   First MD Initiated Contact with Patient 10/31/19 [phone]     (approximate)  I have reviewed the triage vital signs and the nursing notes.   HISTORY  Chief Complaint Abdominal Pain   Level 5 caveat:  history/ROS limited by acute/critical illness   HPI Angela [name] is a 44 y.o. female who is about 1 week postop from a bariatric surgery at [facility] who presents for evaluation of severe nausea and vomiting with multiple episodes over the last 2 days as well as severe lower abdominal pain that is both sharp and aching.  She said that she was doing okay after surgery but over the last 2 to 3 days she has not been able to tolerate the recommended amount of protein drinks.  She had a small amount of bleeding from one of the wounds on the right side of her abdomen but that bleeding is stopped.  She had been in communication by phone with her doctors at [facility] regarding all the symptoms but because the bleeding has stopped the prescribed Lidoderm patches and an oral nonnarcotic analgesic.  However the symptoms got even worse by tonight and she started to feel dizzy and weak and lightheaded.   She called EMS for transportation to the emergency department  Upon arrival she is hypertensive and tachycardic and near syncopal.        Past Medical History:  Diagnosis Date  . Anemia   . Anxiety   . Asthma   . Depression   . Fibromyalgia   . GERD (gastroesophageal reflux disease)   . Hypertension   . Irritable bowel syndrome (IBS)   . Pseudotumor cerebri   . Pulmonary embolism ([place])   . Raynaud's disease   . Thyroid disease    hypothyroid  . Vision disturbance     Patient Active Problem List   Diagnosis Date Noted  . Chronic low back pain (Primary Area of Pain) (Bilateral) (L>R) w/o sciatica 08/16/2019  . Chronic upper back pain (Secondary area of Pain) (Midline)  08/16/2019  . Chronic thoracic spine pain 08/16/2019  . Osteoarthritis involving multiple joints 08/16/2019  . Fibromyalgia 08/16/2019  . Chronic musculoskeletal pain 08/16/2019  . Chronic knee pain (Third area of Pain) (Bilateral) (R>L) 08/16/2019  . Cervicalgia 08/16/2019  . Cervicogenic headache 08/16/2019  . Chronic lower extremity pain (Fourth area of Pain) (Bilateral) (L>R) 08/16/2019  . Chronic shoulder pain (Fifth area of Pain) (Bilateral) 08/16/2019  . Chronic generalized pain 08/16/2019  . Chronic pain syndrome 08/15/2019  . Pharmacologic therapy 08/15/2019  . Disorder of skeletal system 08/15/2019  . Problems influencing health status 08/15/2019  . Stomach irritation   . Melena   . Hematochezia   . Internal hemorrhoids   . Diverticulosis of large intestine without diverticulitis   . GI bleed 05/26/2019  . Chronic venous insufficiency 05/22/2019  . Lymphedema 05/22/2019  . Uveitis 05/22/2019  . Cardiomegaly 03/12/2019  . Right lower lobe pulmonary nodule 01/30/2019  . Weight gain 01/30/2019  . Pleural nodule 01/19/2019  . Chronic anticoagulation (Coumadin) 01/06/2019  . 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 Pulmonary embolism with infarction ([place]) 12/20/2018  . Galactorrhea (CODE) 05/31/2018  . Fatigue 01/25/2018  . Hyperlipidemia 01/25/2018  . Hyperprolactinemia ([place]) 01/25/2018  . Hypothyroidism 01/25/2018  . Irritable bowel syndrome without diarrhea 01/25/2018  . Obesity, morbid ([place]) 01/25/2018  . Raynaud's disease 01/25/2018  . Acquired absence of other genital organ(s) 01/25/2018  .  Post-traumatic stress disorder, unspecified 01/25/2018  . Single liveborn infant, delivered by cesarean 01/25/2018  . Status post tonsillectomy 01/25/2018  . Depression 01/25/2018  . Nipple discharge in female 11/27/2016  . Asthma, mild 02/20/2016  . Herpes simplex infection of genitourinary system 12/20/2015  . Morbid obesity with body mass index of 45.0-49.9 in  adult [facility]) 12/20/2015  . Non-seasonal allergic rhinitis due to pollen 12/20/2015  . Other shoulder lesions, unspecified shoulder 07/22/2015  . Rotator cuff tendinitis 07/22/2015  . Clinical depression 06/18/2015  . Headache, migraine 06/18/2015  . 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 Calculus of kidney 02/20/2015  . Chronic sinusitis 02/20/2015  . Ringing in ear 02/20/2015  . Stuffy nose 02/20/2015  . Rheumatoid arthritis of knee ([place]) 10/09/2014  . 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[place]) 10/09/2014  . Mass of breast, right 05/24/2014  . Lump or mass in breast 05/29/2013  . Cystic breast, left 05/29/2013  . Essential (primary) hypertension 04/26/2013  . Cephalalgia 04/26/2013  . Fibroid 04/26/2013  . Muscle ache 04/26/2013  . Bacterial infection 04/26/2013  . Pain in or around eye 04/26/2013  . Urinary tract infection 04/26/2013  . Benign intracranial hypertension 12/08/2012  . Increased prolactin level 12/08/2012  . Anxiety state 06/12/2011    Past Surgical History:  Procedure Laterality Date  . ABDOMINAL HYSTERECTOMY  02/06/13  . BREAST BIOPSY Right October, 2014   right breast core biopsy, fibroadenomatous changes  . BREAST BIOPSY Right December 2014   FNA retroareolar nodule consistent with fibroadenoma.  Marland Kitchen BREAST BIOPSY Right 12/16/2016   Fibroadenoma in the retroareolar area at 6:00.  Marland Kitchen [place]  . COLONOSCOPY  2015   Dr Allen Norris  . COLONOSCOPY WITH PROPOFOL N/A 05/27/2019   Procedure: COLONOSCOPY WITH PROPOFOL;  Surgeon: Virgel Manifold, MD;  Location: [facility] ENDOSCOPY;  Service: Endoscopy;  Laterality: N/A;  . ESOPHAGOGASTRODUODENOSCOPY (EGD) WITH PROPOFOL N/A 05/27/2019   Procedure: ESOPHAGOGASTRODUODENOSCOPY (EGD) WITH PROPOFOL;  Surgeon: Virgel Manifold, MD;  Location: [facility] ENDOSCOPY;  Service: Endoscopy;  Laterality: N/A;  . IVC FILTER INSERTION N/A 05/29/2019   Procedure: IVC FILTER INSERTION;   Surgeon: Algernon Huxley, MD;  Location: [place] [facility];  Service: Cardiovascular;  Laterality: N/A;  . RHINOPLASTY    . TONSILECTOMY, ADENOIDECTOMY, BILATERAL MYRINGOTOMY AND TUBES  2004  . UPPER GASTROINTESTINAL ENDOSCOPY  2015   Dr Allen Norris    Prior to Admission medications   Medication Sig Start Date End Date Taking? Authorizing Provider  acetaminophen (TYLENOL) 500 MG tablet Take 1,000-1,500 mg by mouth every 6 (six) hours as needed for moderate pain or headache.    [provider]  albuterol (PROVENTIL HFA;VENTOLIN HFA) 108 (90 BASE) MCG/ACT inhaler Inhale 2 puffs into the lungs every 6 (six) hours as needed for wheezing.    [provider]  Carboxymethylcellulose Sodium (REFRESH LIQUIGEL OP) Place 1 drop into both eyes daily as needed (dry eyes).    [provider]  clindamycin (CLEOCIN T) 1 % lotion Apply 1 application topically 2 (two) times daily as needed (acne).    [provider]  cyclobenzaprine (FLEXERIL) 10 MG tablet Take 1 tablet (10 mg total) by mouth as needed. Patient taking differently: Take 10 mg by mouth 3 (three) times daily as needed for muscle spasms.  04/10/19   Lucilla Lame, MD  Difluprednate (DUREZOL) 0.05 % EMUL Place 1 drop into both eyes every 6 (six) hours as needed (uveitis).     [provider]  diphenhydrAMINE (BENADRYL) 25 mg capsule Take 25 mg by mouth 2 (two) times daily as needed for allergies or sleep.     [provider]  enoxaparin (LOVENOX) 150 MG/ML injection Inject 0.9 mLs (135 mg total) into the skin every 12 (twelve) hours. 10/18/19   Jacquelin Hawking, NP  EPINEPHrine (EPIPEN 2-PAK) 0.3 mg/0.3 mL IJ SOAJ injection Inject 0.3 mg into the muscle as needed for anaphylaxis.     [provider]  fluticasone (FLONASE) 50 MCG/ACT nasal spray Place 1 spray into both nostrils daily as needed for rhinitis.    [provider]  Galcanezumab-gnlm 120 MG/ML SOAJ Inject 120 mg into the skin  every 28 (twenty-eight) days.  01/30/19   [provider]  levothyroxine (SYNTHROID) 150 MCG tablet Take 150 mcg by mouth daily before breakfast.     [provider]  linaclotide (LINZESS) 72 MCG capsule Take 72 mcg by mouth daily as needed (constipation).    [provider]  loratadine (CLARITIN) 10 MG tablet Take 10 mg by mouth daily.    [provider]  Melatonin 3 MG CAPS Take 3 mg by mouth at bedtime as needed (sleep).    [provider]  Milnacipran HCl (SAVELLA TITRATION PACK) 12.5 & 25 & 50 MG MISC 12.5 mg once on day 1, then 12.5 mg twice daily on days 2 to 3, 25 mg twice daily on days 4 to 7, then 50 mg twice daily thereafter. 08/18/19   [provider]  Multiple Vitamin (MULTIVITAMIN WITH MINERALS) TABS tablet Take 1 tablet by mouth daily.    [provider]  ondansetron (ZOFRAN) 8 MG tablet Take 8 mg by mouth every 8 (eight) hours as needed for nausea or vomiting.    [provider]  oxybutynin (DITROPAN) 5 MG tablet Take 5 mg by mouth at bedtime.    [provider]  pantoprazole (PROTONIX) 40 MG tablet Take 40 mg by mouth daily.  06/06/19 06/05/20  [provider]  phentermine (ADIPEX-P) 37.5 MG tablet Take by mouth daily before breakfast. 0.5 tablet 06/22/19 09/06/19  [provider]  prednisoLONE acetate (PRED FORTE) 1 % ophthalmic suspension Place 1 drop into both eyes every 4 (four) hours as needed (uveitis).     [provider]  promethazine (PHENERGAN) 25 MG tablet Take 25 mg by mouth every 6 (six) hours as needed for nausea or vomiting.    [provider]  rosuvastatin (CRESTOR) 20 MG tablet Take 20 mg by mouth daily.    [provider]  sodium fluoride (DENTAGEL) 1.1 % GEL dental gel Place 1 application onto teeth at bedtime.     [provider]  tiZANidine (ZANAFLEX) 4 MG tablet Take 4 mg by mouth every 12 (twelve) hours as needed for muscle spasms.     [provider]  topiramate (TOPAMAX) 50 MG tablet Take 50 mg by mouth 2 (two) times daily.    [provider]  traMADol (ULTRAM) 50 MG tablet Take 1 tablet (50 mg total) by mouth every 6 (six) hours as needed for moderate pain. 05/30/19   Nicholes Mango, MD  warfarin (COUMADIN) 7.5 MG tablet Take 1 tablet (7.5 mg total) by mouth daily at 6 PM for 2 days. Patient taking differently: Take 7.5 mg by mouth daily at 6 PM. 3 days a week 10 mg , then rest of the week 7.5 mg 05/30/19 09/06/19  Nicholes Mango, MD    Allergies Amoxicillin, [name], Lisinopril, Nortriptyline,  Orange fruit [citrus], Other, and Penicillins  Family History  Problem Relation Age of Onset  . Depression Mother   . Migraines Mother   . Diverticulitis Mother   . Hypertension Mother   . Heart disease Father   . Diabetes Father   . Hypertension Father   . Stroke Sister   . Cancer Maternal Aunt   . Breast cancer Maternal Aunt   . Cancer Maternal Grandmother     Social History Social History   Tobacco Use  . Smoking status: Never Smoker  . Smokeless tobacco: Never Used  Substance Use Topics  . Alcohol use: Yes    Comment: Occasionally Social Drinker  . Drug use: No    Review of Systems Constitutional: Generalized weakness and general malaise.  No fever/chills Eyes: No visual changes. ENT: No sore throat. Cardiovascular: Denies chest pain. Respiratory: Denies shortness of breath. Gastrointestinal: Severe abdominal pain has been worsening over the last couple of days with severe and persistent nausea and vomiting. Genitourinary: Negative for dysuria. Musculoskeletal: Negative for neck pain.  Negative for back pain. Integumentary: Negative for rash. Neurological: Negative for headaches, focal weakness or numbness.   ____________________________________________   PHYSICAL EXAM:  VITAL SIGNS: ED Triage Vitals  Enc Vitals Group     BP 10/31/19 0352 (!) 67/52     Pulse Rate 10/31/19 0352  (!) 155     Resp 10/31/19 0352 (!) 30     Temp 10/31/19 0352 98.1 F (36.7 C)     Temp src --      SpO2 10/31/19 0352 100 %     Weight 10/31/19 0354 132 kg (291 lb)     Height 10/31/19 0354 1.6 m (5\' 3" )     Head Circumference --      Peak Flow --      Pain Score 10/31/19 0354 9     Pain Loc --      Pain Edu? --      Excl. in [place]? --     Constitutional: Alert and oriented.  Ill-appearing. Eyes: Conjunctivae are normal.  Head: Atraumatic. Nose: No congestion/rhinnorhea. Mouth/Throat: Patient is wearing a mask. Neck: No stridor.  No meningeal signs.   Cardiovascular: Tachycardia, regular rhythm.  Decreased peripheral circulation. Grossly normal heart sounds. Respiratory: Normal respiratory effort.  No retractions. Gastrointestinal: Obese.  Well-appearing surgical wounds with no dehiscence nor active bleeding nor purulence.  Generalized tenderness to palpation throughout the abdomen with worse tenderness on the right side near one of the surgical wounds, the one that was bleeding over the last couple of days. Musculoskeletal: No lower extremity tenderness nor edema. No gross deformities of extremities. Neurologic:  Normal speech and language. No gross focal neurologic deficits are appreciated.  Skin:  Skin is pale for her skin tone, warm, dry and intact. Psychiatric: Mood and affect are normal. Speech and behavior are normal.  ____________________________________________   LABS (all labs ordered are listed, but only abnormal results are displayed)  Labs Reviewed  CBC WITH DIFFERENTIAL/PLATELET - Abnormal; Notable for the following components:      Result Value   WBC 20.2 (*)    RBC 3.34 (*)    Hemoglobin 9.5 (*)    HCT 29.3 (*)    Neutro Abs 15.0 (*)    Monocytes Absolute 1.1 (*)    Abs Immature Granulocytes 0.13 (*)    All other components within normal limits  COMPREHENSIVE METABOLIC PANEL - Abnormal; Notable for the following components:   CO2 14 (*)  Glucose, Bld 308  (*)    Creatinine, Ser 1.84 (*)    Calcium 8.4 (*)    Total Protein 6.3 (*)    Albumin 3.3 (*)    Alkaline Phosphatase 35 (*)    GFR calc non Af Amer 33 (*)    GFR calc Af Amer 38 (*)    Anion gap 16 (*)    All other components within normal limits  GLUCOSE, CAPILLARY - Abnormal; Notable for the following components:   Glucose-Capillary 270 (*)    All other components within normal limits  LACTIC ACID, PLASMA - Abnormal; Notable for the following components:   Lactic Acid, Venous <0.3 (*)    All other components within normal limits  LACTIC ACID, PLASMA - Abnormal; Notable for the following components:   Lactic Acid, Venous 5.3 (*)    All other components within normal limits  LIPASE, BLOOD - Abnormal; Notable for the following components:   Lipase 54 (*)    All other components within normal limits  CULTURE, BLOOD (ROUTINE X 2)  CULTURE, BLOOD (ROUTINE X 2)  URINE CULTURE  RESPIRATORY PANEL BY RT PCR (FLU A&B, COVID)  MAGNESIUM  PROCALCITONIN  BRAIN NATRIURETIC PEPTIDE  URINALYSIS, ROUTINE W REFLEX MICROSCOPIC  PROTIME-INR  URINALYSIS, COMPLETE (UACMP) WITH MICROSCOPIC  CBG MONITORING, ED  POC URINE PREG, ED  TYPE AND SCREEN  PREPARE RBC (CROSSMATCH)   ____________________________________________  EKG  ED ECG REPORT I, Hinda Kehr, the attending physician, personally viewed and interpreted this ECG.  Date: 10/31/2019 EKG Time: 07:06 Rate: 116 Rhythm: sinus tachycardia QRS Axis: normal Intervals: normal ST/T Wave abnormalities: normal Narrative Interpretation: no evidence of acute ischemia  ____________________________________________  RADIOLOGY I, Hinda Kehr, personally discussed these images and results by phone with the on-call radiologist and used this discussion as part of my medical decision making.    ED MD interpretation: The patient has active bleeding and extensive hemoperitoneum which appears to be coming from a blood vessel in the abdominal  wall.  There is also 2 areas of loculation where infection cannot be ruled out but the more critical and acute issue is the active bleeding and hemoperitoneum.  Official radiology report(s): CT ABDOMEN PELVIS W CONTRAST  Result Date: 10/31/2019 CLINICAL DATA:  Nausea vomiting abdominal pain after bariatric surgery EXAM: CT ABDOMEN AND PELVIS WITH CONTRAST TECHNIQUE: Multidetector CT imaging of the abdomen and pelvis was performed using the standard protocol following bolus administration of intravenous contrast. CONTRAST:  132mL OMNIPAQUE IOHEXOL 300 MG/ML  SOLN COMPARISON:  May 26, 2019 FINDINGS: Lower chest: The visualized heart size within normal limits. No pericardial fluid/thickening. No hiatal hernia. Bibasilar dependent atelectasis is noted. Hepatobiliary: The liver is normal in density without focal abnormality.The main portal vein is patent. No evidence of calcified gallstones, gallbladder wall thickening or biliary dilatation. Pancreas: Unremarkable. No pancreatic ductal dilatation or surrounding inflammatory changes. Spleen: Normal in size without focal abnormality. Adrenals/Urinary Tract: Both adrenal glands appear normal. The kidneys and collecting system appear normal without evidence of urinary tract calculus or hydronephrosis. Bladder is unremarkable. Stomach/Bowel: The patient is status post gastric sleeve resection. The small bowel and colon are normal in size and contour. No inflammatory changes, wall thickening, or obstructive findings.The appendix is normal. Vascular/Lymphatic: There are no enlarged mesenteric, retroperitoneal, or pelvic lymph nodes. There is an infrarenal abdominal IVC filter seen at the level of the L1 vertebral body. Reproductive: Other: Small to moderate abdominopelvic mixed density hemoperitoneum is seen. There is also several loculated hyperdense  fluid collections within the mid abdomen. There is 1 abutting the surface of the lesser curvature of the stomach  measuring approximately 6.6 x 4.6 x 5.2 cm. There is a another larger multilocular collection extending into the left lower pelvis and left pericolic gutter abutting the small bowel loops and extending into the deep pelvis with hyperdense material measuring approximately a 10.1 x 7.3 x 19.7 cm. There is hyperdense contrast blush within a portion of collection in the anterior left lower abdomen, series 2, image 65. No definite source however is identified. Along the anterior abdominal wall there are small hematomas within the subcutaneous tissues along with postsurgical changes. Musculoskeletal: No acute or significant osseous findings. IMPRESSION: 1. Small to moderate abdominopelvic hemoperitoneum 2. At least two multilocular fluid collections within the abdomen, one abutting the lesser curvature of the stomach and one extending into the left pericolic gutter and deep pelvis with mixed blood products. There is also an area of hyperdense contrast blush concerning for active bleed, as described above, however no definite source is identified. These collections are likely intra-abdominal hematomas, however cannot exclude underlying concomitant infection. 3. Small anterior abdominal wall subcutaneous hematomas and postsurgical changes. 4. These results were called by telephone at the time of interpretation on 10/31/2019 at 6:40 am to provider [facility] , who verbally acknowledged these results. Electronically Signed   By: Prudencio Pair M.D.   On: 10/31/2019 06:43    ____________________________________________   PROCEDURES   Procedure(s) performed (including Critical Care):  .Critical Care Performed by: Hinda Kehr, MD Authorized by: Hinda Kehr, MD   Critical care provider statement:    Critical care time (minutes):  70   Critical care time was exclusive of:  Separately billable procedures and treating other patients   Critical care was necessary to treat or prevent imminent or life-threatening  deterioration of the following conditions:  Shock, circulatory failure and renal failure   Critical care was time spent personally by me on the following activities:  Development of treatment plan with patient or surrogate, discussions with consultants, evaluation of patient's response to treatment, examination of patient, obtaining history from patient or surrogate, ordering and performing treatments and interventions, ordering and review of laboratory studies, ordering and review of radiographic studies, pulse oximetry, re-evaluation of patient's condition and review of old charts     ____________________________________________   [place] / MDM / [place] / ED COURSE  As part of my medical decision making, I reviewed the following data within the [place] notes reviewed and incorporated, Labs reviewed , EKG interpreted , Old chart reviewed, Discussed with admitting physician , A consult was requested and obtained from this/these consultant(s) (Vascular surgery and general surgery) and Notes from prior ED visits   Differential diagnosis includes, but is not limited to, dehydration plus or minus acute kidney injury/acute renal failure, sepsis with intra-abdominal infection, less likely pulmonary embolism.  The patient is having no respiratory symptoms.  She is afebrile and I believe her presentation is most likely due to volume depletion given her recent bariatric surgery and inability to tolerate oral intake.  I will start fluid resuscitation with 1 L normal saline IV bolus and have initiating a basic lab work-up.  I am also ordering morphine 4 g IV and Zofran 4 mg IV and I will reassess.      Clinical Course as of Oct 31 746  Tue Oct 31, 2019  0420 Glucose-Capillary(!): 270 [CF]  [phone] Patient's labs are  concerning for a white blood cell count of 20.2.  I looked back through her record and saw that her white blood cell count at discharge after  her surgery was 11.  Her hemoglobin has dropped as well by a couple of points although this could be normal after the surgery.  Her creatinine is up at 1.84.  At this point I am concerned that her presentation represents sepsis or septic shock from intra-abdominal source, not just volume depletion.  I have made her code sepsis and ordering aztreonam 2 g IV and metronidazole 500 mg IV as per protocol for penicillin allergic patients.  I have ordered another liter of lactated Ringer's to target ideal body weight (for which the total goal would be 1.6 L of crystalloids but I am giving a total of 2 L right now).  I will continue giving crystalloid as needed.  I also called CT to make them aware that I ordered a CT scan with IV contrast for reduced flow given the apparent acute kidney injury.   [CF]  0500 Of note her baseline creatinine is around 0.9, so her current creatinine of 1.84 represents acute kidney injury/acute renal failure likely in the setting of volume depletion but possibly also secondary to sepsis.   [CF]  0500   Lactic acid and procalcitonin are pending.   [CF]  0525 Lactic Acid, Venous(!): <0.3 [CF]  0533 Generally reassuring procalcitonin  Procalcitonin: 0.12 [CF]  0619 Lipase(!): 54 [CF]  0656 Radiologist called to let me know that the patient has a large amount of blood in her abdomen, 2 loculated areas that are difficult to identify, but with active extravasation.  I updated the patient and she tell me that her INR yesterday was 3.  Because she is greater than 100 kg with active bleeding and persistent hypotension, I ordered anticoagulation reversal per protocol of Kcentra and vitamin K.  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  She was taken off of anticoagulation before her recent surgery.  I have also ordered 1 unit of emergency release  O- blood for immediate administration while we are waiting the type and screen results.  I had my usual risk and benefit discussions regarding blood transfusion and the patient provided verbal emergency consent.  I then called and spoke by phone with Dr. Hampton Abbot with general surgery and explained the situation.  He is reviewing the CT scan and either him or one of his colleagues will come to the emergency department shortly to evaluate the patient.  He also recommended that I call vascular surgery to discuss the possibility of embolization to stabilize the patient.  I have paged Dr. Lazaro Arms with vascular surgery.  Dr. Hampton Abbot also agreed with my plan for anticoagulation reversal and emergency bladder ministration.   [CF]  [id number] Sepsis reassessment completed.  The patient is still hypotensive but in the meantime as documented above I received the information that the patient is actively bleeding in her abdomen.  This explains her clinical presentation.  I think it is reasonable to continue with antibiotics as planned at this point given her leukocytosis of 20 but she is afebrile and her presentation is most consistent with hemorrhagic shock from the intra-abdominal bleeding.  I will hold off on pressors at this time given that the main issue is volume resuscitation and surgical intervention.   [CF]  [phone]  Difficult to interpret in the setting of an initially low lactic acid.  This brings up the possibility that one of the 2 measurements was erroneous.  She remains afebrile and while again this could be secondary to sepsis, I think it is much more likely due to hemorrhagic shock and hypoperfusion with persistent hypotension.  She is still being fluid resuscitated with lactated Ringer's and emergency release blood.  Lactic Acid, Venous(!!): 5.3 [CF]  0732 Dr. Lucky Cowboy is willing to take the patient to special procedures for embolization, and Dr. Hampton Abbot will also take the patient to the operating room.  Shon Millet  (surgery) is evaluating the patient in the ED and they are working on who will admit her.   [CF]  [phone] The plan is for general surgery to admit the patient but she is going to special procedures for embolization now.  Surgery will assume care after the vascular procedure.   [CF]    Clinical Course User Index [CF] Hinda Kehr, MD     ____________________________________________  FINAL CLINICAL IMPRESSION(S) / ED DIAGNOSES  Final diagnoses:  Hemoperitoneum  Hemorrhagic shock (HCC)  Leukocytosis, unspecified type  Warfarin anticoagulation  Postoperative hemorrhage involving circulatory system following non-circulatory system procedure  Acute renal failure, unspecified acute renal failure type ([place])  Elevated lactic acid level     MEDICATIONS GIVEN DURING THIS VISIT:  Medications  prothrombin complex conc human (KCENTRA) IVPB 2,000 Units (has no administration in time range)  phytonadione (VITAMIN K) 10 mg in dextrose 5 % 50 mL IVPB (has no administration in time range)  clindamycin (CLEOCIN) 300 MG/50ML IVPB (has no administration in time range)  fentaNYL (SUBLIMAZE) 100 MCG/2ML injection (has no administration in time range)  heparin 1000 UNIT/ML injection (has no administration in time range)  midazolam (VERSED) 5 MG/5ML injection (has no administration in time range)  clindamycin (CLEOCIN) 300 MG/50ML IVPB (has no administration in time range)  sodium chloride 0.9 % bolus 1,000 mL (0 mLs Intravenous Stopped 10/31/19 0555)  ondansetron (ZOFRAN) injection 4 mg (4 mg Intravenous Given 10/31/19 0419)  morphine 4 MG/ML injection 4 mg (4 mg Intravenous Given 10/31/19 0424)  iohexol (OMNIPAQUE) 300 MG/ML solution 100 mL (100 mLs Intravenous Contrast Given 10/31/19 0610)  lactated ringers bolus 1,000 mL (0 mLs Intravenous Stopped 10/31/19 0620)  metroNIDAZOLE (FLAGYL) IVPB 500 mg (0 mg Intravenous Stopped 10/31/19 0624)  ceFEPIme (MAXIPIME) 2 g in sodium chloride 0.9 % 100 mL IVPB (0 g  Intravenous Stopped 10/31/19 0642)  lactated ringers bolus 1,000 mL (1,000 mLs Intravenous New Bag/Given 10/31/19 0631)     ED Discharge Orders    None      *Please note:  Angela [name] was evaluated in Emergency Department on 10/31/2019 for the symptoms described in the history of present illness. She was evaluated in the context of the global COVID-19 pandemic, which necessitated consideration that the patient might be at risk for infection with the SARS-CoV-2 virus that causes COVID-19. Institutional protocols and algorithms that pertain to the evaluation of patients at risk for COVID-19 are in a state of rapid change based on information released by regulatory bodies including the CDC and federal and state organizations. These policies and algorithms were followed during the patient's care in the ED.  Some ED evaluations and interventions may be delayed as a result of limited staffing during the pandemic.*  Note:  This document was prepared using Dragon voice recognition software and may include unintentional dictation errors.   Hinda Kehr, MD 10/31/19  0750  

## 2019-11-01 ENCOUNTER — Ambulatory Visit: Payer: Medicare Other | Admitting: Gastroenterology

## 2019-11-01 DIAGNOSIS — D62 Acute posthemorrhagic anemia: Secondary | ICD-10-CM | POA: Insufficient documentation

## 2019-11-01 DIAGNOSIS — Z903 Acquired absence of stomach [part of]: Secondary | ICD-10-CM | POA: Insufficient documentation

## 2019-11-01 LAB — BPAM RBC
Blood Product Expiration Date: 202103252359
Blood Product Expiration Date: 202103292359
Blood Product Expiration Date: 202104142359
ISSUE DATE / TIME: 202103230701
ISSUE DATE / TIME: 202103231055
ISSUE DATE / TIME: 202103231329
Unit Type and Rh: 5100
Unit Type and Rh: 5100
Unit Type and Rh: 9500

## 2019-11-01 LAB — TYPE AND SCREEN
ABO/RH(D): O POS
Antibody Screen: NEGATIVE
Unit division: 0
Unit division: 0
Unit division: 0

## 2019-11-01 MED ORDER — HYDROMORPHONE HCL 1 MG/ML IJ SOLN
0.25 | INTRAMUSCULAR | Status: DC
Start: ? — End: 2019-11-01

## 2019-11-01 MED ORDER — LACTATED RINGERS IV SOLN
INTRAVENOUS | Status: DC
Start: ? — End: 2019-11-01

## 2019-11-01 MED ORDER — PROMETHAZINE HCL 25 MG/ML IJ SOLN
6.25 | INTRAMUSCULAR | Status: DC
Start: ? — End: 2019-11-01

## 2019-11-01 MED ORDER — GABAPENTIN 300 MG PO CAPS
300.00 | ORAL_CAPSULE | ORAL | Status: DC
Start: 2019-11-05 — End: 2019-11-01

## 2019-11-01 MED ORDER — OXYCODONE HCL 5 MG PO TABS
5.00 | ORAL_TABLET | ORAL | Status: DC
Start: ? — End: 2019-11-01

## 2019-11-01 MED ORDER — LIDOCAINE 5 % EX PTCH
1.00 | MEDICATED_PATCH | CUTANEOUS | Status: DC
Start: 2019-11-14 — End: 2019-11-01

## 2019-11-01 MED ORDER — MONTELUKAST SODIUM 10 MG PO TABS
10.00 | ORAL_TABLET | ORAL | Status: DC
Start: 2019-11-01 — End: 2019-11-01

## 2019-11-01 MED ORDER — ACETAMINOPHEN 325 MG PO TABS
650.00 | ORAL_TABLET | ORAL | Status: DC
Start: 2019-11-01 — End: 2019-11-01

## 2019-11-01 MED ORDER — FLUTICASONE PROPIONATE 50 MCG/ACT NA SUSP
1.00 | NASAL | Status: DC
Start: ? — End: 2019-11-01

## 2019-11-01 MED ORDER — TOPIRAMATE 25 MG PO TABS
50.00 | ORAL_TABLET | ORAL | Status: DC
Start: 2019-11-13 — End: 2019-11-01

## 2019-11-01 MED ORDER — ONDANSETRON HCL 4 MG/2ML IJ SOLN
4.00 | INTRAMUSCULAR | Status: DC
Start: ? — End: 2019-11-01

## 2019-11-01 MED ORDER — PANTOPRAZOLE SODIUM 40 MG PO TBEC
40.00 | DELAYED_RELEASE_TABLET | ORAL | Status: DC
Start: 2019-11-14 — End: 2019-11-01

## 2019-11-01 MED ORDER — ALBUTEROL SULFATE HFA 108 (90 BASE) MCG/ACT IN AERS
2.00 | INHALATION_SPRAY | RESPIRATORY_TRACT | Status: DC
Start: ? — End: 2019-11-01

## 2019-11-01 MED ORDER — LEVOTHYROXINE SODIUM 150 MCG PO TABS
150.00 | ORAL_TABLET | ORAL | Status: DC
Start: 2019-11-14 — End: 2019-11-01

## 2019-11-01 MED ORDER — DEXTROSE 50 % IV SOLN
12.50 | INTRAVENOUS | Status: DC
Start: ? — End: 2019-11-01

## 2019-11-01 MED ORDER — METHOCARBAMOL 750 MG PO TABS
750.00 | ORAL_TABLET | ORAL | Status: DC
Start: 2019-11-05 — End: 2019-11-01

## 2019-11-01 MED ORDER — LINACLOTIDE 72 MCG PO CAPS
72.00 | ORAL_CAPSULE | ORAL | Status: DC
Start: ? — End: 2019-11-01

## 2019-11-01 MED ORDER — ACETAMINOPHEN 325 MG PO TABS
975.00 | ORAL_TABLET | ORAL | Status: DC
Start: 2019-11-05 — End: 2019-11-01

## 2019-11-01 MED ORDER — LIDOCAINE HCL 1 % IJ SOLN
0.50 | INTRAMUSCULAR | Status: DC
Start: ? — End: 2019-11-01

## 2019-11-01 MED ORDER — GLUCAGON (RDNA) 1 MG IJ KIT
1.00 | PACK | INTRAMUSCULAR | Status: DC
Start: ? — End: 2019-11-01

## 2019-11-05 LAB — CULTURE, BLOOD (ROUTINE X 2)
Culture: NO GROWTH
Culture: NO GROWTH
Special Requests: ADEQUATE

## 2019-11-05 MED ORDER — ALBUTEROL SULFATE HFA 108 (90 BASE) MCG/ACT IN AERS
2.00 | INHALATION_SPRAY | RESPIRATORY_TRACT | Status: DC
Start: 2019-11-13 — End: 2019-11-05

## 2019-11-05 MED ORDER — NYSTATIN 100000 UNIT/GM EX POWD
CUTANEOUS | Status: DC
Start: 2019-11-13 — End: 2019-11-05

## 2019-11-05 MED ORDER — DEXTROSE IN LACTATED RINGERS 5 % IV SOLN
INTRAVENOUS | Status: DC
Start: ? — End: 2019-11-05

## 2019-11-05 MED ORDER — SUCRALFATE 1 G PO TABS
1.00 | ORAL_TABLET | ORAL | Status: DC
Start: 2019-11-05 — End: 2019-11-05

## 2019-11-05 MED ORDER — CARBOXYMETHYLCELLULOSE SODIUM 0.5 % OP SOLN
1.00 | OPHTHALMIC | Status: DC
Start: ? — End: 2019-11-05

## 2019-11-05 MED ORDER — POLYETHYLENE GLYCOL 3350 17 GM/SCOOP PO POWD
17.00 | ORAL | Status: DC
Start: ? — End: 2019-11-05

## 2019-11-05 MED ORDER — QUINTABS PO TABS
1.00 | ORAL_TABLET | ORAL | Status: DC
Start: 2019-11-14 — End: 2019-11-05

## 2019-11-05 MED ORDER — CALCIUM CARBONATE 1250 (500 CA) MG PO CHEW
CHEWABLE_TABLET | ORAL | Status: DC
Start: ? — End: 2019-11-05

## 2019-11-05 MED ORDER — SENNOSIDES-DOCUSATE SODIUM 8.6-50 MG PO TABS
2.00 | ORAL_TABLET | ORAL | Status: DC
Start: 2019-11-13 — End: 2019-11-05

## 2019-11-08 MED ORDER — WARFARIN SODIUM 2.5 MG PO TABS
2.50 | ORAL_TABLET | ORAL | Status: DC
Start: 2019-11-08 — End: 2019-11-08

## 2019-11-08 MED ORDER — LIDOCAINE 5 % EX PTCH
1.00 | MEDICATED_PATCH | CUTANEOUS | Status: DC
Start: 2019-11-13 — End: 2019-11-08

## 2019-11-08 MED ORDER — HEPARIN SOD (PORCINE) IN D5W 100 UNIT/ML IV SOLN
7.50 | INTRAVENOUS | Status: DC
Start: ? — End: 2019-11-08

## 2019-11-08 MED ORDER — HYOSCYAMINE SULFATE SL 0.125 MG SL SUBL
0.13 | SUBLINGUAL_TABLET | SUBLINGUAL | Status: DC
Start: 2019-11-13 — End: 2019-11-08

## 2019-11-08 MED ORDER — POLYETHYLENE GLYCOL 3350 17 GM/SCOOP PO POWD
17.00 | ORAL | Status: DC
Start: 2019-11-14 — End: 2019-11-08

## 2019-11-08 MED ORDER — GABAPENTIN 300 MG PO CAPS
300.00 | ORAL_CAPSULE | ORAL | Status: DC
Start: 2019-11-13 — End: 2019-11-08

## 2019-11-08 MED ORDER — GENERIC EXTERNAL MEDICATION
Status: DC
Start: ? — End: 2019-11-08

## 2019-11-08 MED ORDER — SUCRALFATE 1 G PO TABS
1.00 | ORAL_TABLET | ORAL | Status: DC
Start: 2019-11-08 — End: 2019-11-08

## 2019-11-08 MED ORDER — OXYBUTYNIN CHLORIDE 5 MG PO TABS
5.00 | ORAL_TABLET | ORAL | Status: DC
Start: 2019-11-14 — End: 2019-11-08

## 2019-11-08 MED ORDER — OXYCODONE HCL 5 MG PO TABS
5.00 | ORAL_TABLET | ORAL | Status: DC
Start: ? — End: 2019-11-08

## 2019-11-08 MED ORDER — ACETAMINOPHEN 325 MG PO TABS
975.00 | ORAL_TABLET | ORAL | Status: DC
Start: 2019-11-13 — End: 2019-11-08

## 2019-11-08 MED ORDER — MAGNESIUM HYDROXIDE 400 MG/5ML PO SUSP
15.00 | ORAL | Status: DC
Start: 2019-11-13 — End: 2019-11-08

## 2019-11-13 MED ORDER — POTASSIUM CHLORIDE CRYS ER 20 MEQ PO TBCR
40.00 | EXTENDED_RELEASE_TABLET | ORAL | Status: DC
Start: 2019-11-14 — End: 2019-11-13

## 2019-11-13 MED ORDER — NEOMYCIN-POLYMYXIN-HC 3.5-10000-1 OT SUSP
4.00 | OTIC | Status: DC
Start: 2019-11-13 — End: 2019-11-13

## 2019-11-13 MED ORDER — BENZOCAINE-MENTHOL 15-2.6 MG MT LOZG
1.00 | LOZENGE | OROMUCOSAL | Status: DC
Start: ? — End: 2019-11-13

## 2019-11-13 MED ORDER — WARFARIN SODIUM 3 MG PO TABS
3.00 | ORAL_TABLET | ORAL | Status: DC
Start: 2019-11-13 — End: 2019-11-13

## 2019-11-13 MED ORDER — CALCIUM CARBONATE 1250 (500 CA) MG PO CHEW
CHEWABLE_TABLET | ORAL | Status: DC
Start: ? — End: 2019-11-13

## 2019-11-16 ENCOUNTER — Other Ambulatory Visit: Payer: Self-pay

## 2019-11-16 NOTE — Progress Notes (Signed)
[facility]  [address] [place], [place] [address] Phone: [phone]  Fax: [phone]   Telemedicine Office Visit:  11/17/2019  Referring physician: Sharyne Peach, MD  I connected with Angela [name] on 11/17/19 at 12:16 PM by videoconferencing and verified that I was speaking with the correct person using 2 identifiers.  The patient was at home.  I discussed the limitations, risk, security and privacy concerns of performing an evaluation and management service by videoconferencing and the availability of in person appointments.  I also discussed with the patient that there may be a patient responsible charge related to this service.  The patient expressed understanding and agreed to proceed.   Chief Complaint: Angela [name] is a 44 y.o. female with recurrent pulmonary embolism who is seen for assessment after interval gastric bypass surgery.    HPI: The patient was last seen in the hematology clinic on 09/06/2019. At that time, she noted issues with her new medication Ocie Cornfield). She denied any bruising or bleeding on Coumadin. She noted plans for gastric sleeve surgery between 08/2019 - 10/2019. Hematocrit 38.2, hemoglobin 13.0, MCV 83.6, platelets 358,000, WBC 9000, ANC 5600. Ferritin 51. Iron 48, TIBC 302, Saturation Ratios 16.  INR was 2.0.   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 She tolerated the procedure well.  She was admitted to [facility] on 10/31/2019 for hypotension and tachycardia.  Hemoglobin was 9.5, WBC 20,200, lacate 5.3, and INR 4.0.  Abdomen and pelvic CT on 10/31/2019 revealed hemoperitoneum with contrast extravasation at the inferior epigastric artery.   She was seen by Dr. Lucky Cowboy in vascular surgery.  She underwent embolization of the left inferior epigastric artery. There was no extravasation or active bleeding during angiogram. Nadir hemoglobin was 7.9.  She received 2 units of pRBC and 4 liters  crystalloid.  She was transferred to [facility] on 11/01/2019.   She was admitted to [facility] from 11/01/2019 - 11/13/2019.  Repeat CT revealed retrogastric hematoma with moderate hemoperitoneum.  Chest CT was negative for pulmonary embolism. She was bridged from heparin to Coumadin. Hemoglobin was 11.1 and INR 2.1 on discharge. She was set up with home INR checks and follow-up with Dr Iona Beard.  She had a telemedicine visit with Dr Iona Beard on 11/15/2019.  During the interim, she has felt tired and sore. She says she was not very lucid when the decision was made for her to go to [facility]. She did not understand a lot until she personally spoke with Dr. Iona Beard two days ago. She is taking 3 mg of Coumadin daily. Dr. Iona Beard is going to check her INR again on Tuesday. She is on home health care. She is getting set up for her own monitor to use to check her INR. She denies any issues with bruising or bleeding. The biggest issue she has is being fatigued and sore.   She plans to have an in person appointment with Dr. Iona Beard when she feels a little better. Her shortness of breath is about the same. When she takes deep breaths she gets sore. She was constipated for three days up until yesterday. She took Miralax. She used the bathroom late last night. She is having a lot of trouble eating. A lot of foods make her nauseous. Protein shakes are extremely sweet. She feels like she can only tolerate water and unflavored protein shakes. She was given Zofran. She is taking vitamins. She has lost 29 pounds.    Past  Medical History:  Diagnosis Date  . Anemia   . Anxiety   . Asthma   . Depression   . Fibromyalgia   . GERD (gastroesophageal reflux disease)   . Hypertension   . Irritable bowel syndrome (IBS)   . Pseudotumor cerebri   . Pulmonary embolism ([place])   . Raynaud's disease   . Thyroid disease    hypothyroid  . Vision disturbance     Past Surgical History:  Procedure Laterality Date  .  ABDOMINAL HYSTERECTOMY  02/06/13  . BREAST BIOPSY Right October, 2014   right breast core biopsy, fibroadenomatous changes  . BREAST BIOPSY Right December 2014   FNA retroareolar nodule consistent with fibroadenoma.  Marland Kitchen BREAST BIOPSY Right 12/16/2016   Fibroadenoma in the retroareolar area at 6:00.  Marland Kitchen [place]  . COLONOSCOPY  2015   Dr Allen Norris  . COLONOSCOPY WITH PROPOFOL N/A 05/27/2019   Procedure: COLONOSCOPY WITH PROPOFOL;  Surgeon: Virgel Manifold, MD;  Location: [facility] ENDOSCOPY;  Service: Endoscopy;  Laterality: N/A;  . EMBOLIZATION N/A 10/31/2019   Procedure: EMBOLIZATION;  Surgeon: Algernon Huxley, MD;  Location: [place] CV LAB;  Service: Cardiovascular;  Laterality: N/A;  . ESOPHAGOGASTRODUODENOSCOPY (EGD) WITH PROPOFOL N/A 05/27/2019   Procedure: ESOPHAGOGASTRODUODENOSCOPY (EGD) WITH PROPOFOL;  Surgeon: Virgel Manifold, MD;  Location: [facility] ENDOSCOPY;  Service: Endoscopy;  Laterality: N/A;  . IVC FILTER INSERTION N/A 05/29/2019   Procedure: IVC FILTER INSERTION;  Surgeon: Algernon Huxley, MD;  Location: [place] CV LAB;  Service: Cardiovascular;  Laterality: N/A;  . RHINOPLASTY    . TONSILECTOMY, ADENOIDECTOMY, BILATERAL MYRINGOTOMY AND TUBES  2004  . UPPER GASTROINTESTINAL ENDOSCOPY  2015   Dr Allen Norris    Family History  Problem Relation Age of Onset  . Depression Mother   . Migraines Mother   . Diverticulitis Mother   . Hypertension Mother   . Heart disease Father   . Diabetes Father   . Hypertension Father   . Stroke Sister   . Cancer Maternal Aunt   . Breast cancer Maternal Aunt   . Cancer Maternal Grandmother     Social History:  reports that she has never smoked. She has never used smokeless tobacco. She reports current alcohol use. She reports that she does not use drugs. She drinks alcohol occasionally, in social settings. She has 3 children and 3 grandchildren.She lives in [place]. The patient is alone  today.  Participants  in the patient's visit and their role in the encounter included the patient, Heywood Footman, and [employer], CMA, today. The intake visit was provided by Vito Berger, CMA.    Allergies:  Allergies  Allergen Reactions  . Bee Venom Other (See Comments), Nausea Only, Rash and Shortness Of Breath  . Bupropion Other (See Comments)  . Amoxicillin Hives    Did it involve swelling of the face/tongue/throat, SOB, or low BP? No Did it involve sudden or severe rash/hives, skin peeling, or any reaction on the inside of your mouth or nose? No Did you need to seek medical attention at a hospital or doctor's office? Unknown When did it last happen?5+ years If all above answers are "NO", may proceed with cephalosporin use.   Marcelline Mates     Break out in mouth  . Lisinopril Cough  . Nortriptyline     Unknown reaction   . Orange Fruit [Citrus]     Break out in mouth  . Other     Sulfa eye  drops- Scratched corneas  . Penicillins Hives    Did it involve swelling of the face/tongue/throat, SOB, or low BP? No Did it involve sudden or severe rash/hives, skin peeling, or any reaction on the inside of your mouth or nose? No Did you need to seek medical attention at a hospital or doctor's office? Unknown When did it last happen?5+ years If all above answers are "NO", may proceed with cephalosporin use.   . Milnacipran Palpitations    Current Medications: Current Outpatient Medications  Medication Sig Dispense Refill  . acetaminophen (TYLENOL) 500 MG tablet Take 1,000-1,500 mg by mouth every 6 (six) hours as needed for moderate pain or headache.    . albuterol (PROVENTIL HFA;VENTOLIN HFA) 108 (90 BASE) MCG/ACT inhaler Inhale 2 puffs into the lungs every 6 (six) hours as needed for wheezing.    . Carboxymethylcellulose Sodium (REFRESH LIQUIGEL OP) Place 1 drop into both eyes daily as needed (dry eyes).    . Cholecalciferol 125 MCG (5000 UT) TABS Take by mouth.    . clindamycin  (CLEOCIN T) 1 % lotion Apply 1 application topically 2 (two) times daily as needed (acne).    . ergocalciferol (VITAMIN D2) 1.25 MG (50000 UT) capsule Take by mouth.    . fluticasone (FLONASE) 50 MCG/ACT nasal spray Place 1 spray into both nostrils daily as needed for rhinitis.    Marland Kitchen gabapentin (NEURONTIN) 300 MG capsule Take by mouth.    . Galcanezumab-gnlm 120 MG/ML SOAJ Inject 120 mg into the skin every 28 (twenty-eight) days.     . Hyoscyamine Sulfate SL 0.125 MG SUBL Place under the tongue.    Marland Kitchen levothyroxine (SYNTHROID) 150 MCG tablet Take 150 mcg by mouth daily before breakfast.     . linaclotide (LINZESS) 72 MCG capsule Take 72 mcg by mouth daily as needed (constipation).    Marland Kitchen loratadine (CLARITIN) 10 MG tablet Take 10 mg by mouth daily.    . Multiple Vitamin (MULTIVITAMIN WITH MINERALS) TABS tablet Take 1 tablet by mouth daily.    Marland Kitchen nystatin (MYCOSTATIN/NYSTOP) powder Apply topically.    . ondansetron (ZOFRAN) 8 MG tablet Take 8 mg by mouth every 8 (eight) hours as needed for nausea or vomiting.    . ondansetron (ZOFRAN-ODT) 4 MG disintegrating tablet Take by mouth.    . oxybutynin (DITROPAN) 5 MG tablet Take 5 mg by mouth at bedtime.    Marland Kitchen oxyCODONE (OXY IR/ROXICODONE) 5 MG immediate release tablet Take by mouth.    . pantoprazole (PROTONIX) 40 MG tablet Take 40 mg by mouth daily.     . polyethylene glycol (MIRALAX / GLYCOLAX) 17 g packet PLEASE SEE ATTACHED FOR DETAILED DIRECTIONS    . rosuvastatin (CRESTOR) 20 MG tablet Take 20 mg by mouth daily.    . sodium fluoride (DENTAGEL) 1.1 % GEL dental gel Place 1 application onto teeth at bedtime.     . topiramate (TOPAMAX) 100 MG tablet Take 100 mg by mouth 2 (two) times daily.    Marland Kitchen topiramate (TOPAMAX) 50 MG tablet Take 50 mg by mouth 2 (two) times daily.    Marland Kitchen warfarin (COUMADIN) 3 MG tablet Take by mouth.    . cyclobenzaprine (FLEXERIL) 10 MG tablet Take 1 tablet (10 mg total) by mouth as needed. (Patient not taking: Reported on 11/16/2019)  30 tablet 3  . Difluprednate (DUREZOL) 0.05 % EMUL Place 1 drop into both eyes every 6 (six) hours as needed (uveitis).     Marland Kitchen diphenhydrAMINE (BENADRYL) 25 mg capsule Take  25 mg by mouth 2 (two) times daily as needed for allergies or sleep.     Marland Kitchen enoxaparin (LOVENOX) 150 MG/ML injection Inject 0.9 mLs (135 mg total) into the skin every 12 (twelve) hours. (Patient not taking: Reported on 11/16/2019) 25 mL 0  . EPINEPHrine (EPIPEN 2-PAK) 0.3 mg/0.3 mL IJ SOAJ injection Inject 0.3 mg into the muscle as needed for anaphylaxis.     . Melatonin 3 MG CAPS Take 3 mg by mouth at bedtime as needed (sleep).    . prednisoLONE acetate (PRED FORTE) 1 % ophthalmic suspension Place 1 drop into both eyes every 4 (four) hours as needed (uveitis).     . promethazine (PHENERGAN) 25 MG tablet Take 25 mg by mouth every 6 (six) hours as needed for nausea or vomiting.    Marland Kitchen tiZANidine (ZANAFLEX) 4 MG tablet Take 4 mg by mouth every 12 (twelve) hours as needed for muscle spasms.    . traMADol (ULTRAM) 50 MG tablet Take 1 tablet (50 mg total) by mouth every 6 (six) hours as needed for moderate pain. (Patient not taking: Reported on 11/16/2019) 12 tablet 0   No current facility-administered medications for this visit.    Review of Systems  Constitutional: Positive for malaise/fatigue and weight loss (29 pounds s/p gastric bypass surgery). Negative for chills, diaphoresis and fever.       Feels "tired and sore".  HENT: Negative.  Negative for congestion, ear discharge, ear pain, hearing loss, nosebleeds, sinus pain and sore throat.   Eyes: Negative.  Negative for blurred vision and double vision.  Respiratory: Positive for shortness of breath (chronic). Negative for cough, hemoptysis and sputum production.   Cardiovascular: Negative for chest pain, palpitations, leg swelling and PND.  Gastrointestinal: Positive for abdominal pain, constipation (taking Miralax) and nausea. Negative for blood in stool, diarrhea, melena and  vomiting.       Irritable bowel syndrome (IBS).  S/p gastric bypass surgery.  Genitourinary: Negative.  Negative for dysuria, hematuria and urgency.  Musculoskeletal: Positive for back pain. Negative for joint pain and myalgias.       Fibromylagia.  Skin: Negative.  Negative for rash.  Neurological: Positive for headaches. Negative for dizziness, tingling, sensory change, speech change, focal weakness, seizures and weakness.  Endo/Heme/Allergies: Bruises/bleeds easily.  Psychiatric/Behavioral: Negative for memory loss. The patient has insomnia. The patient is not nervous/anxious.   All other systems reviewed and are negative.  Performance status (ECOG):  1  Vitals There were no vitals taken for this visit.   Physical Exam  Constitutional: She is oriented to person, place, and time. She appears well-developed and well-nourished. No distress.  HENT:  Head: Normocephalic and atraumatic.  Brown hair.  Eyes: Conjunctivae and EOM are normal. No scleral icterus.  Glasses.  Brown eyes.  Neurological: She is alert and oriented to person, place, and time.  Skin: She is not diaphoretic.  Psychiatric: She has a normal mood and affect. Her behavior is normal. Judgment and thought content normal.  Nursing note and vitals reviewed.   No visits with results within 3 Day(s) from this visit.  Latest known visit with results is:  Admission on 10/31/2019, Discharged on 11/01/2019  Component Date Value Ref Range Status  . WBC 10/31/2019 20.2* 4.0 - 10.5 K/uL Final  . RBC 10/31/2019 3.34* 3.87 - 5.11 MIL/uL Final  . Hemoglobin 10/31/2019 9.5* 12.0 - 15.0 g/dL Final  . HCT 10/31/2019 29.3* 36.0 - 46.0 % Final  . MCV 10/31/2019 87.7  80.0 -  100.0 fL Final  . MCH 10/31/2019 28.4  26.0 - 34.0 pg Final  . MCHC 10/31/2019 32.4  30.0 - 36.0 g/dL Final  . RDW 10/31/2019 14.0  11.5 - 15.5 % Final  . Platelets 10/31/2019 323  150 - 400 K/uL Final  . nRBC 10/31/2019 0.0  0.0 - 0.2 % Final  . Neutrophils  Relative % 10/31/2019 74  % Final  . Neutro Abs 10/31/2019 15.0* 1.7 - 7.7 K/uL Final  . Lymphocytes Relative 10/31/2019 19  % Final  . Lymphs Abs 10/31/2019 3.9  0.7 - 4.0 K/uL Final  . Monocytes Relative 10/31/2019 6  % Final  . Monocytes Absolute 10/31/2019 1.1* 0.1 - 1.0 K/uL Final  . Eosinophils Relative 10/31/2019 0  % Final  . Eosinophils Absolute 10/31/2019 0.0  0.0 - 0.5 K/uL Final  . Basophils Relative 10/31/2019 0  % Final  . Basophils Absolute 10/31/2019 0.1  0.0 - 0.1 K/uL Final  . Immature Granulocytes 10/31/2019 1  % Final  . Abs Immature Granulocytes 10/31/2019 0.13* 0.00 - 0.07 K/uL Final   Performed at [facility], [address], [place], [place] [address]  . Magnesium 10/31/2019 2.2  1.7 - 2.4 mg/dL Final   Performed at [facility], [address] [place], [place] [address]  . Sodium 10/31/2019 138  135 - 145 mmol/L Final  . Potassium 10/31/2019 4.3  3.5 - 5.1 mmol/L Final  . Chloride 10/31/2019 108  98 - 111 mmol/L Final  . CO2 10/31/2019 14* 22 - 32 mmol/L Final  . Glucose, Bld 10/31/2019 308* 70 - 99 mg/dL Final   Glucose reference range applies only to samples taken after fasting for at least 8 hours.  . BUN 10/31/2019 20  6 - 20 mg/dL Final  . Creatinine, Ser 10/31/2019 1.84* 0.44 - 1.00 mg/dL Final  . Calcium 10/31/2019 8.4* 8.9 - 10.3 mg/dL Final  . Total Protein 10/31/2019 6.3* 6.5 - 8.1 g/dL Final  . Albumin 10/31/2019 3.3* 3.5 - 5.0 g/dL Final  . AST 10/31/2019 17  15 - 41 U/L Final  . ALT 10/31/2019 15  0 - 44 U/L Final  . Alkaline Phosphatase 10/31/2019 35* 38 - 126 U/L Final  . Total Bilirubin 10/31/2019 0.7  0.3 - 1.2 mg/dL Final  . GFR calc non Af Amer 10/31/2019 33* >60 mL/min Final  . GFR calc Af Amer 10/31/2019 38* >60 mL/min Final  . Anion gap 10/31/2019 16* 5 - 15 Final   Performed at [facility], [address], [place], [place] [address]  . Glucose-Capillary 10/31/2019 270* 70 - 99 mg/dL Final   Glucose  reference range applies only to samples taken after fasting for at least 8 hours.  . Lactic Acid, Venous 10/31/2019 <0.3* 0.5 - 1.9 mmol/L Final   Performed at [facility], [address] [place], [place] [address]  . Lactic Acid, Venous 10/31/2019 5.3* 0.5 - 1.9 mmol/L Final   Comment: CRITICAL RESULT CALLED TO, READ BACK BY AND VERIFIED WITH  PAIGE JOHNSON AT 0725 10/31/19 SDR Performed at [facility], [address] [place],  [address]   . Procalcitonin 10/31/2019 0.12  ng/mL Final   Comment:        Interpretation: PCT (Procalcitonin) <= 0.5 ng/mL: Systemic infection (sepsis) is not likely. Local bacterial infection is possible. (NOTE)       Sepsis PCT Algorithm           Lower Respiratory Tract  Infection PCT Algorithm    ----------------------------     ----------------------------         PCT < 0.25 ng/mL                PCT < 0.10 ng/mL         Strongly encourage             Strongly discourage   discontinuation of antibiotics    initiation of antibiotics    ----------------------------     -----------------------------       PCT 0.25 - 0.50 ng/mL            PCT 0.10 - 0.25 ng/mL               OR       >80% decrease in PCT            Discourage initiation of                                            antibiotics      Encourage discontinuation           of antibiotics    ----------------------------     -----------------------------         PCT >= 0.50 ng/mL              PCT 0.26 - 0.50 ng/mL               AND                                 <80% decrease in PCT             Encourage initiation of                                             antibiotics       Encourage continuation           of antibiotics    ----------------------------     -----------------------------        PCT >= 0.50 ng/mL                  PCT > 0.50 ng/mL               AND         increase in PCT                  Strongly encourage                                       initiation of antibiotics    Strongly encourage escalation           of antibiotics                                     -----------------------------  PCT <= 0.25 ng/mL                                                 OR                                        > 80% decrease in PCT                                     Discontinue / Do not initiate                                             antibiotics Performed at [facility], [address] [place], [place] [address]   . B Natriuretic Peptide 10/31/2019 34.0  0.0 - 100.0 pg/mL Final   Performed at [facility], [address] [place], [place] [address]  . Prothrombin Time 10/31/2019 39.4* 11.4 - 15.2 seconds Final  . INR 10/31/2019 4.0* 0.8 - 1.2 Final   Comment: CRITICAL RESULT CALLED TO, READ BACK BY AND VERIFIED WITH: [email or web address]  on 10/31/19 by hkp (NOTE) INR goal varies based on device and disease states. Performed at [facility], [address] [place], [place] [address]   . Specimen Description 10/31/2019 BLOOD RIGHT AC   Final  . Special Requests 10/31/2019 BOTTLES DRAWN AEROBIC AND ANAEROBIC Blood Culture adequate volume   Final  . Culture 10/31/2019    Final                   Value:NO GROWTH 5 DAYS Performed at [facility], [address] [place], [place] [address]   . Report Status 10/31/2019 11/05/2019 FINAL   Final  . Specimen Description 10/31/2019 BLOOD RIGHT HAND   Final  . Special Requests 10/31/2019 BOTTLES DRAWN AEROBIC AND ANAEROBIC Blood Culture results may not be optimal due to an inadequate volume of blood received in culture bottles   Final  . Culture 10/31/2019    Final                   Value:NO GROWTH 5 DAYS Performed at [facility], [address], [place], [place] [address]   . Report Status 10/31/2019 11/05/2019 FINAL   Final  . Lipase 10/31/2019 54* 11 - 51 U/L  Final   Performed at [facility] [address] [place], [place] [address]  . SARS Coronavirus 2 by RT PCR 10/31/2019 NEGATIVE  NEGATIVE Final   Comment: (NOTE) SARS-CoV-2 target nucleic acids are NOT DETECTED. The SARS-CoV-2 RNA is generally detectable in upper respiratoy specimens during the acute phase of infection. The lowest concentration of SARS-CoV-2 viral copies this assay can detect is 131 copies/mL. A negative result does not preclude SARS-Cov-2 infection and should not be used as the sole basis for treatment or other patient management decisions. A negative result may occur with  improper specimen collection/handling, submission of specimen other than nasopharyngeal swab, presence of viral mutation(s) within the areas targeted by this assay, and inadequate number of viral copies (<131 copies/mL).  A negative result must be combined with clinical observations, patient history, and epidemiological information. The expected result is Negative. Fact Sheet for Patients:  PinkCheek.be Fact Sheet for Healthcare Providers:  GravelBags.it This test is not yet ap                          proved or cleared by the [place] FDA and  has been authorized for detection and/or diagnosis of SARS-CoV-2 by FDA under an Emergency Use Authorization (EUA). This EUA will remain  in effect (meaning this test can be used) for the duration of the COVID-19 declaration under Section 564(b)(1) of the Act, 21 U.S.C. section 360bbb-3(b)(1), unless the authorization is terminated or revoked sooner.   . Influenza A by PCR 10/31/2019 NEGATIVE  NEGATIVE Final  . Influenza B by PCR 10/31/2019 NEGATIVE  NEGATIVE Final   Comment: (NOTE) The Xpert Xpress SARS-CoV-2/FLU/RSV assay is intended as an aid in  the diagnosis of influenza from Nasopharyngeal swab specimens and  should not be used as a sole basis for treatment. Nasal washings and   aspirates are unacceptable for Xpert Xpress SARS-CoV-2/FLU/RSV  testing. Fact Sheet for Patients: PinkCheek.be Fact Sheet for Healthcare Providers: GravelBags.it This test is not yet approved or cleared by the [place] FDA and  has been authorized for detection and/or diagnosis of SARS-CoV-2 by  FDA under an Emergency Use Authorization (EUA). This EUA will remain  in effect (meaning this test can be used) for the duration of the  Covid-19 declaration under Section 564(b)(1) of the Act, 21  U.S.C. section 360bbb-3(b)(1), unless the authorization is  terminated or revoked. Performed at [facility], [address] [place], [place] [address]   . ABO/RH(D) 10/31/2019 O POS   Final  . Antibody Screen 10/31/2019 NEG   Final  . Sample Expiration 10/31/2019 11/03/2019,2359   Final  . Unit Number 10/31/2019 [id number]   Final  . Blood Component Type 10/31/2019 RED CELLS,LR   Final  . Unit division 10/31/2019 00   Final  . Status of Unit 10/31/2019 REL FROM [facility]   Final  . Transfusion Status 10/31/2019 OK TO TRANSFUSE   Final  . Crossmatch Result 10/31/2019 COMPATIBLE   Final  . Unit tag comment 10/31/2019 EMERGENCY RELEASE   Final  . Unit Number 10/31/2019 [id number]   Final  . Blood Component Type 10/31/2019 RED CELLS,LR   Final  . Unit division 10/31/2019 00   Final  . Status of Unit 10/31/2019 ISSUED,FINAL   Final  . Transfusion Status 10/31/2019 OK TO TRANSFUSE   Final  . Crossmatch Result 10/31/2019 COMPATIBLE   Final  . Unit tag comment 10/31/2019 EMERGENCY RELEASE   Final  . Unit Number 10/31/2019 [id number]   Final  . Blood Component Type 10/31/2019 RED CELLS,LR   Final  . Unit division 10/31/2019 00   Final  . Status of Unit 10/31/2019 ISSUED,FINAL   Final  . Transfusion Status 10/31/2019 OK TO TRANSFUSE   Final  . Crossmatch Result 10/31/2019 Compatible   Final  . Order Confirmation 10/31/2019     Final                   Value:ORDER PROCESSED BY BLOOD BANK Performed at [facility], [address] [place], [place] [address]   . ISSUE DATE / TIME 10/31/2019 [id number]   Final  . Blood Product Unit Number 10/31/2019 [id number]   Final  . PRODUCT CODE 10/31/2019 [id number]  Final  . Unit Type and Rh 10/31/2019 5100   Final  . Blood Product Expiration Date 10/31/2019 [id number]   Final  . ISSUE DATE / TIME 10/31/2019 [id number]   Final  . Blood Product Unit Number 10/31/2019 [id number]   Final  . Unit Type and Rh 10/31/2019 9500   Final  . Blood Product Expiration Date 10/31/2019 [id number]   Final  . ISSUE DATE / TIME 10/31/2019 [id number]   Final  . Blood Product Unit Number 10/31/2019 [id number]   Final  . PRODUCT CODE 10/31/2019 [id number]   Final  . Unit Type and Rh 10/31/2019 [id number]   Final  . Blood Product Expiration Date 10/31/2019 [id number]   Final  . Lactic Acid, Venous 10/31/2019 2.0* 0.5 - 1.9 mmol/L Final   Comment: CRITICAL VALUE NOTED. VALUE IS CONSISTENT WITH PREVIOUSLY REPORTED/CALLED VALUE SNG Performed at [facility], [address] [place], [place] [address]   . WBC 10/31/2019 19.1* 4.0 - 10.5 K/uL Final  . RBC 10/31/2019 2.95* 3.87 - 5.11 MIL/uL Final  . Hemoglobin 10/31/2019 8.6* 12.0 - 15.0 g/dL Final  . HCT 10/31/2019 25.8* 36.0 - 46.0 % Final  . MCV 10/31/2019 87.5  80.0 - 100.0 fL Final  . MCH 10/31/2019 29.2  26.0 - 34.0 pg Final  . MCHC 10/31/2019 33.3  30.0 - 36.0 g/dL Final  . RDW 10/31/2019 14.0  11.5 - 15.5 % Final  . Platelets 10/31/2019 257  150 - 400 K/uL Final  . nRBC 10/31/2019 0.0  0.0 - 0.2 % Final   Performed at [facility], [address] [place], [place] [address]  . Sodium 10/31/2019 139  135 - 145 mmol/L Final  . Potassium 10/31/2019 4.2  3.5 - 5.1 mmol/L Final  . Chloride 10/31/2019 112* 98 - 111 mmol/L Final  . CO2 10/31/2019 18* 22 - 32 mmol/L Final  . Glucose, Bld 10/31/2019  157* 70 - 99 mg/dL Final   Glucose reference range applies only to samples taken after fasting for at least 8 hours.  . BUN 10/31/2019 21* 6 - 20 mg/dL Final  . Creatinine, Ser 10/31/2019 1.50* 0.44 - 1.00 mg/dL Final  . Calcium 10/31/2019 7.6* 8.9 - 10.3 mg/dL Final  . GFR calc non Af Amer 10/31/2019 42* >60 mL/min Final  . GFR calc Af Amer 10/31/2019 49* >60 mL/min Final  . Anion gap 10/31/2019 9  5 - 15 Final   Performed at [facility], [address], [place], [place] [address]  . aPTT 10/31/2019 54* 24 - 36 seconds Final   Comment:        IF BASELINE aPTT IS ELEVATED, SUGGEST PATIENT RISK ASSESSMENT BE USED TO DETERMINE APPROPRIATE ANTICOAGULANT THERAPY. Performed at [facility] [place], [address] [place], [place] [address]   . Prothrombin Time 10/31/2019 18.0* 11.4 - 15.2 seconds Final  . INR 10/31/2019 1.5* 0.8 - 1.2 Final   Comment: (NOTE) INR goal varies based on device and disease states. Performed at [facility], [address], [place], [place] [address]   . Lactic Acid, Venous 10/31/2019 1.6  0.5 - 1.9 mmol/L Final   Performed at [facility], [address] [place], [place] [address]  . Glucose-Capillary 10/31/2019 151* 70 - 99 mg/dL Final   Glucose reference range applies only to samples taken after fasting for at least 8 hours.  Marland Kitchen MRSA by PCR 10/31/2019 NEGATIVE  NEGATIVE Final   Comment:        The GeneXpert  MRSA Assay (FDA approved for NASAL specimens only), is one component of a comprehensive MRSA colonization surveillance program. It is not intended to diagnose MRSA infection nor to guide or monitor treatment for MRSA infections. Performed at [facility], [address], [place], [place] [address]   . Prothrombin Time 10/31/2019 19.4* 11.4 - 15.2 seconds Final  . INR 10/31/2019 1.7* 0.8 - 1.2 Final   Comment: (NOTE) INR goal varies based on device and disease states. Performed at [facility], [address], [place], [place] [address]   . WBC 10/31/2019 19.2* 4.0 - 10.5 K/uL Final  . RBC 10/31/2019 3.35* 3.87 - 5.11 MIL/uL Final  . Hemoglobin 10/31/2019 9.9* 12.0 - 15.0 g/dL Final  . HCT 10/31/2019 29.5* 36.0 - 46.0 % Final  . MCV 10/31/2019 88.1  80.0 - 100.0 fL Final  . MCH 10/31/2019 29.6  26.0 - 34.0 pg Final  . MCHC 10/31/2019 33.6  30.0 - 36.0 g/dL Final  . RDW 10/31/2019 13.9  11.5 - 15.5 % Final  . Platelets 10/31/2019 237  150 - 400 K/uL Final  . nRBC 10/31/2019 0.0  0.0 - 0.2 % Final   Performed at [facility], [address], [place], [place] [address]  . Sodium 10/31/2019 142  135 - 145 mmol/L Final  . Potassium 10/31/2019 4.3  3.5 - 5.1 mmol/L Final  . Chloride 10/31/2019 113* 98 - 111 mmol/L Final  . CO2 10/31/2019 20* 22 - 32 mmol/L Final  . Glucose, Bld 10/31/2019 151* 70 - 99 mg/dL Final   Glucose reference range applies only to samples taken after fasting for at least 8 hours.  . BUN 10/31/2019 24* 6 - 20 mg/dL Final  . Creatinine, Ser 10/31/2019 1.63* 0.44 - 1.00 mg/dL Final  . Calcium 10/31/2019 7.7* 8.9 - 10.3 mg/dL Final  . GFR calc non Af Amer 10/31/2019 38* >60 mL/min Final  . GFR calc Af Amer 10/31/2019 44* >60 mL/min Final  . Anion gap 10/31/2019 9  5 - 15 Final   Performed at [facility], [address] [place],  [address]  . Glucose-Capillary 10/31/2019 141* 70 - 99 mg/dL Final   Glucose reference range applies only to samples taken after fasting for at least 8 hours.  . Glucose-Capillary 10/31/2019 121* 70 - 99 mg/dL Final   Glucose reference range applies only to samples taken after fasting for at least 8 hours.    Assessment:  Angela [name] is a 44 y.o. 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 She is on Coumadin.  Work-up on 12/22/2018 included the following normal/negative labs: CBC with diff, lupus anticoagulant, protein C activity, and protein S  activity/antigen.Normal labson 01/06/2019 included: Factor V Leiden, prothrombin gene mutation, anticardiolipin antibodies, and beta2-glycoprotein antibodies.  Chest CT angiogramon 12/20/2018 revealed a wedge-shaped opacity in the right lower lobelateral basal segment consistent with a pulmonary infarct. There was relative hypoenhancement within the associated segmental artery probablysecondary toa small pulmonary embolus, though it wasdifficult to assessgiven limitationsof beam attenuation caused by the patient's body habitus. There was a small right pleural effusion with associated atelectasis.  Bilateral lower extremity duplexon 12/21/2018 revealed no femoropopliteal and no calf DVT in the visualized calf veins.  AbdomenandpelvisCTon 01/16/2019 revealed no acute findings. There was a new 2 cm pleural-based nodule in posterior right lower lobe.  PET scanon 01/26/2019 revealed no hypermetabolic findings highly suspicious for a malignant process. The pleural-based right lung base pulmonary nodule demonstrated no significant metabolism, was  slightly decreased from 01/16/2019 CT abdomen study, and was substantially decreased from 12/20/2018 chest CT angiogram study, most compatible with a resolving pulmonary infarct.  [facility] and a lower GI bleed. CTA abdomen and pelvison 05/26/2019 showed scattered diverticulosis, large stool burden. Tagged RBCscanon 05/26/2019 showed no evidence of GI bleed. IVC filterwas placedon 05/29/2019. EGDand colonoscopyon 05/27/2019 showed no evidence of active or recent bleeding. The source of patient's bleeding was likely internal hemorrhoids.   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  She underwent embolization of the left inferior epigastric artery.   Bilateral mammogram and left breast ultrasound on 06/26/2019  revealed a probably benign, 12 mm fibrocystic lesion at the 9:30 o'clock position, 7 cm the nipple.  Ultrasound revealed a cystic and solid lesion with echogenic foci consistent with calcifications. The appearance was consistent with a fibrocystic lesion. Short-term follow-up (diagnostic left mammogram and ultrasound) was recommended in 6 months.    Symptomatically, she feels tired.  She denies any bleeding.  INR is being monitored by Dr Iona Beard.  Plan: 1.   Review interim labs from [facility].  2.   Recurrent pulmonary embolism Patientwithpulmonary embolism in 2006and2020. Work-upwasnegative. PET scan revealed no malignancy. She has an IVC filter in place. She is currently taking Coumadin 3 mg/day.             INR is being monitored by Dr. Iona Beard. GoalINR is 2-3.  No current plan for Xarelto or Eliquis secondary to weight. 3.  S/p gastric sleeve surgery  Review interval surgery and complication of hemoperitoneum.  Discuss plan to follow-up on ferritin and B12 as she will likely become deficient. 4.Mammogram changes             Bilateral mammogram and left breast ultrasound on 06/26/2019 revealed likely cystic lesion (benign).             Follow-up diagnostic left mammogram and ultrasound on 12/26/2019. 5.RTC in 6 weeks for MD assessment and labs (CBC with diff, ferritin, iron studies, B12, folate).  I discussed the assessment and treatment plan with the patient.  The patient was provided an opportunity to ask questions and all were answered.  The patient agreed with the plan and demonstrated an understanding of the instructions.  The patient was advised to call back if the symptoms worsen or if the condition fails to improve as anticipated.  I provided 16 minutes (12:16 PM - 12:32 PM) of non-face-to-face time during this encounter.  I provided these services from the [facility] office.  An additional 15 minutes were  spent reviewing her chart (Epic and Care Everywhere) including notes, labs, and imaging studies.  I spoke to the care team at [facility] regarding her hospitalization.   Lequita Asal, MD, PhD    11/17/2019, 12:16 PM   I, Heywood Footman, am acting as a Education administrator for Lequita Asal, MD.  I, [place] Mike Gip, MD, have reviewed the above documentation for accuracy and completeness, and I agree with the above.

## 2019-11-16 NOTE — Progress Notes (Signed)
Confirmed patient name and DOB for phone assessment. Patient had surgery on 4/15 and 4/23. Patient states that she has not had a bowel movement in 3 days but has taken miralax today and purchased stool softeners. She is taking 3 mg of coumadin daily. She has questions about which multivitamin to take regarding her sensitivity to vitamin K and keeping her INR level. She also want to know about taking vitamin d supplement since she is taking it in multivitamin and it is in her calcium supplement. She is trying to get into bladder therapy. She is having SOB and using her inhaler. She is sore in her ribs and shoulders which her PCP told her this is normal right now from where her bleed was. She has no appetite but is using protein supplement

## 2019-11-17 ENCOUNTER — Encounter: Payer: Self-pay | Admitting: Hematology and Oncology

## 2019-11-17 ENCOUNTER — Inpatient Hospital Stay: Payer: Medicare Other | Attending: Hematology and Oncology | Admitting: Hematology and Oncology

## 2019-11-17 DIAGNOSIS — Z903 Acquired absence of stomach [part of]: Secondary | ICD-10-CM | POA: Diagnosis not present

## 2019-11-17 DIAGNOSIS — K661 Hemoperitoneum: Secondary | ICD-10-CM | POA: Diagnosis not present

## 2019-11-17 DIAGNOSIS — I2699 Other pulmonary embolism without acute cor pulmonale: Secondary | ICD-10-CM | POA: Diagnosis not present

## 2019-12-05 ENCOUNTER — Telehealth: Payer: Self-pay | Admitting: *Deleted

## 2019-12-05 NOTE — Telephone Encounter (Signed)
Patient called reporting that she is having pain in her leg that she had a procedure to remove a clot from. It started in her thigh and has now gone into her lower leg. She called her PCP yesterday about this and was told her to go to ER, but she reports that she has transportation issues and does not want the expense or wait time of the ER when all she needs is a scan done. She is asking if we could order a scan for her to see if she has a DVT. Please advise

## 2019-12-05 NOTE — Telephone Encounter (Signed)
  Please call patient.  We can order a scan or she can be seen in the symptom management clinic.  Lequita Asal, MD

## 2019-12-06 ENCOUNTER — Telehealth: Payer: Self-pay

## 2019-12-06 ENCOUNTER — Other Ambulatory Visit: Payer: Self-pay | Admitting: Oncology

## 2019-12-06 ENCOUNTER — Ambulatory Visit: Payer: Medicare Other

## 2019-12-06 ENCOUNTER — Encounter: Payer: Self-pay | Admitting: Oncology

## 2019-12-06 DIAGNOSIS — I2699 Other pulmonary embolism without acute cor pulmonale: Secondary | ICD-10-CM

## 2019-12-06 NOTE — Telephone Encounter (Signed)
Soonest available ultrasound of the left leg was at 4:30 PM today, even with a stat order.  Call report requested to my cell phone. Patient to be held until we speak to her.   Faythe Casa, NP 12/06/2019 1:06 PM

## 2019-12-06 NOTE — Telephone Encounter (Signed)
Spoke with the patient to inform her that we her to be seen in [facility]. She states maybe she can get the last appointment for the day. I have inform Anderson Malta, NP and she was able to reach out to her by phone today. Dr Iona Beard office called and want to know if the patient can be changed from Coumadin, the was given a PT& INR home monitor from [facility] after her surgery and Dr Iona Beard office don't feel comfortable monitoring the patient from home. The was to go to [facility] today but now she dont have a ride to get there. I told the patient to go the ER and she states she don't have a ride there either and i don't know what else to tell this patient, she was on the phone over 20 min. telling me she don't have a ride and that she have her grandkids and can't go to the ER. The has been non- complaints with any suggestion the I have given her today. Home health called at 1:40 and wanted to know were we the ones following this patient PT & INR. I have informed her that Dr Iona Beard office is handle her PT & INR and she will need to call her results to them today. She also reports there is 3 grown adults there that could have transported

## 2019-12-06 NOTE — Telephone Encounter (Signed)
PCP office called back to see if the patient can take Elqiuis or Xarelto, Per dr Mike Gip due to the patient weight she can no take this medication. They have tried to get the patient in the Coumadin clinic and they states she was non complaints and they will not see here.

## 2019-12-06 NOTE — Progress Notes (Signed)
Re: Pain in left leg  Patient called clinic yesterday afternoon complaining of left leg pain that extends from left thigh to her ankle.  She was advised to go to the emergency room per her PCP but unfortunately had transportation issues.  She also was not interested in long wait times and the expense of the emergency room.  She spoke to Dr. Kem Parkinson team yesterday and was advised to be evaluated in symptom management today with imaging.  She has history of recurrent PE and is currently on Coumadin.  She recently had a gastric sleeve and required a Lovenox bridge.  A few days after surgery she was admitted to [facility] for abdominal pain and was diagnosed with hemoperitoneum.  Imaging showed an area of contrast extravasation in the left abdominal wall at the inferior epigastric region.  Her lab work revealed an INR of 4, hemoglobin of 9.5 and creatinine 1.84.  Vascular surgery was consulted and she was taken for embolization of left inferior epigastric artery.  She was given 2 units of packed red blood cells, Kcentra and vitamin K.  She was then transferred to [facility] for further management.  She was admitted to [facility] from 11/01/2019-11/13/2019.  Repeat CT revealed retrogastric hematoma with moderate hemoperitoneum.  CT chest was negative for PE.  She was bridged from heparin to Coumadin.  Hemoglobin was 11.1 and INR 2.1 at discharge.  She was set up with home INR checks and follow-up with Dr. Iona Beard.  Given her complicated history and changes with her anticoagulants, orders placed for left lower extremity ultrasound for further evaluation.  We will get imaging scheduled and patient will be notified.  Faythe Casa, NP 12/06/2019 10:35 AM

## 2019-12-07 ENCOUNTER — Telehealth: Payer: Self-pay | Admitting: Oncology

## 2019-12-07 ENCOUNTER — Other Ambulatory Visit: Payer: Self-pay

## 2019-12-07 ENCOUNTER — Ambulatory Visit
Admission: RE | Admit: 2019-12-07 | Discharge: 2019-12-07 | Disposition: A | Discharge status: Home / Self Care | Payer: Medicare Other | Source: Ambulatory Visit | Attending: Oncology | Admitting: Oncology

## 2019-12-07 DIAGNOSIS — M25511 Pain in right shoulder: Secondary | ICD-10-CM | POA: Insufficient documentation

## 2019-12-07 DIAGNOSIS — I2699 Other pulmonary embolism without acute cor pulmonale: Secondary | ICD-10-CM | POA: Diagnosis not present

## 2019-12-07 DIAGNOSIS — M25512 Pain in left shoulder: Secondary | ICD-10-CM | POA: Diagnosis present

## 2019-12-07 DIAGNOSIS — G8929 Other chronic pain: Secondary | ICD-10-CM

## 2019-12-07 DIAGNOSIS — M159 Polyosteoarthritis, unspecified: Secondary | ICD-10-CM

## 2019-12-07 DIAGNOSIS — M8949 Other hypertrophic osteoarthropathy, multiple sites: Secondary | ICD-10-CM | POA: Insufficient documentation

## 2019-12-07 NOTE — Progress Notes (Signed)
Here are the results from her ultrasound. I have contacted her and let her know. I am trying to still get her in to be evaluated but she has transportation issues. FYI.   Faythe Casa, NP 12/07/2019 8:51 AM

## 2019-12-07 NOTE — Telephone Encounter (Signed)
Re: Results from ultrasound- left leg   Spoke to patient and notified her of ultrasound results.   Patient expressed concern about her Coumadin and frequent lab checks.  She is currently being monitored by her PCP.  She is apparently having transportation problems and has been unable to have her INR checked routinely.  Spoke with Dr. Mike Gip who recommends repeat INR ASAP given she is subtherapeutic.  Patient is to follow-up with her PCP as previously discussed.  Faythe Casa, NP 12/25/2019 3:32 PM

## 2019-12-19 ENCOUNTER — Encounter (INDEPENDENT_AMBULATORY_CARE_PROVIDER_SITE_OTHER): Payer: Self-pay

## 2019-12-20 NOTE — Telephone Encounter (Signed)
This is probably something she needs to come in and discuss in person with Dr. Delana Meyer

## 2019-12-20 NOTE — Telephone Encounter (Signed)
Please advise patient.  

## 2019-12-24 ENCOUNTER — Encounter: Payer: Self-pay | Admitting: Hematology and Oncology

## 2019-12-26 ENCOUNTER — Ambulatory Visit
Admission: RE | Admit: 2019-12-26 | Discharge: 2019-12-26 | Disposition: A | Discharge status: Home / Self Care | Payer: Medicare Other | Source: Ambulatory Visit | Attending: Hematology and Oncology | Admitting: Hematology and Oncology

## 2019-12-26 DIAGNOSIS — N6012 Diffuse cystic mastopathy of left breast: Secondary | ICD-10-CM | POA: Insufficient documentation

## 2019-12-27 ENCOUNTER — Other Ambulatory Visit: Payer: Self-pay | Admitting: Hematology and Oncology

## 2019-12-27 DIAGNOSIS — N6320 Unspecified lump in the left breast, unspecified quadrant: Secondary | ICD-10-CM

## 2019-12-27 DIAGNOSIS — R928 Other abnormal and inconclusive findings on diagnostic imaging of breast: Secondary | ICD-10-CM

## 2019-12-28 ENCOUNTER — Inpatient Hospital Stay: Payer: Medicare Other | Admitting: Hematology and Oncology

## 2019-12-28 ENCOUNTER — Inpatient Hospital Stay: Payer: Medicare Other

## 2020-01-01 ENCOUNTER — Inpatient Hospital Stay: Payer: Medicare Other

## 2020-01-01 ENCOUNTER — Inpatient Hospital Stay: Payer: Medicare Other | Admitting: Hematology and Oncology

## 2020-01-01 DIAGNOSIS — Z9884 Bariatric surgery status: Secondary | ICD-10-CM | POA: Insufficient documentation

## 2020-01-01 NOTE — Progress Notes (Deleted)
[facility]  [address], [address] [place], [place] [address] Phone: [phone]  Fax: [phone]   Clinic Day:  01/01/2020  Referring physician: Sharyne Peach, MD  Chief Complaint: Angela [name] is a 44 y.o. female s/p gastric bypass surgery and a history of recurrent pulmonary embolism who is seen for 6 week assessment.   HPI: The patient was last seen in the hematology clinic on 11/17/2019 for a telemedicine visit. At that time, she felt tired and sore after her laparoscopic sleeve gastrectomy ([id number]) complicated by hemoperitoneum.  She underwent embolization of the left inferior epigastric artery.  Her INR was being followed closely by Dr Iona Beard.  She had pain in her left leg on 12/05/2019. Left leg pain extended from left thigh to her ankle. Patient was advised to go to the emergency room. Patient preferred to be seen in the symptom management clinic. Left lower extremity venous doppler ultrasound on 12/07/2019 revealed no evidence of left lower extremity deep venous thrombosis.  Left unilateral diagnostic mammogram and ultrasound on 12/26/2019 revealed indeterminate 1.3 cm x 1 x 1.2 cm mixed cystic and solid mass in the left breast at the 9:30 position 10 cm from the nipple.   She underwent ultrasound-guided biopsy on 01/03/2020.  Pathology revealed fibrocystic changes with papillary apocrine metaplasia.  There was no atypia or malignancy.  During the interim, ***   Past Medical History:  Diagnosis Date  . Anemia   . Anxiety   . Asthma   . Depression   . Fibromyalgia   . GERD (gastroesophageal reflux disease)   . Hypertension   . Irritable bowel syndrome (IBS)   . Pseudotumor cerebri   . Pulmonary embolism ([place])   . Raynaud's disease   . Thyroid disease    hypothyroid  . Vision disturbance     Past Surgical History:  Procedure Laterality Date  . ABDOMINAL HYSTERECTOMY  02/06/13  . BREAST BIOPSY Right October, 2014   right  breast core biopsy, fibroadenomatous changes  . BREAST BIOPSY Right December 2014   FNA retroareolar nodule consistent with fibroadenoma.  Marland Kitchen BREAST BIOPSY Right 12/16/2016   Fibroadenoma in the retroareolar area at 6:00.  Marland Kitchen [place]  . COLONOSCOPY  2015   Dr Allen Norris  . COLONOSCOPY WITH PROPOFOL N/A 05/27/2019   Procedure: COLONOSCOPY WITH PROPOFOL;  Surgeon: Virgel Manifold, MD;  Location: [facility] ENDOSCOPY;  Service: Endoscopy;  Laterality: N/A;  . EMBOLIZATION N/A 10/31/2019   Procedure: EMBOLIZATION;  Surgeon: Algernon Huxley, MD;  Location: [place] [facility];  Service: Cardiovascular;  Laterality: N/A;  . ESOPHAGOGASTRODUODENOSCOPY (EGD) WITH PROPOFOL N/A 05/27/2019   Procedure: ESOPHAGOGASTRODUODENOSCOPY (EGD) WITH PROPOFOL;  Surgeon: Virgel Manifold, MD;  Location: [facility] ENDOSCOPY;  Service: Endoscopy;  Laterality: N/A;  . IVC FILTER INSERTION N/A 05/29/2019   Procedure: IVC FILTER INSERTION;  Surgeon: Algernon Huxley, MD;  Location: [place] [facility];  Service: Cardiovascular;  Laterality: N/A;  . RHINOPLASTY    . TONSILECTOMY, ADENOIDECTOMY, BILATERAL MYRINGOTOMY AND TUBES  2004  . UPPER GASTROINTESTINAL ENDOSCOPY  2015   Dr Allen Norris    Family History  Problem Relation Age of Onset  . Depression Mother   . Migraines Mother   . Diverticulitis Mother   . Hypertension Mother   . Heart disease Father   . Diabetes Father   . Hypertension Father   . Stroke Sister   . Cancer Maternal Aunt   . Breast cancer Maternal Aunt   .  Cancer Maternal Grandmother     Social History:  reports that she has never smoked. She has never used smokeless tobacco. She reports current alcohol use. She reports that she does not use drugs. She drinks alcohol occasionally, in social settings. She has 3 children and 3 grandchildren.She lives in [place]. The patient is ***alone/accompanied by *** today.  Allergies:  Allergies  Allergen Reactions  . Bee Venom Other (See  Comments), Nausea Only, Rash and Shortness Of Breath  . Bupropion Other (See Comments)  . Amoxicillin Hives    Did it involve swelling of the face/tongue/throat, SOB, or low BP? No Did it involve sudden or severe rash/hives, skin peeling, or any reaction on the inside of your mouth or nose? No Did you need to seek medical attention at a hospital or doctor's office? Unknown When did it last happen?5+ years If all above answers are "NO", may proceed with cephalosporin use.   Marcelline Mates     Break out in mouth  . Lisinopril Cough  . Nortriptyline     Unknown reaction   . Orange Fruit [Citrus]     Break out in mouth  . Other     Sulfa eye drops- Scratched corneas  . Penicillins Hives    Did it involve swelling of the face/tongue/throat, SOB, or low BP? No Did it involve sudden or severe rash/hives, skin peeling, or any reaction on the inside of your mouth or nose? No Did you need to seek medical attention at a hospital or doctor's office? Unknown When did it last happen?5+ years If all above answers are "NO", may proceed with cephalosporin use.   . Milnacipran Palpitations    Current Medications: Current Outpatient Medications  Medication Sig Dispense Refill  . acetaminophen (TYLENOL) 500 MG tablet Take 1,000-1,500 mg by mouth every 6 (six) hours as needed for moderate pain or headache.    . albuterol (PROVENTIL HFA;VENTOLIN HFA) 108 (90 BASE) MCG/ACT inhaler Inhale 2 puffs into the lungs every 6 (six) hours as needed for wheezing.    . Carboxymethylcellulose Sodium (REFRESH LIQUIGEL OP) Place 1 drop into both eyes daily as needed (dry eyes).    . Cholecalciferol 125 MCG (5000 UT) TABS Take by mouth.    . clindamycin (CLEOCIN T) 1 % lotion Apply 1 application topically 2 (two) times daily as needed (acne).    . cyclobenzaprine (FLEXERIL) 10 MG tablet Take 1 tablet (10 mg total) by mouth as needed. (Patient not taking: Reported on 11/16/2019) 30 tablet 3  . Difluprednate  (DUREZOL) 0.05 % EMUL Place 1 drop into both eyes every 6 (six) hours as needed (uveitis).     Marland Kitchen diphenhydrAMINE (BENADRYL) 25 mg capsule Take 25 mg by mouth 2 (two) times daily as needed for allergies or sleep.     Marland Kitchen enoxaparin (LOVENOX) 150 MG/ML injection Inject 0.9 mLs (135 mg total) into the skin every 12 (twelve) hours. (Patient not taking: Reported on 11/16/2019) 25 mL 0  . EPINEPHrine (EPIPEN 2-PAK) 0.3 mg/0.3 mL IJ SOAJ injection Inject 0.3 mg into the muscle as needed for anaphylaxis.     Marland Kitchen ergocalciferol (VITAMIN D2) 1.25 MG (50000 UT) capsule Take by mouth.    . fluticasone (FLONASE) 50 MCG/ACT nasal spray Place 1 spray into both nostrils daily as needed for rhinitis.    . Galcanezumab-gnlm 120 MG/ML SOAJ Inject 120 mg into the skin every 28 (twenty-eight) days.     Marland Kitchen levothyroxine (SYNTHROID) 150 MCG tablet Take 150 mcg by mouth daily  before breakfast.     . linaclotide (LINZESS) 72 MCG capsule Take 72 mcg by mouth daily as needed (constipation).    Marland Kitchen loratadine (CLARITIN) 10 MG tablet Take 10 mg by mouth daily.    . Melatonin 3 MG CAPS Take 3 mg by mouth at bedtime as needed (sleep).    . Multiple Vitamin (MULTIVITAMIN WITH MINERALS) TABS tablet Take 1 tablet by mouth daily.    Marland Kitchen nystatin (MYCOSTATIN/NYSTOP) powder Apply topically.    . ondansetron (ZOFRAN) 8 MG tablet Take 8 mg by mouth every 8 (eight) hours as needed for nausea or vomiting.    Marland Kitchen oxybutynin (DITROPAN) 5 MG tablet Take 5 mg by mouth at bedtime.    . pantoprazole (PROTONIX) 40 MG tablet Take 40 mg by mouth daily.     . polyethylene glycol (MIRALAX / GLYCOLAX) 17 g packet PLEASE SEE ATTACHED FOR DETAILED DIRECTIONS    . prednisoLONE acetate (PRED FORTE) 1 % ophthalmic suspension Place 1 drop into both eyes every 4 (four) hours as needed (uveitis).     . promethazine (PHENERGAN) 25 MG tablet Take 25 mg by mouth every 6 (six) hours as needed for nausea or vomiting.    . rosuvastatin (CRESTOR) 20 MG tablet Take 20 mg by  mouth daily.    . sodium fluoride (DENTAGEL) 1.1 % GEL dental gel Place 1 application onto teeth at bedtime.     Marland Kitchen tiZANidine (ZANAFLEX) 4 MG tablet Take 4 mg by mouth every 12 (twelve) hours as needed for muscle spasms.    Marland Kitchen topiramate (TOPAMAX) 100 MG tablet Take 100 mg by mouth 2 (two) times daily.    Marland Kitchen topiramate (TOPAMAX) 50 MG tablet Take 50 mg by mouth 2 (two) times daily.    . traMADol (ULTRAM) 50 MG tablet Take 1 tablet (50 mg total) by mouth every 6 (six) hours as needed for moderate pain. (Patient not taking: Reported on 11/16/2019) 12 tablet 0  . warfarin (COUMADIN) 3 MG tablet Take by mouth.     No current facility-administered medications for this visit.    Review of Systems  Constitutional: Positive for diaphoresis (due to [facility]), malaise/fatigue (from insomnia) and weight loss (intentional). Negative for chills and fever.       Feels "ok".  HENT: Negative for congestion, ear discharge, ear pain, hearing loss, nosebleeds, sore throat and tinnitus.   Eyes: Negative for blurred vision.  Respiratory: Positive for shortness of breath (chronic). Negative for cough, hemoptysis and sputum production.   Cardiovascular: Positive for palpitations (due to medication). Negative for chest pain and leg swelling.  Gastrointestinal: Positive for abdominal pain and constipation. Negative for blood in stool, diarrhea, heartburn, melena, nausea and vomiting.       Irritable bowel syndrome (IBS). Eating 3 meals daily.  Genitourinary: Negative for dysuria, frequency, hematuria and urgency.  Musculoskeletal: Positive for back pain. Negative for joint pain, myalgias and neck pain.       Fibromyalgia.  Skin: Negative for itching and rash.  Neurological: Positive for headaches. Negative for dizziness, tingling, sensory change and weakness.  Endo/Heme/Allergies: Bruises/bleeds easily (bruising on legs).  Psychiatric/Behavioral: Negative for depression and memory loss. The patient has insomnia. The  patient is not nervous/anxious.   All other systems reviewed and are negative.   Performance status (ECOG): 1  Vitals There were no vitals taken for this visit.   Physical Exam  Constitutional: She is oriented to person, place, and time. She appears well-developed and well-nourished.  HENT:  Head: Normocephalic  and atraumatic.  Mouth/Throat: Oropharynx is clear and moist. No oropharyngeal exudate.  Curly long hair. Mask.  Eyes: Pupils are equal, round, and reactive to light. Conjunctivae and EOM are normal. No scleral icterus.  Glasses.  Cardiovascular: Normal rate, regular rhythm and normal heart sounds.  No murmur heard. Pulmonary/Chest: Effort normal and breath sounds normal. No respiratory distress. She has no wheezes. She has no rales. She exhibits no tenderness.  Abdominal: Soft. Bowel sounds are normal. She exhibits no distension and no mass. There is no abdominal tenderness. There is no rebound and no guarding.  Musculoskeletal:        General: No tenderness or edema. Normal range of motion.     Cervical back: Normal range of motion and neck supple.  Lymphadenopathy:    She has no cervical adenopathy.    She has no axillary adenopathy.       Right: No supraclavicular adenopathy present.       Left: No supraclavicular adenopathy present.  Neurological: She is alert and oriented to person, place, and time.  Skin: Skin is warm and dry. She is not diaphoretic.  Psychiatric: She has a normal mood and affect. Her behavior is normal. Judgment and thought content normal.  Nursing note and vitals reviewed.   No visits with results within 3 Day(s) from this visit.  Latest known visit with results is:  Admission on 10/31/2019, Discharged on 11/01/2019  Component Date Value Ref Range Status  . WBC 10/31/2019 20.2* 4.0 - 10.5 K/uL Final  . RBC 10/31/2019 3.34* 3.87 - 5.11 MIL/uL Final  . Hemoglobin 10/31/2019 9.5* 12.0 - 15.0 g/dL Final  . HCT 10/31/2019 29.3* 36.0 - 46.0 % Final    . MCV 10/31/2019 87.7  80.0 - 100.0 fL Final  . MCH 10/31/2019 28.4  26.0 - 34.0 pg Final  . MCHC 10/31/2019 32.4  30.0 - 36.0 g/dL Final  . RDW 10/31/2019 14.0  11.5 - 15.5 % Final  . Platelets 10/31/2019 323  150 - 400 K/uL Final  . nRBC 10/31/2019 0.0  0.0 - 0.2 % Final  . Neutrophils Relative % 10/31/2019 74  % Final  . Neutro Abs 10/31/2019 15.0* 1.7 - 7.7 K/uL Final  . Lymphocytes Relative 10/31/2019 19  % Final  . Lymphs Abs 10/31/2019 3.9  0.7 - 4.0 K/uL Final  . Monocytes Relative 10/31/2019 6  % Final  . Monocytes Absolute 10/31/2019 1.1* 0.1 - 1.0 K/uL Final  . Eosinophils Relative 10/31/2019 0  % Final  . Eosinophils Absolute 10/31/2019 0.0  0.0 - 0.5 K/uL Final  . Basophils Relative 10/31/2019 0  % Final  . Basophils Absolute 10/31/2019 0.1  0.0 - 0.1 K/uL Final  . Immature Granulocytes 10/31/2019 1  % Final  . Abs Immature Granulocytes 10/31/2019 0.13* 0.00 - 0.07 K/uL Final   Performed at [facility], [address] [place], [place] [address]  . Magnesium 10/31/2019 2.2  1.7 - 2.4 mg/dL Final   Performed at [facility], [address] [place], [place] [address]  . Sodium 10/31/2019 138  135 - 145 mmol/L Final  . Potassium 10/31/2019 4.3  3.5 - 5.1 mmol/L Final  . Chloride 10/31/2019 108  98 - 111 mmol/L Final  . CO2 10/31/2019 14* 22 - 32 mmol/L Final  . Glucose, Bld 10/31/2019 308* 70 - 99 mg/dL Final   Glucose reference range applies only to samples taken after fasting for at least 8 hours.  . BUN 10/31/2019 20  6 -  20 mg/dL Final  . Creatinine, Ser 10/31/2019 1.84* 0.44 - 1.00 mg/dL Final  . Calcium 10/31/2019 8.4* 8.9 - 10.3 mg/dL Final  . Total Protein 10/31/2019 6.3* 6.5 - 8.1 g/dL Final  . Albumin 10/31/2019 3.3* 3.5 - 5.0 g/dL Final  . AST 10/31/2019 17  15 - 41 U/L Final  . ALT 10/31/2019 15  0 - 44 U/L Final  . Alkaline Phosphatase 10/31/2019 35* 38 - 126 U/L Final  . Total Bilirubin 10/31/2019 0.7  0.3 - 1.2 mg/dL Final  . GFR  calc non Af Amer 10/31/2019 33* >60 mL/min Final  . GFR calc Af Amer 10/31/2019 38* >60 mL/min Final  . Anion gap 10/31/2019 16* 5 - 15 Final   Performed at [facility], [address], [place], [place] [address]  . Glucose-Capillary 10/31/2019 270* 70 - 99 mg/dL Final   Glucose reference range applies only to samples taken after fasting for at least 8 hours.  . Lactic Acid, Venous 10/31/2019 <0.3* 0.5 - 1.9 mmol/L Final   Performed at [facility], [address] [place], [place] [address]  . Lactic Acid, Venous 10/31/2019 5.3* 0.5 - 1.9 mmol/L Final   Comment: CRITICAL RESULT CALLED TO, READ BACK BY AND VERIFIED WITH  PAIGE JOHNSON AT 0725 10/31/19 SDR Performed at [facility], [address], [place], [place] [address]   . Procalcitonin 10/31/2019 0.12  ng/mL Final   Comment:        Interpretation: PCT (Procalcitonin) <= 0.5 ng/mL: Systemic infection (sepsis) is not likely. Local bacterial infection is possible. (NOTE)       Sepsis PCT Algorithm           Lower Respiratory Tract                                      Infection PCT Algorithm    ----------------------------     ----------------------------         PCT < 0.25 ng/mL                PCT < 0.10 ng/mL         Strongly encourage             Strongly discourage   discontinuation of antibiotics    initiation of antibiotics    ----------------------------     -----------------------------       PCT 0.25 - 0.50 ng/mL            PCT 0.10 - 0.25 ng/mL               OR       >80% decrease in PCT            Discourage initiation of                                            antibiotics      Encourage discontinuation           of antibiotics    ----------------------------     -----------------------------         PCT >= 0.50 ng/mL              PCT 0.26 - 0.50 ng/mL  AND                                 <80% decrease in PCT             Encourage initiation of                                              antibiotics       Encourage continuation           of antibiotics    ----------------------------     -----------------------------        PCT >= 0.50 ng/mL                  PCT > 0.50 ng/mL               AND         increase in PCT                  Strongly encourage                                      initiation of antibiotics    Strongly encourage escalation           of antibiotics                                     -----------------------------                                           PCT <= 0.25 ng/mL                                                 OR                                        > 80% decrease in PCT                                     Discontinue / Do not initiate                                             antibiotics Performed at [facility], [address] [place], [place] [address]   . B Natriuretic Peptide 10/31/2019 34.0  0.0 - 100.0 pg/mL Final   Performed at [facility], [address] [place], [place] [address]  . Prothrombin Time 10/31/2019 39.4* 11.4 - 15.2 seconds Final  . INR 10/31/2019 4.0* 0.8 - 1.2 Final   Comment: CRITICAL RESULT CALLED TO, READ BACK BY AND VERIFIED WITH: [email or web address]  on  10/31/19 by hkp (NOTE) INR goal varies based on device and disease states. Performed at [facility], [address] [place], [place] [address]   . Specimen Description 10/31/2019 BLOOD RIGHT AC   Final  . Special Requests 10/31/2019 BOTTLES DRAWN AEROBIC AND ANAEROBIC Blood Culture adequate volume   Final  . Culture 10/31/2019    Final                   Value:NO GROWTH 5 DAYS Performed at [facility], [address] [place], [place] [address]   . Report Status 10/31/2019 11/05/2019 FINAL   Final  . Specimen Description 10/31/2019 BLOOD RIGHT HAND   Final  . Special Requests 10/31/2019 BOTTLES DRAWN AEROBIC AND ANAEROBIC Blood Culture results may not be optimal due to an inadequate volume of  blood received in culture bottles   Final  . Culture 10/31/2019    Final                   Value:NO GROWTH 5 DAYS Performed at [facility], [address] [place], [place] [address]   . Report Status 10/31/2019 11/05/2019 FINAL   Final  . Lipase 10/31/2019 54* 11 - 51 U/L Final   Performed at [facility], [address] [place],  [address]  . SARS Coronavirus 2 by RT PCR 10/31/2019 NEGATIVE  NEGATIVE Final   Comment: (NOTE) SARS-CoV-2 target nucleic acids are NOT DETECTED. The SARS-CoV-2 RNA is generally detectable in upper respiratoy specimens during the acute phase of infection. The lowest concentration of SARS-CoV-2 viral copies this assay can detect is 131 copies/mL. A negative result does not preclude SARS-Cov-2 infection and should not be used as the sole basis for treatment or other patient management decisions. A negative result may occur with  improper specimen collection/handling, submission of specimen other than nasopharyngeal swab, presence of viral mutation(s) within the areas targeted by this assay, and inadequate number of viral copies (<131 copies/mL). A negative result must be combined with clinical observations, patient history, and epidemiological information. The expected result is Negative. Fact Sheet for Patients:  PinkCheek.be Fact Sheet for Healthcare Providers:  GravelBags.it This test is not yet ap                          proved or cleared by the [place] FDA and  has been authorized for detection and/or diagnosis of SARS-CoV-2 by FDA under an Emergency Use Authorization (EUA). This EUA will remain  in effect (meaning this test can be used) for the duration of the COVID-19 declaration under Section 564(b)(1) of the Act, 21 U.S.C. section 360bbb-3(b)(1), unless the authorization is terminated or revoked sooner.   . Influenza A by PCR 10/31/2019 NEGATIVE  NEGATIVE  Final  . Influenza B by PCR 10/31/2019 NEGATIVE  NEGATIVE Final   Comment: (NOTE) The Xpert Xpress SARS-CoV-2/FLU/RSV assay is intended as an aid in  the diagnosis of influenza from Nasopharyngeal swab specimens and  should not be used as a sole basis for treatment. Nasal washings and  aspirates are unacceptable for Xpert Xpress SARS-CoV-2/FLU/RSV  testing. Fact Sheet for Patients: PinkCheek.be Fact Sheet for Healthcare Providers: GravelBags.it This test is not yet approved or cleared by the [place] FDA and  has been authorized for detection and/or diagnosis of SARS-CoV-2 by  FDA under an Emergency Use Authorization (EUA). This EUA will remain  in effect (meaning this test can be used) for  the duration of the  Covid-19 declaration under Section 564(b)(1) of the Act, 21  U.S.C. section 360bbb-3(b)(1), unless the authorization is  terminated or revoked. Performed at [facility], [address] [place], [place] [address]   . ABO/RH(D) 10/31/2019 O POS   Final  . Antibody Screen 10/31/2019 NEG   Final  . Sample Expiration 10/31/2019 11/03/2019,2359   Final  . Unit Number 10/31/2019 [id number]   Final  . Blood Component Type 10/31/2019 RED CELLS,LR   Final  . Unit division 10/31/2019 00   Final  . Status of Unit 10/31/2019 REL FROM [facility]   Final  . Transfusion Status 10/31/2019 OK TO TRANSFUSE   Final  . Crossmatch Result 10/31/2019 COMPATIBLE   Final  . Unit tag comment 10/31/2019 EMERGENCY RELEASE   Final  . Unit Number 10/31/2019 [id number]   Final  . Blood Component Type 10/31/2019 RED CELLS,LR   Final  . Unit division 10/31/2019 00   Final  . Status of Unit 10/31/2019 ISSUED,FINAL   Final  . Transfusion Status 10/31/2019 OK TO TRANSFUSE   Final  . Crossmatch Result 10/31/2019 COMPATIBLE   Final  . Unit tag comment 10/31/2019 EMERGENCY RELEASE   Final  . Unit Number 10/31/2019 [id number]   Final    . Blood Component Type 10/31/2019 RED CELLS,LR   Final  . Unit division 10/31/2019 00   Final  . Status of Unit 10/31/2019 ISSUED,FINAL   Final  . Transfusion Status 10/31/2019 OK TO TRANSFUSE   Final  . Crossmatch Result 10/31/2019 Compatible   Final  . Order Confirmation 10/31/2019    Final                   Value:ORDER PROCESSED BY BLOOD BANK Performed at [facility], [address], [place], [place] [address]   . ISSUE DATE / TIME 10/31/2019 [id number]   Final  . Blood Product Unit Number 10/31/2019 [id number]   Final  . PRODUCT CODE 10/31/2019 [id number]   Final  . Unit Type and Rh 10/31/2019 5100   Final  . Blood Product Expiration Date 10/31/2019 [id number]   Final  . ISSUE DATE / TIME 10/31/2019 [id number]   Final  . Blood Product Unit Number 10/31/2019 [id number]   Final  . Unit Type and Rh 10/31/2019 9500   Final  . Blood Product Expiration Date 10/31/2019 [id number]   Final  . ISSUE DATE / TIME 10/31/2019 [id number]   Final  . Blood Product Unit Number 10/31/2019 [id number]   Final  . PRODUCT CODE 10/31/2019 [id number]   Final  . Unit Type and Rh 10/31/2019 5100   Final  . Blood Product Expiration Date 10/31/2019 [id number]   Final  . Lactic Acid, Venous 10/31/2019 2.0* 0.5 - 1.9 mmol/L Final   Comment: CRITICAL VALUE NOTED. VALUE IS CONSISTENT WITH PREVIOUSLY REPORTED/CALLED VALUE SNG Performed at [facility], [address] [place], [place] [address]   . WBC 10/31/2019 19.1* 4.0 - 10.5 K/uL Final  . RBC 10/31/2019 2.95* 3.87 - 5.11 MIL/uL Final  . Hemoglobin 10/31/2019 8.6* 12.0 - 15.0 g/dL Final  . HCT 10/31/2019 25.8* 36.0 - 46.0 % Final  . MCV 10/31/2019 87.5  80.0 - 100.0 fL Final  . MCH 10/31/2019 29.2  26.0 - 34.0 pg Final  . MCHC 10/31/2019 33.3  30.0 - 36.0 g/dL Final  . RDW 10/31/2019 14.0  11.5 - 15.5 % Final  . Platelets 10/31/2019 257  150 - 400 K/uL Final  .  nRBC 10/31/2019 0.0  0.0 - 0.2 % Final   Performed at  [facility], [address] [place], [place] [address]  . Sodium 10/31/2019 139  135 - 145 mmol/L Final  . Potassium 10/31/2019 4.2  3.5 - 5.1 mmol/L Final  . Chloride 10/31/2019 112* 98 - 111 mmol/L Final  . CO2 10/31/2019 18* 22 - 32 mmol/L Final  . Glucose, Bld 10/31/2019 157* 70 - 99 mg/dL Final   Glucose reference range applies only to samples taken after fasting for at least 8 hours.  . BUN 10/31/2019 21* 6 - 20 mg/dL Final  . Creatinine, Ser 10/31/2019 1.50* 0.44 - 1.00 mg/dL Final  . Calcium 10/31/2019 7.6* 8.9 - 10.3 mg/dL Final  . GFR calc non Af Amer 10/31/2019 42* >60 mL/min Final  . GFR calc Af Amer 10/31/2019 49* >60 mL/min Final  . Anion gap 10/31/2019 9  5 - 15 Final   Performed at [facility], [address] [place], [place] [address]  . aPTT 10/31/2019 54* 24 - 36 seconds Final   Comment:        IF BASELINE aPTT IS ELEVATED, SUGGEST PATIENT RISK ASSESSMENT BE USED TO DETERMINE APPROPRIATE ANTICOAGULANT THERAPY. Performed at [facility], [address], [place], [place] [address]   . Prothrombin Time 10/31/2019 18.0* 11.4 - 15.2 seconds Final  . INR 10/31/2019 1.5* 0.8 - 1.2 Final   Comment: (NOTE) INR goal varies based on device and disease states. Performed at [facility], [address], [place], [place] [address]   . Lactic Acid, Venous 10/31/2019 1.6  0.5 - 1.9 mmol/L Final   Performed at [facility], [address] [place], [place] [address]  . Glucose-Capillary 10/31/2019 151* 70 - 99 mg/dL Final   Glucose reference range applies only to samples taken after fasting for at least 8 hours.  Marland Kitchen MRSA by PCR 10/31/2019 NEGATIVE  NEGATIVE Final   Comment:        The GeneXpert MRSA Assay (FDA approved for NASAL specimens only), is one component of a comprehensive MRSA colonization surveillance program. It is not intended to diagnose MRSA infection nor to guide or monitor treatment for MRSA  infections. Performed at [facility], [address], [place], [place] [address]   . Prothrombin Time 10/31/2019 19.4* 11.4 - 15.2 seconds Final  . INR 10/31/2019 1.7* 0.8 - 1.2 Final   Comment: (NOTE) INR goal varies based on device and disease states. Performed at [facility], [address], [place], [place] [address]   . WBC 10/31/2019 19.2* 4.0 - 10.5 K/uL Final  . RBC 10/31/2019 3.35* 3.87 - 5.11 MIL/uL Final  . Hemoglobin 10/31/2019 9.9* 12.0 - 15.0 g/dL Final  . HCT 10/31/2019 29.5* 36.0 - 46.0 % Final  . MCV 10/31/2019 88.1  80.0 - 100.0 fL Final  . MCH 10/31/2019 29.6  26.0 - 34.0 pg Final  . MCHC 10/31/2019 33.6  30.0 - 36.0 g/dL Final  . RDW 10/31/2019 13.9  11.5 - 15.5 % Final  . Platelets 10/31/2019 237  150 - 400 K/uL Final  . nRBC 10/31/2019 0.0  0.0 - 0.2 % Final   Performed at [facility], [address] [place],  [address]  . Sodium 10/31/2019 142  135 - 145 mmol/L Final  . Potassium 10/31/2019 4.3  3.5 - 5.1 mmol/L Final  . Chloride 10/31/2019 113* 98 - 111 mmol/L Final  . CO2 10/31/2019 20* 22 - 32 mmol/L  Final  . Glucose, Bld 10/31/2019 151* 70 - 99 mg/dL Final   Glucose reference range applies only to samples taken after fasting for at least 8 hours.  . BUN 10/31/2019 24* 6 - 20 mg/dL Final  . Creatinine, Ser 10/31/2019 1.63* 0.44 - 1.00 mg/dL Final  . Calcium 10/31/2019 7.7* 8.9 - 10.3 mg/dL Final  . GFR calc non Af Amer 10/31/2019 38* >60 mL/min Final  . GFR calc Af Amer 10/31/2019 44* >60 mL/min Final  . Anion gap 10/31/2019 9  5 - 15 Final   Performed at [facility], [address], [place], [place] [address]  . Glucose-Capillary 10/31/2019 141* 70 - 99 mg/dL Final   Glucose reference range applies only to samples taken after fasting for at least 8 hours.  . Glucose-Capillary 10/31/2019 121* 70 - 99 mg/dL Final   Glucose reference range applies only to samples taken after fasting for at least 8 hours.     Assessment:  Angela [name] is a 44 y.o. 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 She is on Coumadin.  Work-up on 12/22/2018 included the following normal/negative labs: CBC with diff, lupus anticoagulant, protein C activity, and protein S activity/antigen.Normal labson 01/06/2019 included: Factor V Leiden, prothrombin gene mutation, anticardiolipin antibodies, and beta2-glycoprotein antibodies.  Chest CT angiogramon 12/20/2018 revealed a wedge-shaped opacity in the right lower lobelateral basal segment consistent with a pulmonary infarct. There was relative hypoenhancement within the associated segmental artery probablysecondary toa small pulmonary embolus, though it wasdifficult to assessgiven limitationsof beam attenuation caused by the patient's body habitus. There was a small right pleural effusion with associated atelectasis.  Bilateral lower extremity duplexon 12/21/2018 revealed no femoropopliteal and no calf DVT in the visualized calf veins.  AbdomenandpelvisCTon 01/16/2019 revealed no acute findings. There was a new 2 cm pleural-based nodule in posterior right lower lobe.  PET scanon 01/26/2019 revealed no hypermetabolic findings highly suspicious for a malignant process. 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  [facility] for [facility]osis and a lower GI bleed. CTA abdomen and pelvison 05/26/2019 showed scattered diverticulosis, large stool burden. Tagged RBCscanon 05/26/2019 showed no evidence of GI bleed. IVC filterwas placedon 05/29/2019. EGDand colonoscopyon 05/27/2019 showed no evidence of active or recent bleeding. The source of patient's  bleeding was likely internal hemorrhoids.  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  She underwent embolization of the left inferior epigastric artery.   Bilateral mammogramandleft breast ultrasoundon 06/26/2019 revealed a probably benign,12 mm fibrocystic lesionat the 9:30 o'clock position, 7 cm the nipple. Ultrasound revealeda cystic and solid lesion with echogenic foci consistent with calcifications. The appearancewas consistent with a fibrocystic lesion.Left unilateral diagnostic mammogram and ultrasound on 12/26/2019 revealed indeterminate 1.3 cm x 1 x 1.2 cm mixed cystic and solid mass in the left breast at the 9:30 position 10 cm from the nipple.  Ultrasound-guided biopsy on 01/03/2020 revealed fibrocystic changes with papillary apocrine metaplasia.  There was no atypia or malignancy.   Symptomatically,  Plan: 1.   Labs today:  CBC with diff, ferritin, iron studies, B12, folate.   2.Recurrent pulmonary embolism Patientwithpulmonary embolism in 2006and2020. Work-upwasnegative. PET scan revealed no malignancy. She has an IVC filter in place. She is currently taking Coumadin 3 mg/day. INR is being monitored by Dr. Iona Beard. Marlowe Sax  is 2-3.             No current plan for Xarelto or Eliquis secondary to weight. 3.  S/p gastric sleeve surgery             Review interval surgery and complication of hemoperitoneum.             Discuss plan to follow-up on ferritin and B12 as she will likely become deficient. 4.Mammogram changes Bilateral mammogram and left breast ultrasound on 06/26/2019 revealed likely cystic lesion (benign). Follow-up diagnostic left mammogram and ultrasound on 12/26/2019. 5.RTC in 6 weeks for MD assessment and labs (CBC with diff, ferritin, iron studies, B12, folate).  I  discussed the assessment and treatment plan with the patient.  The patient was provided an opportunity to ask questions and all were answered.  The patient agreed with the plan and demonstrated an understanding of the instructions.  The patient was advised to call back if the symptoms worsen or if the condition fails to improve as anticipated.  I provided *** minutes of face-to-face time during this this encounter and > 50% was spent counseling as documented under my assessment and plan.    Lequita Asal, MD, PhD    01/01/2020, 1:46 PM  I, Selena Batten, am acting as scribe for [employer]. Mike Gip, MD, PhD.  {Add scribe attestation statement}

## 2020-01-02 ENCOUNTER — Encounter: Payer: Self-pay | Admitting: Hematology and Oncology

## 2020-01-03 ENCOUNTER — Ambulatory Visit
Admission: RE | Admit: 2020-01-03 | Discharge: 2020-01-03 | Disposition: A | Discharge status: Home / Self Care | Payer: Medicare Other | Source: Ambulatory Visit | Attending: Hematology and Oncology | Admitting: Hematology and Oncology

## 2020-01-03 ENCOUNTER — Encounter: Payer: Self-pay | Admitting: Family Medicine

## 2020-01-03 DIAGNOSIS — N6320 Unspecified lump in the left breast, unspecified quadrant: Secondary | ICD-10-CM

## 2020-01-03 DIAGNOSIS — R928 Other abnormal and inconclusive findings on diagnostic imaging of breast: Secondary | ICD-10-CM

## 2020-01-03 HISTORY — PX: BREAST BIOPSY: SHX20

## 2020-01-04 LAB — SURGICAL PATHOLOGY

## 2020-01-14 DIAGNOSIS — R928 Other abnormal and inconclusive findings on diagnostic imaging of breast: Secondary | ICD-10-CM | POA: Insufficient documentation

## 2020-01-15 ENCOUNTER — Other Ambulatory Visit (INDEPENDENT_AMBULATORY_CARE_PROVIDER_SITE_OTHER): Payer: Self-pay | Admitting: Vascular Surgery

## 2020-01-15 ENCOUNTER — Encounter: Payer: Self-pay | Admitting: Hematology and Oncology

## 2020-01-15 ENCOUNTER — Inpatient Hospital Stay: Payer: Medicare Other

## 2020-01-15 ENCOUNTER — Inpatient Hospital Stay: Payer: Medicare Other | Attending: Hematology and Oncology | Admitting: Hematology and Oncology

## 2020-01-15 DIAGNOSIS — Z9884 Bariatric surgery status: Secondary | ICD-10-CM | POA: Diagnosis not present

## 2020-01-15 DIAGNOSIS — I2699 Other pulmonary embolism without acute cor pulmonale: Secondary | ICD-10-CM | POA: Diagnosis not present

## 2020-01-15 DIAGNOSIS — R200 Anesthesia of skin: Secondary | ICD-10-CM

## 2020-01-15 DIAGNOSIS — R928 Other abnormal and inconclusive findings on diagnostic imaging of breast: Secondary | ICD-10-CM

## 2020-01-15 NOTE — Progress Notes (Signed)
Pt sent Mychart message requesting to switch in person visit to [place] visit. Pt reports she has an upset stomcah and has a cold. Did not want to come in the building feeling like this. Would like to come for labs at a later date.

## 2020-01-15 NOTE — Progress Notes (Signed)
[facility]  [address], [address] [place], [place] [address] Phone: [phone]  Fax: [phone]   Telemedicine Office Visit:  01/15/2020  Referring physician: Sharyne Peach, MD  I connected with Angela [name] on 01/15/20 at 3:26 PM by videoconferencing and verified that I was speaking with the correct person using 2 identifiers.  The patient was at home.  I discussed the limitations, risk, security and privacy concerns of performing an evaluation and management service by videoconferencing and the availability of in person appointments.  I also discussed with the patient that there may be a patient responsible charge related to this service.  The patient expressed understanding and agreed to proceed.   Chief Complaint: Angela [name] is a 44 y.o. female s/p gastric bypass surgery and a history of recurrent pulmonary embolism who is seen virtually for 6 week assessment.   HPI: The patient was last seen in the hematology clinic on 11/17/2019 for a telemedicine visit. At that time, she felt tired and sore after her laparoscopic sleeve gastrectomy (44/81/8563) complicated by hemoperitoneum.  She underwent embolization of the left inferior epigastric artery.  Her INR was being followed closely by Dr Iona Beard.  INR followed was 1.2 on 01/04/2020, 1.7 on 12/28/2019, 2.0 on 12/22/2019, and 1.7 on 12/06/2019.   She had pain in her left leg on 12/05/2019. Left leg pain extended from left thigh to her ankle. Patient was advised to go to the emergency room. Patient preferred to be seen in the symptom management clinic. Left lower extremity venous doppler ultrasound on 12/07/2019 revealed no evidence of left lower extremity deep venous thrombosis.  Left unilateral diagnostic mammogram and ultrasound on 12/26/2019 revealed indeterminate 1.3 cm x 1 x 1.2 cm mixed cystic and solid mass in the left breast at the 9:30 position 10 cm from the nipple. She underwent  ultrasound-guided biopsy on 01/03/2020.  Pathology revealed fibrocystic changes with papillary apocrine metaplasia.  There was no atypia or malignancy.  During the interim, she has come down with a cold. She has been self quarantining, but does not believe that it's COVID. She has had a hard time getting her INR checked with Dr. Iona Beard. She was able to get an INR device delivered to her home. She notes difficulty communicating with Dr. Cleon Dew office regarding her INR and Coumadin.  She notes that she has a car, but that it is not reliable transportation as she is sharing it. The other car that she has access to is not reliable mechanically. She also has difficulty with childcare as she watches her grandchildren from Tuesday through Friday.   She has switched her primary care from Dr. Iona Beard to a provider at the [facility] with the help of [facility].   She continues on a weight loss regimen; she notes that she is able to eat more protein than before.    Past Medical History:  Diagnosis Date  . Anemia   . Anxiety   . Asthma   . Depression   . Fibromyalgia   . GERD (gastroesophageal reflux disease)   . Hypertension   . Irritable bowel syndrome (IBS)   . Pseudotumor cerebri   . Pulmonary embolism ([place])   . Raynaud's disease   . Thyroid disease    hypothyroid  . Vision disturbance     Past Surgical History:  Procedure Laterality Date  . ABDOMINAL HYSTERECTOMY  02/06/13  . BREAST BIOPSY Right October, 2014   right breast core biopsy, fibroadenomatous changes  .  BREAST BIOPSY Right December 2014   FNA retroareolar nodule consistent with fibroadenoma.  Marland Kitchen BREAST BIOPSY Right 12/16/2016   Fibroadenoma in the retroareolar area at 6:00.  Marland Kitchen [place]  . COLONOSCOPY  2015   Dr Allen Norris  . COLONOSCOPY WITH PROPOFOL N/A 05/27/2019   Procedure: COLONOSCOPY WITH PROPOFOL;  Surgeon: Virgel Manifold, MD;  Location: [facility] ENDOSCOPY;  Service: Endoscopy;  Laterality:  N/A;  . EMBOLIZATION N/A 10/31/2019   Procedure: EMBOLIZATION;  Surgeon: Algernon Huxley, MD;  Location: [place] [facility];  Service: Cardiovascular;  Laterality: N/A;  . ESOPHAGOGASTRODUODENOSCOPY (EGD) WITH PROPOFOL N/A 05/27/2019   Procedure: ESOPHAGOGASTRODUODENOSCOPY (EGD) WITH PROPOFOL;  Surgeon: Virgel Manifold, MD;  Location: [facility] ENDOSCOPY;  Service: Endoscopy;  Laterality: N/A;  . IVC FILTER INSERTION N/A 05/29/2019   Procedure: IVC FILTER INSERTION;  Surgeon: Algernon Huxley, MD;  Location: [place] [facility];  Service: Cardiovascular;  Laterality: N/A;  . RHINOPLASTY    . TONSILECTOMY, ADENOIDECTOMY, BILATERAL MYRINGOTOMY AND TUBES  2004  . UPPER GASTROINTESTINAL ENDOSCOPY  2015   Dr Allen Norris    Family History  Problem Relation Age of Onset  . Depression Mother   . Migraines Mother   . Diverticulitis Mother   . Hypertension Mother   . Heart disease Father   . Diabetes Father   . Hypertension Father   . Stroke Sister   . Cancer Maternal Aunt   . Breast cancer Maternal Aunt   . Cancer Maternal Grandmother     Social History:  reports that she has never smoked. She has never used smokeless tobacco. She reports current alcohol use. She reports that she does not use drugs. She drinks alcohol occasionally, in social settings. She has 3 children and 3 grandchildren.She lives in [place]. The patient is alone today.  Participants in the patient's visit and their role in the encounter included the patient, [employer], and Janeann Merl, Therapist, sports, today.  The intake visit was provided by Janeann Merl, RN.   Allergies:  Allergies  Allergen Reactions  . Bee Venom Other (See Comments), Nausea Only, Rash and Shortness Of Breath  . Bupropion Other (See Comments)  . Amoxicillin Hives    Did it involve swelling of the face/tongue/throat, SOB, or low BP? No Did it involve sudden or severe rash/hives, skin peeling, or any reaction on the inside of your mouth or nose? No Did you  need to seek medical attention at a hospital or doctor's office? Unknown When did it last happen?5+ years If all above answers are "NO", may proceed with cephalosporin use.   Marcelline Mates     Break out in mouth  . Lisinopril Cough  . Nortriptyline     Unknown reaction   . Orange Fruit [Citrus]     Break out in mouth  . Other     Sulfa eye drops- Scratched corneas  . Penicillins Hives    Did it involve swelling of the face/tongue/throat, SOB, or low BP? No Did it involve sudden or severe rash/hives, skin peeling, or any reaction on the inside of your mouth or nose? No Did you need to seek medical attention at a hospital or doctor's office? Unknown When did it last happen?5+ years If all above answers are "NO", may proceed with cephalosporin use.   . Milnacipran Palpitations    Current Medications: Current Outpatient Medications  Medication Sig Dispense Refill  . acetaminophen (TYLENOL) 500 MG tablet Take 1,000-1,500 mg by mouth every 6 (  six) hours as needed for moderate pain or headache.    . albuterol (PROVENTIL HFA;VENTOLIN HFA) 108 (90 BASE) MCG/ACT inhaler Inhale 2 puffs into the lungs every 6 (six) hours as needed for wheezing.    . Carboxymethylcellulose Sodium (REFRESH LIQUIGEL OP) Place 1 drop into both eyes daily as needed (dry eyes).    . Cholecalciferol 125 MCG (5000 UT) TABS Take by mouth.    . clindamycin (CLEOCIN T) 1 % lotion Apply 1 application topically 2 (two) times daily as needed (acne).    . cyclobenzaprine (FLEXERIL) 10 MG tablet Take 1 tablet (10 mg total) by mouth as needed. 30 tablet 3  . Difluprednate (DUREZOL) 0.05 % EMUL Place 1 drop into both eyes every 6 (six) hours as needed (uveitis).     Marland Kitchen diphenhydrAMINE (BENADRYL) 25 mg capsule Take 25 mg by mouth 2 (two) times daily as needed for allergies or sleep.     Marland Kitchen enoxaparin (LOVENOX) 150 MG/ML injection Inject 0.9 mLs (135 mg total) into the skin every 12 (twelve) hours. 25 mL 0  . EPINEPHrine  (EPIPEN 2-PAK) 0.3 mg/0.3 mL IJ SOAJ injection Inject 0.3 mg into the muscle as needed for anaphylaxis.     Marland Kitchen ergocalciferol (VITAMIN D2) 1.25 MG (50000 UT) capsule Take by mouth.    . fluticasone (FLONASE) 50 MCG/ACT nasal spray Place 1 spray into both nostrils daily as needed for rhinitis.    . Galcanezumab-gnlm 120 MG/ML SOAJ Inject 120 mg into the skin every 28 (twenty-eight) days.     Marland Kitchen levothyroxine (SYNTHROID) 150 MCG tablet Take 150 mcg by mouth daily before breakfast.     . linaclotide (LINZESS) 72 MCG capsule Take 72 mcg by mouth daily as needed (constipation).    Marland Kitchen loratadine (CLARITIN) 10 MG tablet Take 10 mg by mouth daily.    . Melatonin 3 MG CAPS Take 3 mg by mouth at bedtime as needed (sleep).    . Multiple Vitamin (MULTIVITAMIN WITH MINERALS) TABS tablet Take 1 tablet by mouth daily.    Marland Kitchen nystatin (MYCOSTATIN/NYSTOP) powder Apply topically.    . ondansetron (ZOFRAN) 8 MG tablet Take 8 mg by mouth every 8 (eight) hours as needed for nausea or vomiting.    Marland Kitchen oxybutynin (DITROPAN) 5 MG tablet Take 5 mg by mouth at bedtime.    . pantoprazole (PROTONIX) 40 MG tablet Take 40 mg by mouth daily.     . polyethylene glycol (MIRALAX / GLYCOLAX) 17 g packet PLEASE SEE ATTACHED FOR DETAILED DIRECTIONS    . prednisoLONE acetate (PRED FORTE) 1 % ophthalmic suspension Place 1 drop into both eyes every 4 (four) hours as needed (uveitis).     . promethazine (PHENERGAN) 25 MG tablet Take 25 mg by mouth every 6 (six) hours as needed for nausea or vomiting.    . rosuvastatin (CRESTOR) 20 MG tablet Take 20 mg by mouth daily.    . sodium fluoride (DENTAGEL) 1.1 % GEL dental gel Place 1 application onto teeth at bedtime.     Marland Kitchen tiZANidine (ZANAFLEX) 4 MG tablet Take 4 mg by mouth every 12 (twelve) hours as needed for muscle spasms.    Marland Kitchen topiramate (TOPAMAX) 100 MG tablet Take 100 mg by mouth 2 (two) times daily.    Marland Kitchen topiramate (TOPAMAX) 50 MG tablet Take 50 mg by mouth 2 (two) times daily.    .  traMADol (ULTRAM) 50 MG tablet Take 1 tablet (50 mg total) by mouth every 6 (six) hours as needed for moderate  pain. 12 tablet 0  . warfarin (COUMADIN) 3 MG tablet Take by mouth.     No current facility-administered medications for this visit.    Review of Systems  Constitutional: Positive for diaphoresis (due to [facility]), malaise/fatigue (from insomnia) and weight loss (intentional). Negative for chills and fever.       Feels "ok".  HENT: Negative for congestion, ear discharge, ear pain, hearing loss, nosebleeds, sore throat and tinnitus.   Eyes: Negative for blurred vision.  Respiratory: Positive for shortness of breath (chronic).   Cardiovascular: Positive for palpitations (due to medication). Negative for chest pain and leg swelling.  Gastrointestinal: Positive for abdominal pain and constipation. Negative for blood in stool, diarrhea, heartburn, melena, nausea and vomiting.       S/p gastric bypass surgery.  Irritable bowel syndrome (IBS). Eating 3 meals daily.  Genitourinary: Negative for dysuria, frequency, hematuria and urgency.  Musculoskeletal: Positive for back pain. Negative for joint pain, myalgias and neck pain.       Fibromyalgia.  Skin: Negative for itching and rash.  Neurological: Positive for headaches. Negative for dizziness, tingling, sensory change and weakness.  Endo/Heme/Allergies: Bruises/bleeds easily (bruising on legs).  Psychiatric/Behavioral: Negative for depression and memory loss. The patient has insomnia. The patient is not nervous/anxious.   All other systems reviewed and are negative.   Performance status (ECOG): 1 - Symptomatic but completely ambulatory   Vitals There were no vitals taken for this visit.   Physical Exam  Constitutional: She is oriented to person, place, and time. She appears well-developed and well-nourished.  HENT:  Curly long hair. Mask.  Eyes:  Glasses.  Neurological: She is alert and oriented to person, place, and time.    Psychiatric: She has a normal mood and affect. Her behavior is normal. Judgment and thought content normal.  Nursing note reviewed.   No visits with results within 3 Day(s) from this visit.  Latest known visit with results is:  Hospital Outpatient Visit on 01/03/2020  Component Date Value Ref Range Status  . SURGICAL PATHOLOGY 01/03/2020    Final-Edited                   Value:SURGICAL PATHOLOGY CASE: [id number] PATIENT: Bruce Donath Surgical Pathology Report  Specimen Submitted: A. Breast, left  Clinical History: Mass  DIAGNOSIS: A. LEFT BREAST; BIOPSY: - FIBROCYSTIC CHANGES WITH PAPILLARY APOCRINE METAPLASIA. - NEGATIVE FOR ATYPIA AND MALIGNANCY.  GROSS DESCRIPTION: A. Labeled: Left breast 930 Received: In formalin Time/date in fixative: 8:34 AM on 01/03/2020 Cold ischemic time: Not provided Total fixation time: 9 hours Core pieces: 4 Size: 2.5 x 0.5 x 0.2 cm in aggregate Description: Pink-red to yellow cylindrically shaped fibrofatty tissue fragments admixed with blood clot Ink color: Blue Entirely submitted in 1 cassette.  Final Diagnosis performed by Betsy Pries, MD.   Electronically signed 01/04/2020 2:18:19PM The electronic signature indicates that the named Attending Pathologist has evaluated the specimen Technical component performed at [facility], [address], [place], [place] [address] Lab: [id number]                         [phone] Dir: Rush Farmer, MD, MMM  Professional component performed at [facility], [place], [place], [place] [address] Lab: [phone] Dir: Dellia Nims. Reuel Derby, MD    Assessment:  Angela [name] is a 44 y.o. 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 She is on Coumadin.  Work-up on 12/22/2018 included the following normal/negative labs:  CBC with diff, lupus anticoagulant, protein C activity, and protein S activity/antigen.Normal labson 01/06/2019 included: Factor V  Leiden, prothrombin gene mutation, anticardiolipin antibodies, and beta2-glycoprotein antibodies.  Chest CT angiogramon 12/20/2018 revealed a wedge-shaped opacity in the right lower lobelateral basal segment consistent with a pulmonary infarct. There was relative hypoenhancement within the associated segmental artery probablysecondary toa small pulmonary embolus, though it wasdifficult to assessgiven limitationsof beam attenuation caused by the patient's body habitus. There was a small right pleural effusion with associated atelectasis.  Bilateral lower extremity duplexon 12/21/2018 revealed no femoropopliteal and no calf DVT in the visualized calf veins.  AbdomenandpelvisCTon 01/16/2019 revealed no acute findings. There was a new 2 cm pleural-based nodule in posterior right lower lobe.  PET scanon 01/26/2019 revealed no hypermetabolic findings highly suspicious for a malignant process. 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  [facility] for [facility]osis and a lower GI bleed. CTA abdomen and pelvison 05/26/2019 showed scattered diverticulosis, large stool burden. Tagged RBCscanon 05/26/2019 showed no evidence of GI bleed. IVC filterwas placedon 05/29/2019. EGDand colonoscopyon 05/27/2019 showed no evidence of active or recent bleeding. The source of patient's bleeding was likely internal hemorrhoids.  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  She underwent embolization of the left inferior epigastric artery.   Bilateral mammogramandleft breast ultrasoundon 06/26/2019 revealed a probably benign,12 mm fibrocystic lesionat the 9:30  o'clock position, 7 cm the nipple. Ultrasound revealeda cystic and solid lesion with echogenic foci consistent with calcifications. The appearancewas consistent with a fibrocystic lesion.Left unilateral diagnostic mammogram and ultrasound on 12/26/2019 revealed indeterminate 1.3 cm x 1 x 1.2 cm mixed cystic and solid mass in the left breast at the 9:30 position 10 cm from the nipple.  Ultrasound-guided biopsy on 01/03/2020 revealed fibrocystic changes with papillary apocrine metaplasia.  There was no atypia or malignancy.  Symptomatically, she denies any excess bruising or bleeding.  INR has been difficult to manage secondary to transportation issues.  She has a device to check her INR at home.  Plan: 1.   Review interim labs. 2.Recurrent pulmonary embolism Patientwithpulmonary embolism in 2006and2020. Work-upwasnegative. PET scan revealed no malignancy. She has an IVC filter in place. She is on Coumadin. INR has been difficult to monitor secondary to transportation issues. GoalINR is 2-3.             Encourage close monitoring of IBR and adjustment in Coumadin.  No plan for Xarelto or Eliquis s/p gastric bypass surgery. 3.  S/p gastric sleeve surgery             Surgery was complicated by hemoperitoneum.             Check ferritin, iron studies, and B12 to ensure she does not become deficient. 4.Left breast s/p biopsy Pathology revealed papillary apocrine metaplasia.  No evidence of atypia or malignancy.  Lesion was concordant with imaging findings.  5.RTC per patient's schedule for MD assessment and labs (already in place).  I discussed the assessment and treatment plan with the patient.  The patient was provided an opportunity to ask questions and all were answered.  The patient agreed with the plan and demonstrated an understanding of the instructions.  The patient was  advised to call back if the symptoms worsen or if the condition fails to  improve as anticipated.  I provided 20 minutes (3:06 PM - 3:26 PM) of non-face-to-face video time during this encounter.  I provided these services from the [facility] office.    Lequita Asal, MD, PhD    01/15/2020, 3:26 PM  I, Jacqualyn Posey, am acting as a Education administrator for [employer]. Mike Gip, MD.   I, Kamee Bobst C. Mike Gip, MD, have reviewed the above documentation for accuracy and completeness, and I agree with the above.

## 2020-01-16 ENCOUNTER — Encounter: Payer: Self-pay | Admitting: Hematology and Oncology

## 2020-01-18 ENCOUNTER — Ambulatory Visit (INDEPENDENT_AMBULATORY_CARE_PROVIDER_SITE_OTHER): Payer: Medicare Other

## 2020-01-18 ENCOUNTER — Ambulatory Visit (INDEPENDENT_AMBULATORY_CARE_PROVIDER_SITE_OTHER): Payer: Medicare Other | Admitting: Vascular Surgery

## 2020-01-18 ENCOUNTER — Encounter (INDEPENDENT_AMBULATORY_CARE_PROVIDER_SITE_OTHER): Payer: Self-pay | Admitting: Vascular Surgery

## 2020-01-18 ENCOUNTER — Other Ambulatory Visit: Payer: Self-pay

## 2020-01-18 VITALS — BP 126/84 | HR 72 | Ht 63.0 in | Wt 267.0 lb

## 2020-01-18 DIAGNOSIS — I10 Essential (primary) hypertension: Secondary | ICD-10-CM

## 2020-01-18 DIAGNOSIS — R200 Anesthesia of skin: Secondary | ICD-10-CM | POA: Diagnosis not present

## 2020-01-18 DIAGNOSIS — E782 Mixed hyperlipidemia: Secondary | ICD-10-CM

## 2020-01-18 DIAGNOSIS — I2699 Other pulmonary embolism without acute cor pulmonale: Secondary | ICD-10-CM | POA: Diagnosis not present

## 2020-01-18 DIAGNOSIS — I890 Lymphedema, not elsewhere classified: Secondary | ICD-10-CM

## 2020-01-18 DIAGNOSIS — I872 Venous insufficiency (chronic) (peripheral): Secondary | ICD-10-CM

## 2020-01-28 ENCOUNTER — Encounter (INDEPENDENT_AMBULATORY_CARE_PROVIDER_SITE_OTHER): Payer: Self-pay | Admitting: Vascular Surgery

## 2020-01-28 NOTE — Progress Notes (Signed)
MRN : [id number]  Shailey Butterbaugh is a 44 y.o. (03/27/1976) female who presents with chief complaint of  Chief Complaint  Patient presents with  . Follow-up    U/S follow up  .  History of Present Illness:   The patient is seen for evaluation of painful lower extremities. Patient notes the pain is variable and not always associated with activity.  The pain is somewhat consistent day to day occurring on most days. The patient notes the pain also occurs with standing and routinely seems worse as the day wears on. The pain has been progressive over the past several years. The patient states these symptoms are causing  a profound negative impact on quality of life and daily activities.  The patient denies rest pain or dangling of an extremity off the side of the bed during the night for relief. No open wounds or sores at this time. No history of DVT or phlebitis. No prior interventions or surgeries.  There is a  history of back problems and DJD of the lumbar and sacral spine.   ABI's are normal bilaterally  Current Meds  Medication Sig  . acetaminophen (TYLENOL) 500 MG tablet Take 1,000-1,500 mg by mouth every 6 (six) hours as needed for moderate pain or headache.  . albuterol (PROVENTIL HFA;VENTOLIN HFA) 108 (90 BASE) MCG/ACT inhaler Inhale 2 puffs into the lungs every 6 (six) hours as needed for wheezing.  . Carboxymethylcellulose Sodium (REFRESH LIQUIGEL OP) Place 1 drop into both eyes daily as needed (dry eyes).  . Cholecalciferol 125 MCG (5000 UT) TABS Take by mouth.  . clindamycin (CLEOCIN T) 1 % lotion Apply 1 application topically 2 (two) times daily as needed (acne).  . cyclobenzaprine (FLEXERIL) 10 MG tablet Take 1 tablet (10 mg total) by mouth as needed.  . Difluprednate (DUREZOL) 0.05 % EMUL Place 1 drop into both eyes every 6 (six) hours as needed (uveitis).   Marland Kitchen diphenhydrAMINE (BENADRYL) 25 mg capsule Take 25 mg by mouth 2 (two) times daily as needed for allergies or  sleep.   Marland Kitchen enoxaparin (LOVENOX) 150 MG/ML injection Inject 0.9 mLs (135 mg total) into the skin every 12 (twelve) hours.  Marland Kitchen EPINEPHrine (EPIPEN 2-PAK) 0.3 mg/0.3 mL IJ SOAJ injection Inject 0.3 mg into the muscle as needed for anaphylaxis.   Marland Kitchen ergocalciferol (VITAMIN D2) 1.25 MG (50000 UT) capsule Take by mouth.  . fluticasone (FLONASE) 50 MCG/ACT nasal spray Place 1 spray into both nostrils daily as needed for rhinitis.  . Galcanezumab-gnlm 120 MG/ML SOAJ Inject 120 mg into the skin every 28 (twenty-eight) days.   Marland Kitchen levothyroxine (SYNTHROID) 150 MCG tablet Take 150 mcg by mouth daily before breakfast.   . linaclotide (LINZESS) 72 MCG capsule Take 72 mcg by mouth daily as needed (constipation).  Marland Kitchen loratadine (CLARITIN) 10 MG tablet Take 10 mg by mouth daily.  . Melatonin 3 MG CAPS Take 3 mg by mouth at bedtime as needed (sleep).  . Multiple Vitamin (MULTIVITAMIN WITH MINERALS) TABS tablet Take 1 tablet by mouth daily.  Marland Kitchen nystatin (MYCOSTATIN/NYSTOP) powder Apply topically.  . ondansetron (ZOFRAN) 8 MG tablet Take 8 mg by mouth every 8 (eight) hours as needed for nausea or vomiting.  Marland Kitchen oxybutynin (DITROPAN) 5 MG tablet Take 5 mg by mouth at bedtime.  . pantoprazole (PROTONIX) 40 MG tablet Take 40 mg by mouth daily.   . polyethylene glycol (MIRALAX / GLYCOLAX) 17 g packet PLEASE SEE ATTACHED FOR DETAILED DIRECTIONS  . prednisoLONE acetate (  PRED FORTE) 1 % ophthalmic suspension Place 1 drop into both eyes every 4 (four) hours as needed (uveitis).   . promethazine (PHENERGAN) 25 MG tablet Take 25 mg by mouth every 6 (six) hours as needed for nausea or vomiting.  . rosuvastatin (CRESTOR) 20 MG tablet Take 20 mg by mouth daily.  . sodium fluoride (DENTAGEL) 1.1 % GEL dental gel Place 1 application onto teeth at bedtime.   Marland Kitchen tiZANidine (ZANAFLEX) 4 MG tablet Take 4 mg by mouth every 12 (twelve) hours as needed for muscle spasms.  Marland Kitchen topiramate (TOPAMAX) 100 MG tablet Take 100 mg by mouth 2 (two) times  daily.  Marland Kitchen topiramate (TOPAMAX) 50 MG tablet Take 50 mg by mouth 2 (two) times daily.  . traMADol (ULTRAM) 50 MG tablet Take 1 tablet (50 mg total) by mouth every 6 (six) hours as needed for moderate pain.  Marland Kitchen warfarin (COUMADIN) 3 MG tablet Take by mouth.    Past Medical History:  Diagnosis Date  . Anemia   . Anxiety   . Asthma   . Depression   . Fibromyalgia   . GERD (gastroesophageal reflux disease)   . Hypertension   . Irritable bowel syndrome (IBS)   . Pseudotumor cerebri   . Pulmonary embolism ([place])   . Raynaud's disease   . Thyroid disease    hypothyroid  . Vision disturbance     Past Surgical History:  Procedure Laterality Date  . ABDOMINAL HYSTERECTOMY  02/06/13  . BREAST BIOPSY Right October, 2014   right breast core biopsy, fibroadenomatous changes  . BREAST BIOPSY Right December 2014   FNA retroareolar nodule consistent with fibroadenoma.  Marland Kitchen BREAST BIOPSY Right 12/16/2016   Fibroadenoma in the retroareolar area at 6:00.  Marland Kitchen [place]  . COLONOSCOPY  2015   Dr Allen Norris  . COLONOSCOPY WITH PROPOFOL N/A 05/27/2019   Procedure: COLONOSCOPY WITH PROPOFOL;  Surgeon: Virgel Manifold, MD;  Location: [facility];  Service: Endoscopy;  Laterality: N/A;  . EMBOLIZATION N/A 10/31/2019   Procedure: EMBOLIZATION;  Surgeon: Algernon Huxley, MD;  Location: [place] [facility];  Service: Cardiovascular;  Laterality: N/A;  . ESOPHAGOGASTRODUODENOSCOPY (EGD) WITH PROPOFOL N/A 05/27/2019   Procedure: ESOPHAGOGASTRODUODENOSCOPY (EGD) WITH PROPOFOL;  Surgeon: Virgel Manifold, MD;  Location: [facility] END[facility]COPY;  Service: Endoscopy;  Laterality: N/A;  . IVC FILTER INSERTION N/A 05/29/2019   Procedure: IVC FILTER INSERTION;  Surgeon: Algernon Huxley, MD;  Location: [place] [facility];  Service: Cardiovascular;  Laterality: N/A;  . RHINOPLASTY    . TONSILECTOMY, ADENOIDECTOMY, BILATERAL MYRINGOTOMY AND TUBES  2004  . UPPER GASTROINTESTINAL ENDOSCOPY  2015   Dr  Allen Norris    Social History Social History   Tobacco Use  . Smoking status: Never Smoker  . Smokeless tobacco: Never Used  Vaping Use  . Vaping Use: Never used  Substance Use Topics  . Alcohol use: Yes    Comment: Occasionally Social Drinker  . Drug use: No    Family History Family History  Problem Relation Age of Onset  . Depression Mother   . Migraines Mother   . Diverticulitis Mother   . Hypertension Mother   . Heart disease Father   . Diabetes Father   . Hypertension Father   . Stroke Sister   . Cancer Maternal Aunt   . Breast cancer Maternal Aunt   . Cancer Maternal Grandmother     Allergies  Allergen Reactions  . Bee Venom Other (See Comments),  Nausea Only, Rash and Shortness Of Breath  . Bupropion Other (See Comments)  . Lisinopril Cough  . No Known Allergies   . Amoxicillin Hives    Did it involve swelling of the face/tongue/throat, SOB, or low BP? No Did it involve sudden or severe rash/hives, skin peeling, or any reaction on the inside of your mouth or nose? No Did you need to seek medical attention at a hospital or doctor's office? Unknown When did it last happen?5+ years If all above answers are "NO", may proceed with cephalosporin use.   Marcelline Mates     Break out in mouth  . Nortriptyline     Unknown reaction   . Orange Fruit [Citrus]     Break out in mouth  . Other     Sulfa eye drops- Scratched corneas  . Penicillins Hives    Did it involve swelling of the face/tongue/throat, SOB, or low BP? No Did it involve sudden or severe rash/hives, skin peeling, or any reaction on the inside of your mouth or nose? No Did you need to seek medical attention at a hospital or doctor's office? Unknown When did it last happen?5+ years If all above answers are "NO", may proceed with cephalosporin use.   . Milnacipran Palpitations     REVIEW OF SYSTEMS (Negative unless checked)  Constitutional: _0 Weight loss  _1 Fever  _2 Chills Cardiac: _3 Chest pain    _4 Chest pressure   _5 Palpitations   _6 Shortness of breath when laying flat   _7 Shortness of breath with exertion. Vascular:  _8 Pain in legs with walking   _9 Pain in legs at rest  _10 History of DVT   _11 Phlebitis   _12 Swelling in legs   _13 Varicose veins   _14 Non-healing ulcers Pulmonary:   _15 Uses home oxygen   _16 Productive cough   _17 Hemoptysis   _18 Wheeze  _19 COPD   _20 Asthma Neurologic:  _21 Dizziness   _22 Seizures   _23 History of stroke   _24 History of TIA  _25 Aphasia   _26 Vissual changes   _27 Weakness or numbness in arm   _28 Weakness or numbness in leg Musculoskeletal:   _29 Joint swelling   _30 Joint pain   _31 Low back pain Hematologic:  _32 Easy bruising  _33 Easy bleeding   _34 Hypercoagulable state   _35 Anemic Gastrointestinal:  _36 Diarrhea   _37 Vomiting  _38 Gastroesophageal reflux/heartburn   _39 Difficulty swallowing. Genitourinary:  _40 Chronic kidney disease   _41 Difficult urination  _42 Frequent urination   _43 Blood in urine Skin:  _44 Rashes   _45 Ulcers  Psychological:  _46 History of anxiety   _47  History of major depression.  Physical Examination  Vitals:   01/18/20 1628  BP: 126/84  Pulse: 72  Weight: 267 lb (121.1 kg)  Height: _48  (1.6 m)   Body mass index is 47.3 kg/m. Gen: WD/WN, NAD Head: [place]/AT, No temporalis wasting.  Ear/Nose/Throat: Hearing grossly intact, nares w/o erythema or drainage Eyes: PER, EOMI, sclera nonicteric.  Neck: Supple, no large masses.   Pulmonary:  Good air movement, no audible wheezing bilaterally, no use of accessory muscles.  Cardiac: RRR, no JVD Vascular: scattered varicosities present bilaterally.  Mild venous stasis changes to the legs bilaterally.  2+ soft pitting edema Vessel Right Left  Radial Palpable Palpable  Gastrointestinal: Non-distended. No guarding/no peritoneal signs.  Musculoskeletal: M/S 5/5 throughout.  No deformity or atrophy.  Neurologic: CN 2-12 intact. Symmetrical.  Speech is fluent. Motor exam as listed above. Psychiatric: Judgment intact, Mood & affect  appropriate for pt's clinical situation. Dermatologic: No rashes or ulcers noted.  No changes consistent with cellulitis. Lymph : No lichenification  mild skin changes of chronic lymphedema.  CBC Lab Results  Component Value Date   WBC 19.2 (H) 10/31/2019   HGB 9.9 (L) 10/31/2019   HCT 29.5 (L) 10/31/2019   MCV 88.1 10/31/2019   PLT 237 10/31/2019    BMET    Component Value Date/Time   NA 142 10/31/2019 1757   NA 140 02/07/2013 0901   K 4.3 10/31/2019 1757   K 3.7 02/07/2013 0901   CL 113 (H) 10/31/2019 1757   CL 110 (H) 02/07/2013 0901   CO2 20 (L) 10/31/2019 1757   CO2 25 02/07/2013 0901   GLUCOSE 151 (H) 10/31/2019 1757   GLUCOSE 125 (H) 02/07/2013 0901   BUN 24 (H) 10/31/2019 1757   BUN 7 02/07/2013 0901   CREATININE 1.63 (H) 10/31/2019 1757   CREATININE 1.01 02/07/2013 0901   CALCIUM 7.7 (L) 10/31/2019 1757   CALCIUM 8.7 02/07/2013 0901   GFRNONAA 38 (L) 10/31/2019 1757   GFRNONAA >60 02/07/2013 0901   GFRAA 44 (L) 10/31/2019 1757   GFRAA >60 02/07/2013 0901   CrCl cannot be calculated (Patient's most recent lab result is older than the maximum 21 days allowed.).  COAG Lab Results  Component Value Date   INR 1.7 (H) 10/31/2019   INR 1.5 (H) 10/31/2019   INR 4.0 (H) 10/31/2019    Radiology VAS [place] ABI WITH/WO TBI  Result Date: 01/18/2020 LOWER EXTREMITY DOPPLER STUDY Other Factors: Numbness in toes.  Performing Technologist: Almira Coaster RVS  Examination Guidelines: A complete evaluation includes at minimum, Doppler waveform signals and systolic blood pressure reading at the level of bilateral brachial, anterior tibial, and posterior tibial arteries, when vessel segments are accessible. Bilateral testing is considered an integral part of a complete examination. Photoelectric Plethysmograph (PPG) waveforms and toe systolic pressure readings are included as required and additional duplex testing as needed. Limited examinations for reoccurring indications may be  performed as noted.  ABI Findings: +---------+------------------+-----+---------+--------+ Right    Rt Pressure (mmHg)IndexWaveform Comment  +---------+------------------+-----+---------+--------+ Brachial 149                                      +---------+------------------+-----+---------+--------+ ATA      137               0.92 triphasic         +---------+------------------+-----+---------+--------+ PTA      163               1.09 triphasic         +---------+------------------+-----+---------+--------+ Great Toe153               1.03 Normal            +---------+------------------+-----+---------+--------+ +---------+------------------+-----+---------+-------+ Left     Lt Pressure (mmHg)IndexWaveform Comment +---------+------------------+-----+---------+-------+ Brachial 141                                     +---------+------------------+-----+---------+-------+ ATA      135               0.91 triphasic        +---------+------------------+-----+---------+-------+ PTA      158               1.06 triphasic        +---------+------------------+-----+---------+-------+ [place] Patter  0.89 Normal           +---------+------------------+-----+---------+-------+ +-------+-----------+-----------+------------+------------+ ABI/TBIToday's ABIToday's TBIPrevious ABIPrevious TBI +-------+-----------+-----------+------------+------------+ Right  1.09       1.03                                +-------+-----------+-----------+------------+------------+ Left   1.06       .89                                 +-------+-----------+-----------+------------+------------+  Summary: Right: Resting right ankle-brachial index is within normal range. No evidence of significant right lower extremity arterial disease. The right toe-brachial index is normal. Left: Resting left ankle-brachial index is within normal range. No evidence of significant  left lower extremity arterial disease. The left toe-brachial index is normal.  *See table(s) above for measurements and observations.  Electronically signed by Hortencia Pilar MD on 01/18/2020 at 5:52:52 PM.   Final    MM CLIP PLACEMENT LEFT  Result Date: 01/03/2020 CLINICAL DATA:  Evaluate biopsy marker EXAM: DIAGNOSTIC LEFT MAMMOGRAM POST ULTRASOUND BIOPSY COMPARISON:  Previous exam(s). FINDINGS: Mammographic images were obtained following ultrasound guided biopsy of a 930 left breast mass. The biopsy marking clip is in expected position at the site of biopsy. IMPRESSION: Appropriate positioning of the coil shaped biopsy marking clip at the site of biopsy in the left breast immediately adjacent to the biopsied mass. Final Assessment: Post Procedure Mammograms for Marker Placement Electronically Signed   By: Dorise Bullion III M.D   On: 01/03/2020 08:48   [place] LT BREAST BX W LOC DEV 1ST LESION IMG BX SPEC US GUIDE  Addendum Date: 01/09/2020   ADDENDUM REPORT: 01/05/2020 09:55 ADDENDUM: PATHOLOGY revealed: EFT BREAST; BIOPSY: - FIBROCYSTIC CHANGES WITH PAPILLARY APOCRINE METAPLASIA. - NEGATIVE FOR ATYPIA AND MALIGNANCY. Pathology results are CONCORDANT with imaging findings, per Dr. Dorise Bullion. Pathology results and recommendations below were discussed with patient by telephone on 01/04/2020. Patient reported biopsy site doing well with slight tenderness at the site. Post biopsy care instructions were reviewed and questions were answered. Patient was instructed to call [facility] if any concerns or questions arise related to the biopsy. Recommendation: Return to annual bilateral screening mammogram, due in November 2021. Addendum by Electa Sniff RN on 01/05/2020. Electronically Signed   By: Dorise Bullion III M.D   On: 01/05/2020 09:55   Result Date: 01/09/2020 CLINICAL DATA:  Biopsy of a left breast mass EXAM: ULTRASOUND GUIDED LEFT BREAST CORE NEEDLE BIOPSY COMPARISON:  Previous exam(s).  PROCEDURE: I met with the patient and we discussed the procedure of ultrasound-guided biopsy, including benefits and alternatives. We discussed the high likelihood of a successful procedure. We discussed the risks of the procedure, including infection, bleeding, tissue injury, clip migration, and inadequate sampling. Informed written consent was given. The usual time-out protocol was performed immediately prior to the procedure. Lesion quadrant: 930, 10 cm from the nipple Using sterile technique and 1% Lidocaine as local anesthetic, under direct ultrasound visualization, a 12 gauge spring-loaded device was used to perform biopsy of a 930 left breast mass using a medial approach. At the conclusion of the procedure tissue marker clip was deployed into the biopsy cavity. Follow up 2 view mammogram was performed and dictated separately. IMPRESSION: Ultrasound guided biopsy of a 930 left breast mass. No apparent complications. Electronically Signed: By: Dorise Bullion III M.D  On: 01/03/2020 08:41     Assessment/Plan 1. Chronic venous insufficiency No surgery or intervention at this point in time.    I have had a long discussion with the patient regarding venous insufficiency and why it  causes symptoms. I have discussed with the patient the chronic skin changes that accompany venous insufficiency and the long term sequela such as infection and ulceration.  Patient will begin wearing graduated compression stockings class 1 (20-30 mmHg) or compression wraps on a daily basis a prescription was given. The patient will put the stockings on first thing in the morning and removing them in the evening. The patient is instructed specifically not to sleep in the stockings.    In addition, behavioral modification including several periods of elevation of the lower extremities during the day will be continued. I have demonstrated that proper elevation is a position with the ankles at heart level.  The patient is  instructed to begin routine exercise, especially walking on a daily basis  Following the review of the ultrasound the patient will follow up in 4 months to reassess the degree of swelling and the control that graduated compression stockings or compression wraps  is offering.   The patient can be assessed for a Lymph Pump at that time - VAS US AORTA/IVC/ILIACS; Future  2. 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 Essential (primary) hypertension Continue antihypertensive medications as already ordered, these medications have been reviewed and there are no changes at this time.   4. Mixed hyperlipidemia Continue statin as ordered and reviewed, no changes at this time   5. Lymphedema See #1    Hortencia Pilar, MD  01/28/2020 12:07 PM

## 2020-02-07 ENCOUNTER — Encounter (INDEPENDENT_AMBULATORY_CARE_PROVIDER_SITE_OTHER): Payer: Self-pay

## 2020-02-08 ENCOUNTER — Other Ambulatory Visit (INDEPENDENT_AMBULATORY_CARE_PROVIDER_SITE_OTHER): Payer: Self-pay | Admitting: Vascular Surgery

## 2020-02-08 DIAGNOSIS — I2699 Other pulmonary embolism without acute cor pulmonale: Secondary | ICD-10-CM

## 2020-02-08 MED ORDER — ENOXAPARIN SODIUM 150 MG/ML ~~LOC~~ SOLN: 1.0000 mg/kg | Freq: Two times a day (BID) | SUBCUTANEOUS | 0 refills | Status: DC

## 2020-05-13 ENCOUNTER — Encounter (INDEPENDENT_AMBULATORY_CARE_PROVIDER_SITE_OTHER): Payer: Medicare Other

## 2020-05-13 ENCOUNTER — Encounter (INDEPENDENT_AMBULATORY_CARE_PROVIDER_SITE_OTHER): Payer: Self-pay

## 2020-05-13 ENCOUNTER — Ambulatory Visit (INDEPENDENT_AMBULATORY_CARE_PROVIDER_SITE_OTHER): Payer: Medicare Other | Admitting: Nurse Practitioner

## 2020-06-10 ENCOUNTER — Encounter: Payer: Self-pay | Admitting: Hematology and Oncology

## 2020-06-12 ENCOUNTER — Other Ambulatory Visit: Payer: Self-pay

## 2020-06-12 DIAGNOSIS — I2699 Other pulmonary embolism without acute cor pulmonale: Secondary | ICD-10-CM

## 2020-06-12 DIAGNOSIS — K661 Hemoperitoneum: Secondary | ICD-10-CM

## 2020-06-12 DIAGNOSIS — Z903 Acquired absence of stomach [part of]: Secondary | ICD-10-CM

## 2020-06-12 DIAGNOSIS — Z9884 Bariatric surgery status: Secondary | ICD-10-CM

## 2020-06-12 DIAGNOSIS — K921 Melena: Secondary | ICD-10-CM

## 2020-06-17 ENCOUNTER — Other Ambulatory Visit: Payer: Medicare Other

## 2020-06-17 ENCOUNTER — Ambulatory Visit: Payer: Medicare Other | Admitting: Hematology and Oncology

## 2020-06-18 ENCOUNTER — Other Ambulatory Visit: Payer: Self-pay | Admitting: Internal Medicine

## 2020-06-20 NOTE — Progress Notes (Signed)
[facility]  [address] [place], [place] [address] Phone: [phone]  Fax: [phone]   Telemedicine Office Visit:  06/24/2020  Referring physician: Sharyne Peach, MD  I connected with Angela [name] on 06/24/2020 at 1:35 PM by videoconferencing and verified that I was speaking with the correct person using 2 identifiers.  The patient was at home.  I discussed the limitations, risk, security and privacy concerns of performing an evaluation and management service by videoconferencing and the availability of in person appointments.  I also discussed with the patient that there may be a patient responsible charge related to this service.  The patient expressed understanding and agreed to proceed.   Chief Complaint: Angela [name] is a 44 y.o. female s/p gastric bypass surgery and a history of recurrent pulmonary embolism who is seen for 6 week assessment.   HPI: The patient was last seen in the hematology clinic on 01/15/2020 for a telemedicine visit. At that time, she denied any excess bruising or bleeding.  INR had been difficult to manage secondary to transportation issues.  She had a device to check her INR at home.  [facility] labs on 06/10/2020 revealed a hematocrit of 37.7, hemoglobin 12.8, platelets 285,000, WBC 8,400.  Ferritin was 223 with an iron saturation of 50% and a TIBC of 230.  During the interim, she has been "ok". She does not feel well today; she has been fatigued x 2 weeks and has been dizzy for several days. She spoke to Dr. Tressia Miners this morning who felt like she was having a fibromyalgia flare.  The patient sometimes does not sleep at night and sometimes sleeps all day. She has tried to take melatonin but it does not always work. She bruises easily on her legs and stomach. She reports leg pain and "knots." Her feet swell on and off. She has shortness of breath due to asthma. Her palpitations and sweats are stable.  She  tests her INR at home one or twice per week. Her INR has been followed: 0.9 on 05/24/2020, 0.8 on 05/25/2020, 1.7 on 05/31/2020, 3.1 on 06/06/2020, and 2.2 on 06/19/2020. She alternates Coumadin 10 mg and 7.5 mg. During the time when her INR was very low, she was taking a lot of Tylenol and cold medicine.  She has had a lot of stress in her personal life lately. She is dealing with some family problems and her grandmother passed in 05/2020. Because of this, she does not have an appetite. She states that the sizes of her clothes has decreased but her weight has plateaued.   Past Medical History:  Diagnosis Date  . Anemia   . Anxiety   . Asthma   . Depression   . Fibromyalgia   . GERD (gastroesophageal reflux disease)   . Hypertension   . Irritable bowel syndrome (IBS)   . Pseudotumor cerebri   . Pulmonary embolism ([place])   . Raynaud's disease   . Thyroid disease    hypothyroid  . Vision disturbance     Past Surgical History:  Procedure Laterality Date  . ABDOMINAL HYSTERECTOMY  02/06/13  . BREAST BIOPSY Right October, 2014   right breast core biopsy, fibroadenomatous changes  . BREAST BIOPSY Right December 2014   FNA retroareolar nodule consistent with fibroadenoma.  Marland Kitchen BREAST BIOPSY Right 12/16/2016   Fibroadenoma in the retroareolar area at 6:00.  Marland Kitchen [place]  . COLONOSCOPY  2015   Dr Allen Norris  .  COLONOSCOPY WITH PROPOFOL N/A 05/27/2019   Procedure: COLONOSCOPY WITH PROPOFOL;  Surgeon: Virgel Manifold, MD;  Location: [facility] ENDOSCOPY;  Service: Endoscopy;  Laterality: N/A;  . EMBOLIZATION N/A 10/31/2019   Procedure: EMBOLIZATION;  Surgeon: Algernon Huxley, MD;  Location: [facility];  Service: Cardiovascular;  Laterality: N/A;  . ESOPHAGOGASTRODUODENOSCOPY (EGD) WITH PROPOFOL N/A 05/27/2019   Procedure: ESOPHAGOGASTRODUODENOSCOPY (EGD) WITH PROPOFOL;  Surgeon: Virgel Manifold, MD;  Location: [facility] ENDOSCOPY;  Service: Endoscopy;  Laterality: N/A;   . IVC FILTER INSERTION N/A 05/29/2019   Procedure: IVC FILTER INSERTION;  Surgeon: Algernon Huxley, MD;  Location: [facility];  Service: Cardiovascular;  Laterality: N/A;  . RHINOPLASTY    . TONSILECTOMY, ADENOIDECTOMY, BILATERAL MYRINGOTOMY AND TUBES  2004  . UPPER GASTROINTESTINAL ENDOSCOPY  2015   Dr Allen Norris    Family History  Problem Relation Age of Onset  . Depression Mother   . Migraines Mother   . Diverticulitis Mother   . Hypertension Mother   . Heart disease Father   . Diabetes Father   . Hypertension Father   . Stroke Sister   . Cancer Maternal Aunt   . Breast cancer Maternal Aunt   . Cancer Maternal Grandmother     Social History:  reports that she has never smoked. She has never used smokeless tobacco. She reports current alcohol use. She reports that she does not use drugs. She drinks alcohol occasionally, in social settings. She has 3 children and 3 grandchildren.She lives in [place]. The patient is alone today.  Participants in the patient's visit and their role in the encounter included the patient and Vito Berger, CMA, today.  The intake visit was provided by Vito Berger, CMA.  Allergies:  Allergies  Allergen Reactions  . Bee Venom Other (See Comments), Nausea Only, Rash and Shortness Of Breath  . Bupropion Other (See Comments)  . Lisinopril Cough  . No Known Allergies   . Amoxicillin Hives    Did it involve swelling of the face/tongue/throat, SOB, or low BP? No Did it involve sudden or severe rash/hives, skin peeling, or any reaction on the inside of your mouth or nose? No Did you need to seek medical attention at a hospital or doctor's office? Unknown When did it last happen?5+ years If all above answers are "NO", may proceed with cephalosporin use.   Marcelline Mates     Break out in mouth  . Nortriptyline     Unknown reaction   . Orange Fruit [Citrus]     Break out in mouth  . Other     Sulfa eye drops- Scratched corneas  .  Penicillins Hives    Did it involve swelling of the face/tongue/throat, SOB, or low BP? No Did it involve sudden or severe rash/hives, skin peeling, or any reaction on the inside of your mouth or nose? No Did you need to seek medical attention at a hospital or doctor's office? Unknown When did it last happen?5+ years If all above answers are "NO", may proceed with cephalosporin use.   . Milnacipran Palpitations    Current Medications: Current Outpatient Medications  Medication Sig Dispense Refill  . acetaminophen (TYLENOL) 500 MG tablet Take 1,000-1,500 mg by mouth every 6 (six) hours as needed for moderate pain or headache.    . albuterol (PROVENTIL HFA;VENTOLIN HFA) 108 (90 BASE) MCG/ACT inhaler Inhale 2 puffs into the lungs every 6 (six) hours as needed for wheezing.    . Carboxymethylcellulose Sodium (REFRESH LIQUIGEL  OP) Place 1 drop into both eyes daily as needed (dry eyes).    . Cholecalciferol 125 MCG (5000 UT) TABS Take by mouth.    . clindamycin (CLEOCIN T) 1 % lotion Apply 1 application topically 2 (two) times daily as needed (acne).    . cyclobenzaprine (FLEXERIL) 10 MG tablet Take 1 tablet (10 mg total) by mouth as needed. 30 tablet 3  . Difluprednate (DUREZOL) 0.05 % EMUL Place 1 drop into both eyes every 6 (six) hours as needed (uveitis).     Marland Kitchen diphenhydrAMINE (BENADRYL) 25 mg capsule Take 25 mg by mouth 2 (two) times daily as needed for allergies or sleep.     Marland Kitchen enoxaparin (LOVENOX) 150 MG/ML injection Inject 0.8 mLs (120 mg total) into the skin every 12 (twelve) hours for 2 doses. Inject 0.8 Ml subcutaneously before long trips/travel (>4 hours) repeat for the return trip prn 1.6 mL 0  . EPINEPHrine (EPIPEN 2-PAK) 0.3 mg/0.3 mL IJ SOAJ injection Inject 0.3 mg into the muscle as needed for anaphylaxis.     Marland Kitchen ergocalciferol (VITAMIN D2) 1.25 MG (50000 UT) capsule Take by mouth.    . fluticasone (FLONASE) 50 MCG/ACT nasal spray Place 1 spray into both nostrils daily as  needed for rhinitis.    . Galcanezumab-gnlm 120 MG/ML SOAJ Inject 120 mg into the skin every 28 (twenty-eight) days.     Marland Kitchen levothyroxine (SYNTHROID) 150 MCG tablet Take 150 mcg by mouth daily before breakfast.     . linaclotide (LINZESS) 72 MCG capsule Take 72 mcg by mouth daily as needed (constipation).    Marland Kitchen loratadine (CLARITIN) 10 MG tablet Take 10 mg by mouth daily.    . Melatonin 3 MG CAPS Take 3 mg by mouth at bedtime as needed (sleep).    . Multiple Vitamin (MULTIVITAMIN WITH MINERALS) TABS tablet Take 1 tablet by mouth daily.    Marland Kitchen nystatin (MYCOSTATIN/NYSTOP) powder Apply topically.    . ondansetron (ZOFRAN) 8 MG tablet Take 8 mg by mouth every 8 (eight) hours as needed for nausea or vomiting.    Marland Kitchen oxybutynin (DITROPAN) 5 MG tablet Take 5 mg by mouth at bedtime.    . polyethylene glycol (MIRALAX / GLYCOLAX) 17 g packet PLEASE SEE ATTACHED FOR DETAILED DIRECTIONS    . prednisoLONE acetate (PRED FORTE) 1 % ophthalmic suspension Place 1 drop into both eyes every 4 (four) hours as needed (uveitis).     . promethazine (PHENERGAN) 25 MG tablet Take 25 mg by mouth every 6 (six) hours as needed for nausea or vomiting.    . rosuvastatin (CRESTOR) 20 MG tablet Take 20 mg by mouth daily.    . sodium fluoride (DENTAGEL) 1.1 % GEL dental gel Place 1 application onto teeth at bedtime.     Marland Kitchen tiZANidine (ZANAFLEX) 4 MG tablet Take 4 mg by mouth every 12 (twelve) hours as needed for muscle spasms.    Marland Kitchen topiramate (TOPAMAX) 100 MG tablet Take 100 mg by mouth 2 (two) times daily.    Marland Kitchen topiramate (TOPAMAX) 50 MG tablet Take 50 mg by mouth 2 (two) times daily.    . traMADol (ULTRAM) 50 MG tablet Take 1 tablet (50 mg total) by mouth every 6 (six) hours as needed for moderate pain. 12 tablet 0  . warfarin (COUMADIN) 3 MG tablet Take by mouth.     No current facility-administered medications for this visit.    Review of Systems  Constitutional: Positive for diaphoresis (occasional), malaise/fatigue (x 2  weeks) and  weight loss (fluctuates). Negative for chills and fever.  HENT: Negative for congestion, ear discharge, ear pain, hearing loss, nosebleeds, sinus pain, sore throat and tinnitus.   Eyes: Negative for blurred vision.  Respiratory: Positive for shortness of breath (chronic). Negative for cough, hemoptysis and sputum production.   Cardiovascular: Positive for palpitations (due to medication) and leg swelling (feet, on and off). Negative for chest pain.  Gastrointestinal: Negative for abdominal pain, blood in stool, constipation, diarrhea, heartburn, melena, nausea and vomiting.       S/p gastric bypass surgery.  Irritable bowel syndrome (IBS). Poor appetite.  Genitourinary: Negative for dysuria, frequency, hematuria and urgency.  Musculoskeletal: Positive for back pain and myalgias (leg pain). Negative for joint pain and neck pain.       Fibromyalgia.  Skin: Negative for itching and rash.  Neurological: Positive for dizziness (for several days). Negative for tingling, sensory change, weakness and headaches.  Endo/Heme/Allergies: Bruises/bleeds easily (bruising on legs).  Psychiatric/Behavioral: Negative for depression and memory loss. The patient has insomnia. The patient is not nervous/anxious.        Stress.  All other systems reviewed and are negative.   Performance status (ECOG):  1  Vitals There were no vitals taken for this visit.   Physical Exam Nursing note reviewed.  Constitutional:      Appearance: She is well-developed.  HENT:     Head:     Comments: Long curly black hair. Eyes:     Comments: Glasses.  Neurological:     Mental Status: She is alert and oriented to person, place, and time.  Psychiatric:        Behavior: Behavior normal.        Thought Content: Thought content normal.        Judgment: Judgment normal.    No visits with results within 3 Day(s) from this visit.  Latest known visit with results is:  Hospital Outpatient Visit on 01/03/2020   Component Date Value Ref Range Status  . SURGICAL PATHOLOGY 01/03/2020    Final-Edited                   Value:SURGICAL PATHOLOGY CASE: [id number] PATIENT: Angela [name] Surgical Pathology Report  Specimen Submitted: A. Breast, left  Clinical History: Mass  DIAGNOSIS: A. LEFT BREAST; BIOPSY: - FIBROCYSTIC CHANGES WITH PAPILLARY APOCRINE METAPLASIA. - NEGATIVE FOR ATYPIA AND MALIGNANCY.  GROSS DESCRIPTION: A. Labeled: Left breast 930 Received: In formalin Time/date in fixative: 8:34 AM on 01/03/2020 Cold ischemic time: Not provided Total fixation time: 9 hours Core pieces: 4 Size: 2.5 x 0.5 x 0.2 cm in aggregate Description: Pink-red to yellow cylindrically shaped fibrofatty tissue fragments admixed with blood clot Ink color: Blue Entirely submitted in 1 cassette.  Final Diagnosis performed by Betsy Pries, MD.   Electronically signed 01/04/2020 2:18:19PM The electronic signature indicates that the named Attending Pathologist has evaluated the specimen Technical component performed at [facility], [address] [place], [place] [address] Lab: [phone]                         [phone] Dir: Rush Farmer, MD, MMM  Professional component performed at [facility], [facility], [place], [place],  [address] Lab: [phone] Dir: Dellia Nims. Reuel Derby, MD    Assessment:  Angela [name] is a 44 y.o. 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 She is on Coumadin.  Work-up on 12/22/2018 included the following normal/negative labs: CBC with diff, lupus anticoagulant, protein  C activity, and protein S activity/antigen.Normal labson 01/06/2019 included: Factor V Leiden, prothrombin gene mutation, anticardiolipin antibodies, and beta2-glycoprotein antibodies.  Chest CT angiogramon 12/20/2018 revealed a wedge-shaped opacity in the right lower lobelateral basal segment consistent with a pulmonary infarct. There was  relative hypoenhancement within the associated segmental artery probablysecondary toa small pulmonary embolus, though it wasdifficult to assessgiven limitationsof beam attenuation caused by the patient's body habitus. There was a small right pleural effusion with associated atelectasis.  Bilateral lower extremity duplexon 12/21/2018 revealed no femoropopliteal and no calf DVT in the visualized calf veins.  AbdomenandpelvisCTon 01/16/2019 revealed no acute findings. There was a new 2 cm pleural-based nodule in posterior right lower lobe.  PET scanon 01/26/2019 revealed no hypermetabolic findings highly suspicious for a malignant process. 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  [facility] for [facility]osis and a lower GI bleed. CTA abdomen and pelvison 05/26/2019 showed scattered diverticulosis, large stool burden. Tagged RBCscanon 05/26/2019 showed no evidence of GI bleed. IVC filterwas placedon 05/29/2019. EGDand colonoscopyon 05/27/2019 showed no evidence of active or recent bleeding. The source of patient's bleeding was likely internal hemorrhoids.  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  She underwent embolization of the left inferior epigastric artery.   Bilateral mammogramandleft breast ultrasoundon 06/26/2019 revealed a probably benign,12 mm fibrocystic lesionat the 9:30 o'clock position, 7 cm the nipple. Ultrasound revealeda cystic and solid lesion with echogenic foci consistent with calcifications. The appearancewas consistent with a fibrocystic lesion.Left unilateral diagnostic mammogram and ultrasound on 12/26/2019  revealed indeterminate 1.3 cm x 1 x 1.2 cm mixed cystic and solid mass in the left breast at the 9:30 position 10 cm from the nipple.  Ultrasound-guided biopsy on 01/03/2020 revealed fibrocystic changes with papillary apocrine metaplasia.  There was no atypia or malignancy.  Symptomatically, she has been "ok". She has been fatigued x 2 weeks.  She reports leg pain and "knots." Her feet swell on and off.  INR testing at home fluctuates.  She alternates Coumadin 10 mg and 7.5 mg.  She does not have an appetite; the sizes of her clothes has decreased but her weight has plateaued.  Plan: 1.   Review interval labs. 2.Recurrent pulmonary embolism Patientwithpulmonary embolism in 2006and2020.  Hypercoagulable work-up was negative.  PET scan revealed no evidence of malignancy She has an IVC filter in place. She is on chronic Coumadin. INR is monitored at home secondary to transportation issues.  Goal INR is 2-3.             Discuss plan to continue close monitoring of INR by primary care physician.  No plan for Xarelto or Eliquis s/p gastric bypass surgery. 3.  S/p gastric sleeve surgery             Discuss importance of monitoring ferritin and B12 and likely need for future supplementation.  Most patient s/p bariatric surgery require IV iron and many require B12 injections. 4.RTC prn.  I discussed the assessment and treatment plan with the patient.  The patient was provided an opportunity to ask questions and all were answered.  The patient agreed with the plan and demonstrated an understanding of the instructions.  The patient was advised to call back if the symptoms worsen or if the condition fails to improve as anticipated.  I provided 17 minutes (1:35 PM - 1:52 PM) of face-to-face video visit time during this this encounter and > 50% was spent counseling as documented under my assessment and plan.  An  additional 5 minutes were spent reviewing her chart (Epic and Care Everywhere) including notes, labs, and imaging studies.  I provided these services from the [facility] office.   Lequita Asal, MD, PhD    06/24/2020, 1:35 PM  I, Mirian Mo Tufford, am acting as a Education administrator for [employer]. Mike Gip, MD.   I, Cailean Heacock C. Mike Gip, MD, have reviewed the above documentation for accuracy and completeness, and I agree with the above.

## 2020-06-24 ENCOUNTER — Inpatient Hospital Stay (HOSPITAL_BASED_OUTPATIENT_CLINIC_OR_DEPARTMENT_OTHER): Payer: Medicare Other | Admitting: Hematology and Oncology

## 2020-06-24 ENCOUNTER — Other Ambulatory Visit: Payer: Self-pay

## 2020-06-24 ENCOUNTER — Inpatient Hospital Stay: Payer: Medicare Other | Attending: Hematology and Oncology

## 2020-06-24 DIAGNOSIS — Z903 Acquired absence of stomach [part of]: Secondary | ICD-10-CM | POA: Diagnosis not present

## 2020-06-24 DIAGNOSIS — Z7901 Long term (current) use of anticoagulants: Secondary | ICD-10-CM

## 2020-06-24 DIAGNOSIS — I2699 Other pulmonary embolism without acute cor pulmonale: Secondary | ICD-10-CM | POA: Diagnosis not present

## 2020-06-25 ENCOUNTER — Encounter: Payer: Self-pay | Admitting: Hematology and Oncology

## 2020-08-26 DIAGNOSIS — U071 COVID-19: Secondary | ICD-10-CM

## 2020-08-26 HISTORY — DX: COVID-19: U07.1

## 2020-09-02 ENCOUNTER — Other Ambulatory Visit: Payer: Self-pay | Admitting: Family Medicine

## 2020-09-02 DIAGNOSIS — Z86711 Personal history of pulmonary embolism: Secondary | ICD-10-CM

## 2020-09-02 DIAGNOSIS — U071 COVID-19: Secondary | ICD-10-CM

## 2020-09-02 DIAGNOSIS — J1282 Pneumonia due to coronavirus disease 2019: Secondary | ICD-10-CM

## 2020-09-03 ENCOUNTER — Other Ambulatory Visit: Payer: Self-pay

## 2020-09-03 ENCOUNTER — Ambulatory Visit
Admission: RE | Admit: 2020-09-03 | Discharge: 2020-09-03 | Disposition: A | Discharge status: Home / Self Care | Payer: Medicare Other | Source: Ambulatory Visit | Attending: Family Medicine | Admitting: Family Medicine

## 2020-09-03 DIAGNOSIS — U071 COVID-19: Secondary | ICD-10-CM | POA: Insufficient documentation

## 2020-09-03 DIAGNOSIS — J1282 Pneumonia due to coronavirus disease 2019: Secondary | ICD-10-CM | POA: Insufficient documentation

## 2020-09-03 DIAGNOSIS — Z86711 Personal history of pulmonary embolism: Secondary | ICD-10-CM | POA: Insufficient documentation

## 2020-09-03 LAB — POCT I-STAT CREATININE: Creatinine, Ser: 0.9 mg/dL (ref 0.44–1.00)

## 2020-09-03 MED ORDER — IOHEXOL 350 MG/ML SOLN
75.0000 mL | Freq: Once | INTRAVENOUS | Status: AC | PRN
  Administered 2020-09-03: 75 mL via INTRAVENOUS

## 2020-09-27 ENCOUNTER — Other Ambulatory Visit (INDEPENDENT_AMBULATORY_CARE_PROVIDER_SITE_OTHER): Payer: Self-pay | Admitting: Vascular Surgery

## 2020-09-27 DIAGNOSIS — I2699 Other pulmonary embolism without acute cor pulmonale: Secondary | ICD-10-CM

## 2020-09-27 DIAGNOSIS — Z95828 Presence of other vascular implants and grafts: Secondary | ICD-10-CM

## 2020-09-27 DIAGNOSIS — I872 Venous insufficiency (chronic) (peripheral): Secondary | ICD-10-CM

## 2020-09-30 ENCOUNTER — Encounter (INDEPENDENT_AMBULATORY_CARE_PROVIDER_SITE_OTHER): Payer: Self-pay

## 2020-09-30 ENCOUNTER — Ambulatory Visit (INDEPENDENT_AMBULATORY_CARE_PROVIDER_SITE_OTHER): Payer: Medicare Other

## 2020-09-30 ENCOUNTER — Ambulatory Visit (INDEPENDENT_AMBULATORY_CARE_PROVIDER_SITE_OTHER): Payer: Medicare Other | Admitting: Nurse Practitioner

## 2020-09-30 ENCOUNTER — Other Ambulatory Visit: Payer: Self-pay

## 2020-09-30 VITALS — BP 145/83 | HR 70 | Ht 63.0 in | Wt 254.0 lb

## 2020-09-30 DIAGNOSIS — E782 Mixed hyperlipidemia: Secondary | ICD-10-CM

## 2020-09-30 DIAGNOSIS — I890 Lymphedema, not elsewhere classified: Secondary | ICD-10-CM

## 2020-09-30 DIAGNOSIS — Z95828 Presence of other vascular implants and grafts: Secondary | ICD-10-CM

## 2020-09-30 DIAGNOSIS — I10 Essential (primary) hypertension: Secondary | ICD-10-CM

## 2020-09-30 DIAGNOSIS — I2699 Other pulmonary embolism without acute cor pulmonale: Secondary | ICD-10-CM | POA: Diagnosis not present

## 2020-09-30 DIAGNOSIS — I872 Venous insufficiency (chronic) (peripheral): Secondary | ICD-10-CM | POA: Diagnosis not present

## 2020-10-05 ENCOUNTER — Other Ambulatory Visit: Payer: Self-pay

## 2020-10-05 ENCOUNTER — Encounter: Payer: Self-pay | Admitting: Hematology and Oncology

## 2020-10-05 DIAGNOSIS — E039 Hypothyroidism, unspecified: Secondary | ICD-10-CM | POA: Insufficient documentation

## 2020-10-05 DIAGNOSIS — Z8616 Personal history of COVID-19: Secondary | ICD-10-CM | POA: Insufficient documentation

## 2020-10-05 DIAGNOSIS — Z79899 Other long term (current) drug therapy: Secondary | ICD-10-CM | POA: Insufficient documentation

## 2020-10-05 DIAGNOSIS — K625 Hemorrhage of anus and rectum: Secondary | ICD-10-CM | POA: Diagnosis present

## 2020-10-05 DIAGNOSIS — K922 Gastrointestinal hemorrhage, unspecified: Principal | ICD-10-CM | POA: Insufficient documentation

## 2020-10-05 DIAGNOSIS — J45909 Unspecified asthma, uncomplicated: Secondary | ICD-10-CM | POA: Diagnosis not present

## 2020-10-05 DIAGNOSIS — Z7901 Long term (current) use of anticoagulants: Secondary | ICD-10-CM | POA: Insufficient documentation

## 2020-10-05 DIAGNOSIS — I10 Essential (primary) hypertension: Secondary | ICD-10-CM | POA: Insufficient documentation

## 2020-10-05 DIAGNOSIS — Z86711 Personal history of pulmonary embolism: Secondary | ICD-10-CM | POA: Insufficient documentation

## 2020-10-05 DIAGNOSIS — Z20822 Contact with and (suspected) exposure to covid-19: Secondary | ICD-10-CM | POA: Diagnosis not present

## 2020-10-05 LAB — CBC
HCT: 37.7 % (ref 36.0–46.0)
Hemoglobin: 13.2 g/dL (ref 12.0–15.0)
MCH: 30.3 pg (ref 26.0–34.0)
MCHC: 35 g/dL (ref 30.0–36.0)
MCV: 86.5 fL (ref 80.0–100.0)
Platelets: 341 10*3/uL (ref 150–400)
RBC: 4.36 MIL/uL (ref 3.87–5.11)
RDW: 13.8 % (ref 11.5–15.5)
WBC: 11.6 10*3/uL — ABNORMAL HIGH (ref 4.0–10.5)
nRBC: 0 % (ref 0.0–0.2)

## 2020-10-05 NOTE — ED Triage Notes (Addendum)
Pt c/o rectal bleeding since 1859. Bleeding described as dark red with clots.  Pt also reports LUQ abdominal pain and fatigue x 2 days. Pt also reports excessive flatulence x 2 days. Covid + on Jan 17 and sts she "hasn't been feeling herself" since then.  Denies fever.   Pt takes coumadin and sts level was therapuetic today.

## 2020-10-06 ENCOUNTER — Encounter (INDEPENDENT_AMBULATORY_CARE_PROVIDER_SITE_OTHER): Payer: Self-pay | Admitting: Nurse Practitioner

## 2020-10-06 ENCOUNTER — Emergency Department: Payer: Medicare Other

## 2020-10-06 ENCOUNTER — Encounter: Payer: Self-pay | Admitting: Emergency Medicine

## 2020-10-06 ENCOUNTER — Observation Stay
Admission: EM | Admit: 2020-10-06 | Discharge: 2020-10-07 | Disposition: A | Discharge status: Home / Self Care | Payer: Medicare Other | Attending: Internal Medicine | Admitting: Internal Medicine

## 2020-10-06 DIAGNOSIS — Z7901 Long term (current) use of anticoagulants: Secondary | ICD-10-CM

## 2020-10-06 DIAGNOSIS — K625 Hemorrhage of anus and rectum: Secondary | ICD-10-CM

## 2020-10-06 DIAGNOSIS — K922 Gastrointestinal hemorrhage, unspecified: Secondary | ICD-10-CM | POA: Diagnosis not present

## 2020-10-06 DIAGNOSIS — I160 Hypertensive urgency: Secondary | ICD-10-CM | POA: Diagnosis not present

## 2020-10-06 DIAGNOSIS — R1084 Generalized abdominal pain: Secondary | ICD-10-CM

## 2020-10-06 DIAGNOSIS — R519 Headache, unspecified: Secondary | ICD-10-CM

## 2020-10-06 DIAGNOSIS — Z95828 Presence of other vascular implants and grafts: Secondary | ICD-10-CM

## 2020-10-06 DIAGNOSIS — R110 Nausea: Secondary | ICD-10-CM

## 2020-10-06 DIAGNOSIS — Z86711 Personal history of pulmonary embolism: Secondary | ICD-10-CM

## 2020-10-06 HISTORY — DX: Personal history of other diseases of the digestive system: Z87.19

## 2020-10-06 LAB — COMPREHENSIVE METABOLIC PANEL
ALT: 16 U/L (ref 0–44)
AST: 16 U/L (ref 15–41)
Albumin: 3.7 g/dL (ref 3.5–5.0)
Alkaline Phosphatase: 46 U/L (ref 38–126)
Anion gap: 8 (ref 5–15)
BUN: 18 mg/dL (ref 6–20)
CO2: 22 mmol/L (ref 22–32)
Calcium: 8.8 mg/dL — ABNORMAL LOW (ref 8.9–10.3)
Chloride: 109 mmol/L (ref 98–111)
Creatinine, Ser: 0.81 mg/dL (ref 0.44–1.00)
GFR, Estimated: >60 mL/min (ref >60)
Glucose, Bld: 87 mg/dL (ref 70–99)
Potassium: 3.9 mmol/L (ref 3.5–5.1)
Sodium: 139 mmol/L (ref 135–145)
Total Bilirubin: 0.6 mg/dL (ref 0.3–1.2)
Total Protein: 7.1 g/dL (ref 6.5–8.1)

## 2020-10-06 LAB — VITAMIN B12: Vitamin B-12: 610 pg/mL (ref 180–914)

## 2020-10-06 LAB — HEMOGLOBIN AND HEMATOCRIT, BLOOD
HCT: 35 % — ABNORMAL LOW (ref 36.0–46.0)
HCT: 35.4 % — ABNORMAL LOW (ref 36.0–46.0)
HCT: 39.7 % (ref 36.0–46.0)
Hemoglobin: 11.9 g/dL — ABNORMAL LOW (ref 12.0–15.0)
Hemoglobin: 11.7 g/dL — ABNORMAL LOW (ref 12.0–15.0)
Hemoglobin: 12.5 g/dL (ref 12.0–15.0)

## 2020-10-06 LAB — CBC
HCT: 36.3 % (ref 36.0–46.0)
HCT: 35.6 % — ABNORMAL LOW (ref 36.0–46.0)
Hemoglobin: 12.1 g/dL (ref 12.0–15.0)
Hemoglobin: 11.9 g/dL — ABNORMAL LOW (ref 12.0–15.0)
MCH: 29.5 pg (ref 26.0–34.0)
MCH: 29.9 pg (ref 26.0–34.0)
MCHC: 33.3 g/dL (ref 30.0–36.0)
MCHC: 33.4 g/dL (ref 30.0–36.0)
MCV: 88.5 fL (ref 80.0–100.0)
MCV: 89.4 fL (ref 80.0–100.0)
Platelets: 326 10*3/uL (ref 150–400)
Platelets: 332 10*3/uL (ref 150–400)
RBC: 4.1 MIL/uL (ref 3.87–5.11)
RBC: 3.98 MIL/uL (ref 3.87–5.11)
RDW: 13.8 % (ref 11.5–15.5)
RDW: 14 % (ref 11.5–15.5)
WBC: 12.9 10*3/uL — ABNORMAL HIGH (ref 4.0–10.5)
WBC: 10.5 10*3/uL (ref 4.0–10.5)
nRBC: 0 % (ref 0.0–0.2)
nRBC: 0 % (ref 0.0–0.2)

## 2020-10-06 LAB — PROTIME-INR
INR: 2.5 — ABNORMAL HIGH (ref 0.8–1.2)
INR: 2.6 — ABNORMAL HIGH (ref 0.8–1.2)
Prothrombin Time: 26.3 seconds — ABNORMAL HIGH (ref 11.4–15.2)
Prothrombin Time: 26.7 seconds — ABNORMAL HIGH (ref 11.4–15.2)

## 2020-10-06 LAB — TYPE AND SCREEN
ABO/RH(D): O POS
Antibody Screen: NEGATIVE

## 2020-10-06 LAB — IRON AND TIBC
Iron: 50 ug/dL (ref 28–170)
Saturation Ratios: 21 % (ref 10.4–31.8)
TIBC: 237 ug/dL — ABNORMAL LOW (ref 250–450)
UIBC: 187 ug/dL

## 2020-10-06 LAB — GLUCOSE, CAPILLARY
Glucose-Capillary: 80 mg/dL (ref 70–99)
Glucose-Capillary: 103 mg/dL — ABNORMAL HIGH (ref 70–99)
Glucose-Capillary: 88 mg/dL (ref 70–99)
Glucose-Capillary: 101 mg/dL — ABNORMAL HIGH (ref 70–99)

## 2020-10-06 LAB — BASIC METABOLIC PANEL
Anion gap: 8 (ref 5–15)
BUN: 15 mg/dL (ref 6–20)
CO2: 22 mmol/L (ref 22–32)
Calcium: 8.4 mg/dL — ABNORMAL LOW (ref 8.9–10.3)
Chloride: 107 mmol/L (ref 98–111)
Creatinine, Ser: 0.89 mg/dL (ref 0.44–1.00)
GFR, Estimated: >60 mL/min (ref >60)
Glucose, Bld: 81 mg/dL (ref 70–99)
Potassium: 4 mmol/L (ref 3.5–5.1)
Sodium: 137 mmol/L (ref 135–145)

## 2020-10-06 LAB — RESP PANEL BY RT-PCR (FLU A&B, COVID) ARPGX2
Influenza A by PCR: NEGATIVE
Influenza B by PCR: NEGATIVE
SARS Coronavirus 2 by RT PCR: NEGATIVE

## 2020-10-06 LAB — HEMOGLOBIN A1C
Hgb A1c MFr Bld: 5.4 % (ref 4.8–5.6)
Mean Plasma Glucose: 108.28 mg/dL

## 2020-10-06 LAB — HIV ANTIBODY (ROUTINE TESTING W REFLEX): HIV Screen 4th Generation wRfx: NONREACTIVE

## 2020-10-06 LAB — FERRITIN: Ferritin: 111 ng/mL (ref 11–307)

## 2020-10-06 LAB — FOLATE: Folate: 11.4 ng/mL (ref >5.9)

## 2020-10-06 LAB — APTT: aPTT: 99 seconds — ABNORMAL HIGH (ref 24–36)

## 2020-10-06 LAB — TSH: TSH: 6.3 u[IU]/mL — ABNORMAL HIGH (ref 0.350–4.500)

## 2020-10-06 MED ORDER — IOHEXOL 350 MG/ML SOLN
100.0000 mL | Freq: Once | INTRAVENOUS | Status: AC | PRN
  Administered 2020-10-06: 100 mL via INTRAVENOUS

## 2020-10-06 MED ORDER — ONDANSETRON HCL 4 MG/2ML IJ SOLN
4.0000 mg | Freq: Four times a day (QID) | INTRAMUSCULAR | Status: DC | PRN
  Administered 2020-10-06: 4 mg via INTRAVENOUS
  Filled 2020-10-06: qty 2

## 2020-10-06 MED ORDER — AMLODIPINE BESYLATE 5 MG PO TABS: 10.0000 mg | ORAL_TABLET | Freq: Every day | ORAL | Status: DC

## 2020-10-06 MED ORDER — FAMOTIDINE 20 MG PO TABS
40.0000 mg | ORAL_TABLET | Freq: Every day | ORAL | Status: DC
  Administered 2020-10-06 – 2020-10-07 (×2): 40 mg via ORAL
  Filled 2020-10-06 (×2): qty 2

## 2020-10-06 MED ORDER — ONDANSETRON HCL 4 MG PO TABS: 4.0000 mg | ORAL_TABLET | Freq: Four times a day (QID) | ORAL | Status: DC | PRN

## 2020-10-06 MED ORDER — ACETAMINOPHEN 650 MG RE SUPP: 650.0000 mg | Freq: Four times a day (QID) | RECTAL | Status: DC | PRN

## 2020-10-06 MED ORDER — ROSUVASTATIN CALCIUM 20 MG PO TABS
20.0000 mg | ORAL_TABLET | Freq: Every day | ORAL | Status: DC
  Administered 2020-10-06 – 2020-10-07 (×2): 20 mg via ORAL
  Filled 2020-10-06 (×2): qty 1
  Filled 2020-10-06: qty 2

## 2020-10-06 MED ORDER — IPRATROPIUM-ALBUTEROL 0.5-2.5 (3) MG/3ML IN SOLN
3.0000 mL | RESPIRATORY_TRACT | Status: DC | PRN
  Administered 2020-10-06: 3 mL via RESPIRATORY_TRACT
  Filled 2020-10-06: qty 3

## 2020-10-06 MED ORDER — CLINDAMYCIN PHOSPHATE 1 % EX LOTN: 1.0000 "application " | TOPICAL_LOTION | Freq: Two times a day (BID) | CUTANEOUS | Status: DC | PRN

## 2020-10-06 MED ORDER — MORPHINE SULFATE (PF) 2 MG/ML IV SOLN
2.0000 mg | INTRAVENOUS | Status: DC | PRN
  Administered 2020-10-06 (×2): 2 mg via INTRAVENOUS
  Filled 2020-10-06 (×2): qty 1

## 2020-10-06 MED ORDER — TIZANIDINE HCL 4 MG PO TABS
4.0000 mg | ORAL_TABLET | Freq: Two times a day (BID) | ORAL | Status: DC | PRN
  Filled 2020-10-06: qty 1

## 2020-10-06 MED ORDER — POLYVINYL ALCOHOL 1.4 % OP SOLN
Freq: Every day | OPHTHALMIC | Status: DC | PRN
  Filled 2020-10-06: qty 15

## 2020-10-06 MED ORDER — MONTELUKAST SODIUM 10 MG PO TABS
10.0000 mg | ORAL_TABLET | Freq: Every day | ORAL | Status: DC
  Administered 2020-10-06: 10 mg via ORAL
  Filled 2020-10-06: qty 1

## 2020-10-06 MED ORDER — EPINEPHRINE 0.3 MG/0.3ML IJ SOAJ
0.3000 mg | INTRAMUSCULAR | Status: DC | PRN
  Filled 2020-10-06: qty 0.3

## 2020-10-06 MED ORDER — PANTOPRAZOLE SODIUM 40 MG PO TBEC
40.0000 mg | DELAYED_RELEASE_TABLET | Freq: Two times a day (BID) | ORAL | Status: DC
  Administered 2020-10-06 – 2020-10-07 (×3): 40 mg via ORAL
  Filled 2020-10-06 (×3): qty 1

## 2020-10-06 MED ORDER — LINACLOTIDE 72 MCG PO CAPS
72.0000 ug | ORAL_CAPSULE | Freq: Every day | ORAL | Status: DC | PRN
  Filled 2020-10-06: qty 1

## 2020-10-06 MED ORDER — ONDANSETRON HCL 4 MG PO TABS: 8.0000 mg | ORAL_TABLET | Freq: Three times a day (TID) | ORAL | Status: DC | PRN

## 2020-10-06 MED ORDER — SODIUM FLUORIDE 1.1 % DT GEL: 1.0000 "application " | Freq: Every day | DENTAL | Status: DC

## 2020-10-06 MED ORDER — GABAPENTIN 100 MG PO CAPS
100.0000 mg | ORAL_CAPSULE | Freq: Every day | ORAL | Status: DC
  Administered 2020-10-06: 100 mg via ORAL
  Filled 2020-10-06: qty 1

## 2020-10-06 MED ORDER — BUDESONIDE 0.5 MG/2ML IN SUSP
0.5000 mg | Freq: Every day | RESPIRATORY_TRACT | Status: DC
  Administered 2020-10-06 – 2020-10-07 (×2): 0.5 mg via RESPIRATORY_TRACT
  Filled 2020-10-06 (×2): qty 2

## 2020-10-06 MED ORDER — MELATONIN 3 MG PO TABS
3.0000 mg | ORAL_TABLET | Freq: Every evening | ORAL | Status: DC | PRN
Start: 2020-10-06 — End: 2020-10-07

## 2020-10-06 MED ORDER — LEVOTHYROXINE SODIUM 137 MCG PO TABS
137.0000 ug | ORAL_TABLET | Freq: Every day | ORAL | Status: DC
  Administered 2020-10-06 – 2020-10-07 (×2): 137 ug via ORAL
  Filled 2020-10-06 (×3): qty 1

## 2020-10-06 MED ORDER — MORPHINE SULFATE (PF) 4 MG/ML IV SOLN
4.0000 mg | Freq: Once | INTRAVENOUS | Status: AC
  Administered 2020-10-06: 4 mg via INTRAVENOUS
  Filled 2020-10-06: qty 1

## 2020-10-06 MED ORDER — SODIUM CHLORIDE 0.9 % IV SOLN: INTRAVENOUS | Status: DC

## 2020-10-06 MED ORDER — PREDNISOLONE ACETATE 1 % OP SUSP
1.0000 [drp] | OPHTHALMIC | Status: DC | PRN
  Filled 2020-10-06: qty 1

## 2020-10-06 MED ORDER — OXYBUTYNIN CHLORIDE ER 5 MG PO TB24
5.0000 mg | ORAL_TABLET | Freq: Every day | ORAL | Status: DC
  Administered 2020-10-06: 5 mg via ORAL
  Filled 2020-10-06 (×2): qty 1

## 2020-10-06 MED ORDER — ACETAMINOPHEN 325 MG PO TABS
650.0000 mg | ORAL_TABLET | Freq: Four times a day (QID) | ORAL | Status: DC | PRN
  Administered 2020-10-06 – 2020-10-07 (×2): 650 mg via ORAL
  Filled 2020-10-06 (×2): qty 2

## 2020-10-06 MED ORDER — FLUTICASONE PROPIONATE 50 MCG/ACT NA SUSP
1.0000 | Freq: Every day | NASAL | Status: DC | PRN
  Filled 2020-10-06: qty 16

## 2020-10-06 MED ORDER — LABETALOL HCL 5 MG/ML IV SOLN: 20.0000 mg | INTRAVENOUS | Status: DC | PRN

## 2020-10-06 MED ORDER — TRAMADOL HCL 50 MG PO TABS
50.0000 mg | ORAL_TABLET | Freq: Four times a day (QID) | ORAL | Status: DC | PRN
  Administered 2020-10-06: 50 mg via ORAL
  Filled 2020-10-06: qty 1

## 2020-10-06 MED ORDER — INSULIN ASPART 100 UNIT/ML ~~LOC~~ SOLN: 0.0000 [IU] | Freq: Three times a day (TID) | SUBCUTANEOUS | Status: DC

## 2020-10-06 MED ORDER — TOPIRAMATE 25 MG PO TABS: 50.0000 mg | ORAL_TABLET | Freq: Two times a day (BID) | ORAL | Status: DC

## 2020-10-06 MED ORDER — ADULT MULTIVITAMIN W/MINERALS CH
1.0000 | ORAL_TABLET | Freq: Every day | ORAL | Status: DC
  Administered 2020-10-06 – 2020-10-07 (×2): 1 via ORAL
  Filled 2020-10-06 (×2): qty 1

## 2020-10-06 MED ORDER — TOPIRAMATE 25 MG PO TABS: 100.0000 mg | ORAL_TABLET | Freq: Two times a day (BID) | ORAL | Status: DC

## 2020-10-06 MED ORDER — ONDANSETRON HCL 4 MG/2ML IJ SOLN
4.0000 mg | INTRAMUSCULAR | Status: AC
  Administered 2020-10-06: 4 mg via INTRAVENOUS
  Filled 2020-10-06: qty 2

## 2020-10-06 MED ORDER — ERGOCALCIFEROL 1.25 MG (50000 UT) PO CAPS: 50000.0000 [IU] | ORAL_CAPSULE | ORAL | Status: DC

## 2020-10-06 MED ORDER — SODIUM CHLORIDE 0.9 % IV SOLN: Freq: Once | INTRAVENOUS | Status: AC

## 2020-10-06 MED ORDER — CYCLOBENZAPRINE HCL 10 MG PO TABS: 10.0000 mg | ORAL_TABLET | Freq: Three times a day (TID) | ORAL | Status: DC | PRN

## 2020-10-06 MED ORDER — LEVOTHYROXINE SODIUM 50 MCG PO TABS: 150.0000 ug | ORAL_TABLET | Freq: Every day | ORAL | Status: DC

## 2020-10-06 MED ORDER — LORATADINE 10 MG PO TABS
10.0000 mg | ORAL_TABLET | Freq: Every day | ORAL | Status: DC
  Administered 2020-10-06 – 2020-10-07 (×2): 10 mg via ORAL
  Filled 2020-10-06 (×2): qty 1

## 2020-10-06 MED ORDER — ALBUTEROL SULFATE HFA 108 (90 BASE) MCG/ACT IN AERS
2.0000 | INHALATION_SPRAY | Freq: Four times a day (QID) | RESPIRATORY_TRACT | Status: DC | PRN
  Filled 2020-10-06: qty 6.7

## 2020-10-06 MED ORDER — MECLIZINE HCL 25 MG PO TABS
12.5000 mg | ORAL_TABLET | Freq: Three times a day (TID) | ORAL | Status: DC | PRN
  Administered 2020-10-06: 12.5 mg via ORAL
  Filled 2020-10-06 (×3): qty 0.5

## 2020-10-06 MED ORDER — TRAZODONE HCL 50 MG PO TABS
25.0000 mg | ORAL_TABLET | Freq: Every evening | ORAL | Status: DC | PRN
  Administered 2020-10-06: 25 mg via ORAL
  Filled 2020-10-06: qty 1

## 2020-10-06 NOTE — Progress Notes (Signed)
No charge progress note.  Angela [name] is a 45 y.o. African-American female with medical history significant for multiple medical problems  including asthma, depression, Strick sleeve surgery followed by GI bleed requiring embolization, fibromyalgia, GERD, hypertension, diverticulosis and hypothyroidism, who presented to the ER with a [place] of bright red bleeding per rectum that started around 7 PM with associated abdominal discomfort and flatulence.  She had mild dysuria without oliguria or hematuria or flank pain.  She admits to nausea without vomiting.  She has been having chills and has not checked her temperature.  She had COVID-19 on 08/26/2020 and recovered.  She was afebrile, INR was 2.5.  Patient is on Coumadin, hemoglobin decreased to 11.9 with baseline at 13.2. CTA abdomen with no obvious source of bleeding and diverticulosis. Concern of diverticular bleed. Coumadin was discontinued and GI was consulted.  Patient continued to have bright red blood per rectum.  Hemoglobin this morning was 11.9.  She denies any nausea or vomiting.  She was having mild discomfort around left lower quadrant and left mid quadrant area, no tenderness.  She was also experiencing some headache for which she received tramadol little while ago.  Per patient she is just not feeling well. Waiting for GI evaluation. We will keep the patient n.p.o.  Per patient she is now more using amlodipine and Topamax which were discontinued.

## 2020-10-06 NOTE — ED Provider Notes (Signed)
[name] Provider Note  ____________________________________________   Event Date/Time   First MD Initiated Contact with Patient 10/06/20 0225     (approximate)  I have reviewed the triage vital signs and the nursing notes.   HISTORY  Chief Complaint Rectal Bleeding    HPI [name] is a 45 y.o. 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  She presents tonight for evaluation of acute onset lower GI bleeding, bright red blood per rectum with blood clots visible.  This started about 7:00 tonight.  She has had 2 days of generalized abdominal pain and cramping with a feeling of bloating and excessive and foul-smelling flatus.  The bleeding did not start until she had an urgent bowel movement tonight at approximately 7 PM and noticed a significant amount of blood.  She contacted her hematologist, Dr. Mike Gip, who recommended that if she has additional bleeding she come to the emergency department since she is on warfarin.  She subsequently had at least one if not 2 additional bowel movements with bright red blood, and when she came to the emergency department she had another bowel movement which she left in the toilet for me to see and was also notable for substantial amount of blood.  The onset of the symptoms is acute and severe.  Nothing in particular makes it better or worse.  She has had no lightheadedness nor dizziness, no syncope, no chest pain nor shortness of breath.  She denies vomiting but has had nausea.  She recently checked her INR and it was about 3 which is therapeutic for her.  She is also having a generalized headache but no numbness nor weakness of her extremities.         Past Medical  History:  Diagnosis Date  . Anemia   . Anxiety   . Asthma   . COVID-19 08/26/2020   Tested positive on 08/26/2020  . Depression   . Fibromyalgia   . GERD (gastroesophageal reflux disease)   . History of GI diverticular bleed   . Hypertension   . Irritable bowel syndrome (IBS)   . Pseudotumor cerebri   . Pulmonary embolism ([place])   . Raynaud's disease   . Thyroid disease    hypothyroid  . Vision disturbance     Patient Active Problem List   Diagnosis Date Noted  . GI bleeding 10/06/2020  . Abnormal mammogram of left breast 01/14/2020  . H/O bariatric surgery 01/01/2020  . Acute blood loss anemia 11/01/2019  . S/P partial gastrectomy 11/01/2019  . Hemorrhagic shock ([place]) 10/31/2019  . Hemoperitoneum   . Chronic low back pain (Primary Area of Pain) (Bilateral) (L>R) w/o sciatica 08/16/2019  . Chronic upper back pain (Secondary area of Pain) ([place]) 08/16/2019  . Chronic thoracic spine pain 08/16/2019  . Osteoarthritis involving multiple joints 08/16/2019  . Fibromyalgia 08/16/2019  . Chronic musculoskeletal pain 08/16/2019  . Chronic knee pain (Third area of Pain) (Bilateral) (R>L) 08/16/2019  . Cervicalgia 08/16/2019  . Cervicogenic headache 08/16/2019  . Chronic lower extremity pain (Fourth area of Pain) (Bilateral) (L>R) 08/16/2019  . Chronic shoulder pain (Fifth area of Pain) (Bilateral) 08/16/2019  . Chronic generalized pain 08/16/2019  . Chronic pain syndrome 08/15/2019  . Pharmacologic therapy 08/15/2019  . Disorder of skeletal system 08/15/2019  .  Problems influencing health status 08/15/2019  . Stomach irritation   . Melena   . Hematochezia   . Internal hemorrhoids   . Diverticulosis of large intestine without diverticulitis   . GI bleed 05/26/2019  . Chronic venous insufficiency 05/22/2019  . Lymphedema 05/22/2019  . Uveitis 05/22/2019  . Cardiomegaly 03/12/2019  . Right lower lobe pulmonary nodule 01/30/2019  . Weight gain 01/30/2019  . Pleural nodule  01/19/2019  . Chronic anticoagulation (Coumadin) 01/06/2019  . 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[place]) 12/26/2018  . Pulmonary embolism with infarction ([place]) 12/20/2018  . Galactorrhea (CODE) 05/31/2018  . Fatigue 01/25/2018  . Hyperlipidemia 01/25/2018  . Hyperprolactinemia ([place]) 01/25/2018  . Hypothyroidism 01/25/2018  . Irritable bowel syndrome without diarrhea 01/25/2018  . Obesity, morbid ([place]) 01/25/2018  . Raynaud's disease 01/25/2018  . Acquired absence of other genital organ(s) 01/25/2018  . Post-traumatic stress disorder, unspecified 01/25/2018  . Single liveborn infant, delivered by cesarean 01/25/2018  . Status post tonsillectomy 01/25/2018  . Depression 01/25/2018  . Nipple discharge in female 11/27/2016  . Asthma, mild 02/20/2016  . Morbid obesity with body mass index of 45.0-49.9 in adult [facility]) 12/20/2015  . Non-seasonal allergic rhinitis due to pollen 12/20/2015  . Migraine without aura and without status migrainosus, not intractable 12/20/2015  . Other shoulder lesions, unspecified shoulder 07/22/2015  . Rotator cuff tendinitis 07/22/2015  . Clinical depression 06/18/2015  . Headache, migraine 06/18/2015  . 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 Calculus of kidney 02/20/2015  . Chronic sinusitis 02/20/2015  . Ringing in ear 02/20/2015  . Stuffy nose 02/20/2015  . Rheumatoid arthritis of knee ([place]) 10/09/2014  . 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[place]) 10/09/2014  . Mass of breast, right 05/24/2014  . Lump or mass in breast 05/29/2013  . Cystic breast, left 05/29/2013  . Cephalalgia 04/26/2013  . Fibroid 04/26/2013  . Muscle ache 04/26/2013  . Bacterial infection 04/26/2013  . Pain in or around eye 04/26/2013  . Urinary tract infection 04/26/2013  . Benign intracranial hypertension 12/08/2012  . Increased prolactin level 12/08/2012  . Vertigo 06/24/2012  . Anxiety state 06/12/2011    Past Surgical History:  Procedure  Laterality Date  . ABDOMINAL HYSTERECTOMY  02/06/13  . BREAST BIOPSY Right October, 2014   right breast core biopsy, fibroadenomatous changes  . BREAST BIOPSY Right December 2014   FNA retroareolar nodule consistent with fibroadenoma.  Marland Kitchen BREAST BIOPSY Right 12/16/2016   Fibroadenoma in the retroareolar area at 6:00.  Marland Kitchen [place]  . COLONOSCOPY  2015   Dr Allen Norris  . COLONOSCOPY WITH PROPOFOL N/A 05/27/2019   Procedure: COLONOSCOPY WITH PROPOFOL;  Surgeon: Virgel Manifold, MD;  Location: [facility] ENDOSCOPY;  Service: Endoscopy;  Laterality: N/A;  . EMBOLIZATION N/A 10/31/2019   Procedure: EMBOLIZATION;  Surgeon: Algernon Huxley, MD;  Location: [place] [facility];  Service: Cardiovascular;  Laterality: N/A;  . ESOPHAGOGASTRODUODENOSCOPY (EGD) WITH PROPOFOL N/A 05/27/2019   Procedure: ESOPHAGOGASTRODUODENOSCOPY (EGD) WITH PROPOFOL;  Surgeon: Virgel Manifold, MD;  Location: [facility] ENDOSCOPY;  Service: Endoscopy;  Laterality: N/A;  . IVC FILTER INSERTION N/A 05/29/2019   Procedure: IVC FILTER INSERTION;  Surgeon: Algernon Huxley, MD;  Location: [place] [facility];  Service: Cardiovascular;  Laterality: N/A;  . RHINOPLASTY    . TONSILECTOMY, ADENOIDECTOMY, BILATERAL MYRINGOTOMY AND TUBES  2004  . UPPER GASTROINTESTINAL ENDOSCOPY  2015   Dr Allen Norris    Prior to Admission medications   Medication Sig Start Date End  Date Taking? Authorizing Provider  acetaminophen (TYLENOL) 500 MG tablet Take 1,000-1,500 mg by mouth every 6 (six) hours as needed for moderate pain or headache.    [provider]  albuterol (PROVENTIL HFA;VENTOLIN HFA) 108 (90 BASE) MCG/ACT inhaler Inhale 2 puffs into the lungs every 6 (six) hours as needed for wheezing.    [provider]  amLODipine (NORVASC) 10 MG tablet Take by mouth. 01/25/18   [provider]  budesonide (PULMICORT) 0.5 MG/2ML nebulizer solution Inhale into the lungs. 09/13/20   [provider]  budesonide  (PULMICORT) 0.5 MG/2ML nebulizer solution Take 2 mLs by nebulization 2 (two) times daily. 09/13/20   [provider]  Carboxymethylcellulose Sodium (REFRESH LIQUIGEL OP) Place 1 drop into both eyes daily as needed (dry eyes).    [provider]  Cholecalciferol 125 MCG (5000 UT) TABS Take by mouth.    [provider]  clindamycin (CLEOCIN T) 1 % lotion Apply 1 application topically 2 (two) times daily as needed (acne).    [provider]  cyclobenzaprine (FLEXERIL) 10 MG tablet Take 1 tablet (10 mg total) by mouth as needed. 04/10/19   Lucilla Lame, MD  Difluprednate 0.05 % EMUL Place 1 drop into both eyes every 6 (six) hours as needed (uveitis).     [provider]  diphenhydrAMINE (BENADRYL) 25 mg capsule Take 25 mg by mouth 2 (two) times daily as needed for allergies or sleep.     [provider]  enoxaparin (LOVENOX) 150 MG/ML injection Inject 0.8 mLs (120 mg total) into the skin every 12 (twelve) hours for 2 doses. Inject 0.8 Ml subcutaneously before long trips/travel (>4 hours) repeat for the return trip prn 02/08/20 02/09/20  Schnier, Dolores Lory, MD  EPINEPHrine 0.3 mg/0.3 mL IJ SOAJ injection Inject 0.3 mg into the muscle as needed for anaphylaxis.     [provider]  ergocalciferol (VITAMIN D2) 1.25 MG (50000 UT) capsule Take by mouth. 10/01/19   [provider]  famotidine (PEPCID) 40 MG tablet Take 40 mg by mouth daily. 05/17/20   [provider]  fluticasone (FLONASE) 50 MCG/ACT nasal spray Place 1 spray into both nostrils daily as needed for rhinitis.    [provider]  gabapentin (NEURONTIN) 100 MG capsule Take by mouth. 08/24/20   [provider]  Galcanezumab-gnlm 120 MG/ML SOAJ Inject 120 mg into the skin every 28 (twenty-eight) days.  01/30/19   [provider]  hydrocortisone 2.5 % cream Apply topically. 06/03/20   [provider]  levothyroxine (SYNTHROID) 137 MCG tablet Take  137 mcg by mouth daily. 09/24/20   [provider]  levothyroxine (SYNTHROID) 150 MCG tablet Take 150 mcg by mouth daily before breakfast.     [provider]  linaclotide (LINZESS) 72 MCG capsule Take 72 mcg by mouth daily as needed (constipation).    [provider]  loratadine (CLARITIN) 10 MG tablet Take 10 mg by mouth daily.    [provider]  meclizine (ANTIVERT) 25 MG tablet Take by mouth. 06/24/20   [provider]  Melatonin 3 MG CAPS Take 3 mg by mouth at bedtime as needed (sleep).    [provider]  montelukast (SINGULAIR) 10 MG tablet Take by mouth. 01/25/18   [provider]  Multiple Vitamin (MULTIVITAMIN WITH MINERALS) TABS tablet Take 1 tablet by mouth daily.    [provider]  mupirocin ointment (BACTROBAN) 2 % SMARTSIG:1 Application Topical 2-3 Times Daily 04/10/20   [provider]  nystatin (MYCOSTATIN/NYSTOP) powder Apply topically. 11/13/19 11/12/20  [provider]  ondansetron (ZOFRAN) 8 MG tablet Take 8 mg by mouth every 8 (eight) hours as needed for nausea or vomiting.    [provider]  oxybutynin (DITROPAN) 5 MG tablet Take 5 mg by mouth at bedtime.    [provider]  oxybutynin (DITROPAN-XL) 5 MG 24 hr tablet Take 5 mg by mouth daily. 08/08/20   [provider]  pantoprazole (PROTONIX) 40 MG tablet Take by mouth. 01/22/20 01/21/21  [provider]  polyethylene glycol (MIRALAX / GLYCOLAX) 17 g packet PLEASE SEE ATTACHED FOR DETAILED DIRECTIONS 10/10/19   [provider]  prednisoLONE acetate (PRED FORTE) 1 % ophthalmic suspension Place 1 drop into both eyes every 4 (four) hours as needed (uveitis).     [provider]  promethazine (PHENERGAN) 25 MG tablet Take 25 mg by mouth every 6 (six) hours as needed for nausea or vomiting.    [provider]  rosuvastatin (CRESTOR) 20 MG tablet Take 20 mg by mouth daily.    [provider]  Semaglutide,0.25 or 0.5MG /DOS, 2 MG/1.5ML SOPN Inject into the skin. 08/12/20 10/11/20  [provider]  sodium fluoride (FLUORISHIELD) 1.1 % GEL dental gel Place 1 application onto teeth at bedtime.     [provider]  tiZANidine (ZANAFLEX) 4 MG tablet Take 4 mg by mouth every 12 (twelve) hours as needed for muscle spasms.    [provider]  topiramate (TOPAMAX) 100 MG tablet Take 100 mg by mouth 2 (two) times daily. 09/15/19   [provider]  topiramate (TOPAMAX) 50 MG tablet Take 50 mg by mouth 2 (two) times daily.    [provider]  traMADol (ULTRAM) 50 MG tablet Take 1 tablet (50 mg total) by mouth every 6 (six) hours as needed for moderate pain. 05/30/19   Gouru, Illene Silver, MD  Ubrogepant 50 MG TABS Take by mouth. 06/05/20   [provider]  warfarin (COUMADIN) 3 MG tablet Take by mouth. 11/13/19 11/12/20  [provider]    Allergies Bee venom, Bupropion, Lisinopril, Amoxicillin, [name], Nortriptyline, Orange fruit [citrus], Other, Penicillins, Sulfa antibiotics, and Milnacipran  Family History  Problem Relation Age of Onset  . Depression Mother   . Migraines Mother   . Diverticulitis Mother   . Hypertension Mother   . Heart disease Father   . Diabetes Father   . Hypertension Father   . Stroke Sister   . Cancer Maternal Aunt   . Breast cancer Maternal Aunt   . Cancer Maternal Grandmother     Social History Social History   Tobacco Use  . Smoking status: Never Smoker  . Smokeless tobacco: Never Used  Vaping Use  . Vaping Use: Never used  Substance Use Topics  . Alcohol use: Yes    Comment: Occasionally Social Drinker  . Drug use: No    Review of Systems Constitutional: No fever/chills Eyes: No visual changes. ENT: No sore throat. Cardiovascular: Denies chest pain. Respiratory: Denies shortness of breath. Gastrointestinal: Bright red blood per rectum with generalized abdominal cramping and  nausea, no vomiting. Genitourinary: Negative for dysuria. Musculoskeletal: Negative for neck pain.  Negative for back pain. Integumentary: Negative for rash. Neurological: Generalized headache, no focal weakness or numbness.   ____________________________________________   PHYSICAL EXAM:  VITAL SIGNS: ED Triage Vitals  Enc Vitals Group     BP 10/05/20 2323 (!) 165/105     Pulse Rate 10/05/20 2323  89     Resp 10/05/20 2323 18     Temp 10/05/20 2323 98.6 F (37 C)     Temp Source 10/05/20 2323 Oral     SpO2 10/05/20 2323 97 %     Weight 10/05/20 2326 114.8 kg (253 lb)     Height 10/05/20 2326 1.6 m (5\' 3" )     Head Circumference --      Peak Flow --      Pain Score 10/05/20 2326 6     Pain Loc --      Pain Edu? --      Excl. in [place]? --    Constitutional: Alert and oriented.  Eyes: Conjunctivae are normal.  Head: Atraumatic. Nose: No congestion/rhinnorhea. Mouth/Throat: Patient is wearing a mask. Neck: No stridor.  No meningeal signs.   Cardiovascular: Normal rate, regular rhythm. Good peripheral circulation. Respiratory: Normal respiratory effort.  No retractions. Gastrointestinal: Obese.  Soft with minimal tenderness to palpation throughout, no localized peritonitis, no rebound and no guarding.  Deferred rectal exam based on patient preference. Musculoskeletal: No lower extremity tenderness nor edema. No gross deformities of extremities. Neurologic:  Normal speech and language. No gross focal neurologic deficits are appreciated.  Skin:  Skin is warm, dry and intact. Psychiatric: Mood and affect are normal. Speech and behavior are normal.  ____________________________________________   LABS (all labs ordered are listed, but only abnormal results are displayed)  Labs Reviewed  COMPREHENSIVE METABOLIC PANEL - Abnormal; Notable for the following components:      Result Value   Calcium 8.8 (*)    All other components within normal limits  CBC - Abnormal; Notable for  the following components:   WBC 11.6 (*)    All other components within normal limits  PROTIME-INR - Abnormal; Notable for the following components:   Prothrombin Time 26.7 (*)    INR 2.6 (*)    All other components within normal limits  RESP PANEL BY RT-PCR (FLU A&B, COVID) ARPGX2  POC OCCULT BLOOD, ED  TYPE AND SCREEN   ____________________________________________  EKG  No indication for emergent EKG ____________________________________________  RADIOLOGY I, Hinda Kehr, personally viewed and evaluated these images (plain radiographs) as part of my medical decision making, as well as reviewing the written report by the radiologist.  ED MD interpretation: No acute abnormality identified on CTA of the abdomen and pelvis (GI bleed protocol).  Official radiology report(s): CT Angio Abd/Pel w/ and/or w/o  Result Date: 10/06/2020 CLINICAL DATA:  Gastrointestinal hemorrhage, rectal bleeding, left upper quadrant abdominal pain, fatigue EXAM: CTA ABDOMEN AND PELVIS WITHOUT AND WITH CONTRAST TECHNIQUE: Multidetector CT imaging of the abdomen and pelvis was performed using the standard protocol during bolus administration of intravenous contrast. Multiplanar reconstructed images and MIPs were obtained and reviewed to evaluate the vascular anatomy. CONTRAST:  141mL OMNIPAQUE IOHEXOL 350 MG/ML SOLN COMPARISON:  10/31/2019 FINDINGS: VASCULAR Aorta: Unremarkable Celiac: Widely patent.  Classic anatomic configuration. SMA: Widely patent Renals: Single renal arteries are widely patent and demonstrate normal vascular morphology. No aneurysm. IMA: Widely patent Inflow: Widely patent. Internal iliac arteries are patent bilaterally. Proximal Outflow: Minimal atherosclerotic calcification within the left common femoral artery. Widely patent bilaterally. Left inferior epigastric artery has been coil embolized in the interval. Veins: Infrarenal inferior vena cava filter is in place. The abdominal venous  vasculature is otherwise unremarkable. Review of the MIP images confirms the above findings. NON-VASCULAR Lower chest: Visualized lungs are clear. Visualized heart and pericardium are unremarkable peer Hepatobiliary: No  focal liver abnormality is seen. No gallstones, gallbladder wall thickening, or biliary dilatation. Pancreas: Unremarkable Spleen: Unremarkable Adrenals/Urinary Tract: Adrenal glands are unremarkable. Kidneys are normal, without renal calculi, focal lesion, or hydronephrosis. Bladder is unremarkable. Stomach/Bowel: Surgical changes of a gastric sleeve resection are identified. The stomach, small bowel, and large bowel are unremarkable save for mild descending and sigmoid colonic diverticulosis. Appendix normal. No free intraperitoneal gas or fluid. Previously noted intraperitoneal hematoma have resolved. Lymphatic: No pathologic adenopathy within the abdomen and pelvis. Reproductive: Uterus and bilateral adnexa are unremarkable. Other: Previously noted subcutaneous hematoma within the anterior abdominal wall have nearly completely resolved. Tiny umbilical fat containing hernia. Rectum unremarkable. Musculoskeletal: No acute bone abnormality. No lytic or blastic bone lesions. IMPRESSION: VASCULAR No active gastrointestinal hemorrhage identified. Interval left inferior epigastric arterial embolization NON-VASCULAR Interval resolution of previously noted intraperitoneal hematoma. Status post gastric sleeve resection. Mild distal colonic diverticulosis Electronically Signed   By: Fidela Salisbury MD   On: 10/06/2020 03:35    ____________________________________________   PROCEDURES   Procedure(s) performed (including Critical Care):  Procedures   ____________________________________________   INITIAL IMPRESSION / MDM / [place] / ED COURSE  As part of my medical decision making, I reviewed the following data within the [place] notes reviewed and  incorporated, Labs reviewed , Old chart reviewed, Discussed with admitting physician (Dr. Sidney Ace) and Notes from prior ED visits   Differential diagnosis includes, but is not limited to, diverticular bleed, AV malformation, acute intra-abdominal infection, hemorrhoids, upper GI bleed.  The patient showed me which she passed in the toilet and it was notable for bright red blood, not melena.  She would prefer not to have a rectal exam since the bleeding is evident in the toilet and I think that is reasonable.  Minimal tenderness to palpation of the abdomen was given her history of complications from weight loss surgery and prior diverticular bleed I will obtain a CTA of the abdomen and pelvis to see if any source of bleeding can be specifically identified.  Vital signs are stable and within normal limits.  Comprehensive metabolic panel is normal.  CBC is essentially normal other than a very mildly cytosis of 11.6.  Her hemoglobin is still normal but her blood loss has been acute and I anticipate that serial H&H's will show some decrease.  Her INR is 2.6 and she has not taken her warfarin tonight.  Given her high risk of blood clot, I will not reverse her since she does not appear to have a life-threatening bleed at this time and she is hemodynamically stable.  We will keep her n.p.o., start normal saline 100 mL/h IV fluid infusion, and I am providing Zofran 4 mg IV for nausea and morphine 4 mg for pain that is primarily in her head although some intermittent abdominal cramping.  Anticipate admission for GI consult and serial lab work given an acute lower GI bleed on warfarin.       Clinical Course as of 10/06/20 0353  Sun Oct 06, 2020  0962 CT Angio Abd/Pel w/ and/or w/o No acute abnormalities identified on CTA-pelvis.  Consulting the hospitalist for admission.  Patient updated and remains hemodynamically stable [CF]  0353 Discussed case by phone with Dr. Sidney Ace with the hospitalist service [CF]     Clinical Course User Index [CF] Hinda Kehr, MD     ____________________________________________  FINAL CLINICAL IMPRESSION(S) / ED DIAGNOSES  Final diagnoses:  Lower GI bleed  Generalized abdominal  pain  Nausea  Generalized headache  Warfarin anticoagulation  History of pulmonary embolism  Presence of IVC filter     MEDICATIONS GIVEN DURING THIS VISIT:  Medications  morphine 4 MG/ML injection 4 mg (4 mg Intravenous Given 10/06/20 0300)  ondansetron (ZOFRAN) injection 4 mg (4 mg Intravenous Given 10/06/20 0300)  0.9 %  sodium chloride infusion ( Intravenous New Bag/Given 10/06/20 0346)  iohexol (OMNIPAQUE) 350 MG/ML injection 100 mL (100 mLs Intravenous Contrast Given 10/06/20 8675)     ED Discharge Orders    None      *Please note:  Havah Ammon was evaluated in Emergency Department on 10/06/2020 for the symptoms described in the history of present illness. She was evaluated in the context of the global COVID-19 pandemic, which necessitated consideration that the patient might be at risk for infection with the SARS-CoV-2 virus that causes COVID-19. Institutional protocols and algorithms that pertain to the evaluation of patients at risk for COVID-19 are in a state of rapid change based on information released by regulatory bodies including the CDC and federal and state organizations. These policies and algorithms were followed during the patient's care in the ED.  Some ED evaluations and interventions may be delayed as a result of limited staffing during and after the pandemic.*  Note:  This document was prepared using Dragon voice recognition software and may include unintentional dictation errors.   Hinda Kehr, MD 10/06/20 [id number]

## 2020-10-06 NOTE — H&P (Addendum)
[place]   PATIENT NAME: Angela [name]    MR#:  [id number]  DATE OF BIRTH:  Mar 27, 1976  DATE OF ADMISSION:  10/06/2020  PRIMARY CARE PHYSICIAN: Gladstone Lighter, MD   Patient is coming from: Home REQUESTING/REFERRING PHYSICIAN: Hinda Kehr, MD  CHIEF COMPLAINT:   Chief Complaint  Patient presents with  . Rectal Bleeding    HISTORY OF PRESENT ILLNESS:  Angela [name] is a 45 y.o. African-American female with medical history significant for multiple medical problems that are mentioned below including asthma, depression, fibromyalgia, GERD, hypertension, diverticulosis and hypothyroidism, who presented to the ER with a [place] of bright red bleeding per rectum that started around 7 PM with associated abdominal discomfort and flatulence.  She had mild dysuria without oliguria or hematuria or flank pain.  She admits to nausea without vomiting.  She has been having chills and has not checked her temperature.  She had COVID-19 on 08/26/2020 and recovered. ED Course: When she came to the ER, blood pressure was 165/105 with otherwise normal vital signs.  Labs revealed unremarkable BMP and CBC showed leukocytosis of 12.9.  INR was 2.5 and PTT 26.3 with PTT of 99.  Influenza antigens and COVID-19 PCR came back negative. EKG as reviewed by me : showed sinus rhythm rate of 74 with low voltage QRS Imaging:Abdominal and pelvic CTA showed: VASCULAR  No active gastrointestinal hemorrhage identified.  Interval left inferior epigastric arterial embolization  NON-VASCULAR  Interval resolution of previously noted intraperitoneal hematoma.  Status post gastric sleeve resection.  Mild distal colonic diverticulosis  The patient was given 4 mg of IV morphine sulfate and 4 mg of IV Zofran as well as IV normal saline infusion.  She will be admitted to a progressive unit bed for further evaluation and management PAST MEDICAL HISTORY:   Past Medical History:  Diagnosis Date   . Anemia   . Anxiety   . Asthma   . COVID-19 08/26/2020   Tested positive on 08/26/2020  . Depression   . Fibromyalgia   . GERD (gastroesophageal reflux disease)   . History of GI diverticular bleed   . Hypertension   . Irritable bowel syndrome (IBS)   . Pseudotumor cerebri   . Pulmonary embolism ([place])   . Raynaud's disease   . Thyroid disease    hypothyroid  . Vision disturbance     PAST SURGICAL HISTORY:   Past Surgical History:  Procedure Laterality Date  . ABDOMINAL HYSTERECTOMY  02/06/13  . BREAST BIOPSY Right October, 2014   right breast core biopsy, fibroadenomatous changes  . BREAST BIOPSY Right December 2014   FNA retroareolar nodule consistent with fibroadenoma.  Marland Kitchen BREAST BIOPSY Right 12/16/2016   Fibroadenoma in the retroareolar area at 6:00.  Marland Kitchen [place]  . COLONOSCOPY  2015   Dr Allen Norris  . COLONOSCOPY WITH PROPOFOL N/A 05/27/2019   Procedure: COLONOSCOPY WITH PROPOFOL;  Surgeon: Virgel Manifold, MD;  Location: [facility] ENDOSCOPY;  Service: Endoscopy;  Laterality: N/A;  . EMBOLIZATION N/A 10/31/2019   Procedure: EMBOLIZATION;  Surgeon: Algernon Huxley, MD;  Location: [place] [facility];  Service: Cardiovascular;  Laterality: N/A;  . ESOPHAGOGASTRODUODENOSCOPY (EGD) WITH PROPOFOL N/A 05/27/2019   Procedure: ESOPHAGOGASTRODUODENOSCOPY (EGD) WITH PROPOFOL;  Surgeon: Virgel Manifold, MD;  Location: [facility] ENDOSCOPY;  Service: Endoscopy;  Laterality: N/A;  . IVC FILTER INSERTION N/A 05/29/2019   Procedure: IVC FILTER INSERTION;  Surgeon: Algernon Huxley, MD;  Location: [place] [facility];  Service: Cardiovascular;  Laterality: N/A;  . RHINOPLASTY    . TONSILECTOMY, ADENOIDECTOMY, BILATERAL MYRINGOTOMY AND TUBES  2004  . UPPER GASTROINTESTINAL ENDOSCOPY  2015   Dr Allen Norris    SOCIAL HISTORY:   Social History   Tobacco Use  . Smoking status: Never Smoker  . Smokeless tobacco: Never Used  Substance Use Topics  . Alcohol use: Yes     Comment: Occasionally Social Drinker    FAMILY HISTORY:   Family History  Problem Relation Age of Onset  . Depression Mother   . Migraines Mother   . Diverticulitis Mother   . Hypertension Mother   . Heart disease Father   . Diabetes Father   . Hypertension Father   . Stroke Sister   . Cancer Maternal Aunt   . Breast cancer Maternal Aunt   . Cancer Maternal Grandmother     DRUG ALLERGIES:   Allergies  Allergen Reactions  . Bee Venom Other (See Comments), Nausea Only, Rash and Shortness Of Breath  . Bupropion Other (See Comments)  . Lisinopril Cough  . Amoxicillin Hives    Did it involve swelling of the face/tongue/throat, SOB, or low BP? No Did it involve sudden or severe rash/hives, skin peeling, or any reaction on the inside of your mouth or nose? No Did you need to seek medical attention at a hospital or doctor's office? Unknown When did it last happen?5+ years If all above answers are "NO", may proceed with cephalosporin use.   Marcelline Mates     Break out in mouth  . Nortriptyline     Unknown reaction   . Orange Fruit [Citrus]     Break out in mouth  . Other     Sulfa eye drops- Scratched corneas  . Penicillins Hives    Did it involve swelling of the face/tongue/throat, SOB, or low BP? No Did it involve sudden or severe rash/hives, skin peeling, or any reaction on the inside of your mouth or nose? No Did you need to seek medical attention at a hospital or doctor's office? Unknown When did it last happen?5+ years If all above answers are "NO", may proceed with cephalosporin use.   . Sulfa Antibiotics   . Milnacipran Palpitations    REVIEW OF SYSTEMS:   ROS As per history of present illness. All pertinent systems were reviewed above. Constitutional, HEENT, cardiovascular, respiratory, GI, GU, musculoskeletal, neuro, psychiatric, endocrine, integumentary and hematologic systems were reviewed and are otherwise negative/unremarkable except for positive  findings mentioned above in the HPI.   MEDICATIONS AT HOME:   Prior to Admission medications   Medication Sig Start Date End Date Taking? Authorizing Provider  acetaminophen (TYLENOL) 500 MG tablet Take 1,000-1,500 mg by mouth every 6 (six) hours as needed for moderate pain or headache.    [provider]  albuterol (PROVENTIL HFA;VENTOLIN HFA) 108 (90 BASE) MCG/ACT inhaler Inhale 2 puffs into the lungs every 6 (six) hours as needed for wheezing.    [provider]  amLODipine (NORVASC) 10 MG tablet Take by mouth. 01/25/18   [provider]  budesonide (PULMICORT) 0.5 MG/2ML nebulizer solution Inhale into the lungs. 09/13/20   [provider]  budesonide (PULMICORT) 0.5 MG/2ML nebulizer solution Take 2 mLs by nebulization 2 (two) times daily. 09/13/20   [provider]  Carboxymethylcellulose Sodium (REFRESH LIQUIGEL OP) Place 1 drop into both eyes daily as needed (dry eyes).    [provider]  Cholecalciferol 125 MCG (5000 UT) TABS Take  by mouth.    [provider]  clindamycin (CLEOCIN T) 1 % lotion Apply 1 application topically 2 (two) times daily as needed (acne).    [provider]  cyclobenzaprine (FLEXERIL) 10 MG tablet Take 1 tablet (10 mg total) by mouth as needed. 04/10/19   Lucilla Lame, MD  Difluprednate 0.05 % EMUL Place 1 drop into both eyes every 6 (six) hours as needed (uveitis).     [provider]  diphenhydrAMINE (BENADRYL) 25 mg capsule Take 25 mg by mouth 2 (two) times daily as needed for allergies or sleep.     [provider]  enoxaparin (LOVENOX) 150 MG/ML injection Inject 0.8 mLs (120 mg total) into the skin every 12 (twelve) hours for 2 doses. Inject 0.8 Ml subcutaneously before long trips/travel (>4 hours) repeat for the return trip prn 02/08/20 02/09/20  Schnier, Dolores Lory, MD  EPINEPHrine 0.3 mg/0.3 mL IJ SOAJ injection Inject 0.3 mg into the muscle as needed for anaphylaxis.     [provider]  ergocalciferol (VITAMIN D2) 1.25 MG (50000 UT) capsule Take by mouth. 10/01/19   [provider]  famotidine (PEPCID) 40 MG tablet Take 40 mg by mouth daily. 05/17/20   [provider]  fluticasone (FLONASE) 50 MCG/ACT nasal spray Place 1 spray into both nostrils daily as needed for rhinitis.    [provider]  gabapentin (NEURONTIN) 100 MG capsule Take by mouth. 08/24/20   [provider]  Galcanezumab-gnlm 120 MG/ML SOAJ Inject 120 mg into the skin every 28 (twenty-eight) days.  01/30/19   [provider]  hydrocortisone 2.5 % cream Apply topically. 06/03/20   [provider]  levothyroxine (SYNTHROID) 137 MCG tablet Take 137 mcg by mouth daily. 09/24/20   [provider]  levothyroxine (SYNTHROID) 150 MCG tablet Take 150 mcg by mouth daily before breakfast.     [provider]  linaclotide (LINZESS) 72 MCG capsule Take 72 mcg by mouth daily as needed (constipation).    [provider]  loratadine (CLARITIN) 10 MG tablet Take 10 mg by mouth daily.    [provider]  meclizine (ANTIVERT) 25 MG tablet Take by mouth. 06/24/20   [provider]  Melatonin 3 MG CAPS Take 3 mg by mouth at bedtime as needed (sleep).    [provider]  montelukast (SINGULAIR) 10 MG tablet Take by mouth. 01/25/18   [provider]  Multiple Vitamin (MULTIVITAMIN WITH MINERALS) TABS tablet Take 1 tablet by mouth daily.    [provider]  mupirocin ointment (BACTROBAN) 2 % SMARTSIG:1 Application Topical 2-3 Times Daily 04/10/20   [provider]  nystatin (MYCOSTATIN/NYSTOP) powder Apply topically. 11/13/19 11/12/20  [provider]  ondansetron (ZOFRAN) 8 MG tablet Take 8 mg by mouth every 8 (eight) hours as needed for nausea or vomiting.    [provider]  oxybutynin (DITROPAN) 5 MG tablet Take 5 mg by mouth at bedtime.    [provider]  oxybutynin  (DITROPAN-XL) 5 MG 24 hr tablet Take 5 mg by mouth daily. 08/08/20   [provider]  pantoprazole (PROTONIX) 40 MG tablet Take by mouth. 01/22/20 01/21/21  [provider]  polyethylene glycol (MIRALAX / GLYCOLAX) 17 g packet PLEASE SEE ATTACHED FOR DETAILED DIRECTIONS 10/10/19   [provider]  prednisoLONE acetate (PRED FORTE) 1 % ophthalmic suspension Place 1 drop into both eyes every 4 (four) hours as needed (uveitis).     [provider]  promethazine (  PHENERGAN) 25 MG tablet Take 25 mg by mouth every 6 (six) hours as needed for nausea or vomiting.    [provider]  rosuvastatin (CRESTOR) 20 MG tablet Take 20 mg by mouth daily.    [provider]  Semaglutide,0.25 or 0.5MG /DOS, 2 MG/1.5ML SOPN Inject into the skin. 08/12/20 10/11/20  [provider]  sodium fluoride (FLUORISHIELD) 1.1 % GEL dental gel Place 1 application onto teeth at bedtime.     [provider]  tiZANidine (ZANAFLEX) 4 MG tablet Take 4 mg by mouth every 12 (twelve) hours as needed for muscle spasms.    [provider]  topiramate (TOPAMAX) 100 MG tablet Take 100 mg by mouth 2 (two) times daily. 09/15/19   [provider]  topiramate (TOPAMAX) 50 MG tablet Take 50 mg by mouth 2 (two) times daily.    [provider]  traMADol (ULTRAM) 50 MG tablet Take 1 tablet (50 mg total) by mouth every 6 (six) hours as needed for moderate pain. 05/30/19   Gouru, Illene Silver, MD  Ubrogepant 50 MG TABS Take by mouth. 06/05/20   [provider]  warfarin (COUMADIN) 3 MG tablet Take by mouth. 11/13/19 11/12/20  [provider]      VITAL SIGNS:  Blood pressure 122/75, pulse 80, temperature 98.6 F (37 C), temperature source Oral, resp. rate 17, height 5\' 3"  (1.6 m), weight 114.8 kg, SpO2 100 %.  PHYSICAL EXAMINATION:  Physical Exam  GENERAL:  45 y.o.-year-old African-American female patient lying in the bed with no acute distress.  EYES:  Pupils equal, round, reactive to light and accommodation. No scleral icterus. Extraocular muscles intact.  HEENT: Head atraumatic, normocephalic. Oropharynx and nasopharynx clear.  NECK:  Supple, no jugular venous distention. No thyroid enlargement, no tenderness.  LUNGS: Normal breath sounds bilaterally, no wheezing, rales,rhonchi or crepitation. No use of accessory muscles of respiration.  CARDIOVASCULAR: Regular rate and rhythm, S1, S2 normal. No murmurs, rubs, or gallops.  ABDOMEN: Soft, nondistended, nontender. Bowel sounds present. No organomegaly or mass.  EXTREMITIES: No pedal edema, cyanosis, or clubbing.  NEUROLOGIC: Cranial nerves II through XII are intact. Muscle strength 5/5 in all extremities. Sensation intact. Gait not checked.  PSYCHIATRIC: The patient is alert and oriented x 3.  Normal affect and good eye contact. SKIN: No obvious rash, lesion, or ulcer.   LABORATORY PANEL:   CBC Recent Labs  Lab 10/05/20 2352  WBC 11.6*  HGB 13.2  HCT 37.7  PLT 341   ------------------------------------------------------------------------------------------------------------------  Chemistries  Recent Labs  Lab 10/05/20 2352  NA 139  K 3.9  CL 109  CO2 22  GLUCOSE 87  BUN 18  CREATININE 0.81  CALCIUM 8.8*  AST 16  ALT 16  ALKPHOS 46  BILITOT 0.6   ------------------------------------------------------------------------------------------------------------------  Cardiac Enzymes No results for input(s): TROPONINI in the last 168 hours. ------------------------------------------------------------------------------------------------------------------  RADIOLOGY:  CT Angio Abd/Pel w/ and/or w/o  Result Date: 10/06/2020 CLINICAL DATA:  Gastrointestinal hemorrhage, rectal bleeding, left upper quadrant abdominal pain, fatigue EXAM: CTA ABDOMEN AND PELVIS WITHOUT AND WITH CONTRAST TECHNIQUE: Multidetector CT imaging of the abdomen and pelvis was performed using the standard  protocol during bolus administration of intravenous contrast. Multiplanar reconstructed images and MIPs were obtained and reviewed to evaluate the vascular anatomy. CONTRAST:  164mL OMNIPAQUE IOHEXOL 350 MG/ML SOLN COMPARISON:  10/31/2019 FINDINGS: VASCULAR Aorta: Unremarkable Celiac: Widely patent.  Classic anatomic configuration. SMA: Widely patent Renals: Single renal arteries are widely patent and demonstrate normal vascular morphology. No  aneurysm. IMA: Widely patent Inflow: Widely patent. Internal iliac arteries are patent bilaterally. Proximal Outflow: Minimal atherosclerotic calcification within the left common femoral artery. Widely patent bilaterally. Left inferior epigastric artery has been coil embolized in the interval. Veins: Infrarenal inferior vena cava filter is in place. The abdominal venous vasculature is otherwise unremarkable. Review of the MIP images confirms the above findings. NON-VASCULAR Lower chest: Visualized lungs are clear. Visualized heart and pericardium are unremarkable peer Hepatobiliary: No focal liver abnormality is seen. No gallstones, gallbladder wall thickening, or biliary dilatation. Pancreas: Unremarkable Spleen: Unremarkable Adrenals/Urinary Tract: Adrenal glands are unremarkable. Kidneys are normal, without renal calculi, focal lesion, or hydronephrosis. Bladder is unremarkable. Stomach/Bowel: Surgical changes of a gastric sleeve resection are identified. The stomach, small bowel, and large bowel are unremarkable save for mild descending and sigmoid colonic diverticulosis. Appendix normal. No free intraperitoneal gas or fluid. Previously noted intraperitoneal hematoma have resolved. Lymphatic: No pathologic adenopathy within the abdomen and pelvis. Reproductive: Uterus and bilateral adnexa are unremarkable. Other: Previously noted subcutaneous hematoma within the anterior abdominal wall have nearly completely resolved. Tiny umbilical fat containing hernia. Rectum  unremarkable. Musculoskeletal: No acute bone abnormality. No lytic or blastic bone lesions. IMPRESSION: VASCULAR No active gastrointestinal hemorrhage identified. Interval left inferior epigastric arterial embolization NON-VASCULAR Interval resolution of previously noted intraperitoneal hematoma. Status post gastric sleeve resection. Mild distal colonic diverticulosis Electronically Signed   By: Fidela Salisbury MD   On: 10/06/2020 03:35      IMPRESSION AND PLAN:  Active Problems:   GI bleeding 1.  GI bleeding with bright red bleeding per rectum like secondary to colonic diverticular bleeding, on anticoagulation with Coumadin. -The patient will be admitted to a monitored progressive unit bed. -We will follow serial hemoglobins and hematocrits. -GI consultation will be obtained. -I notified Dr. Marius Ditch about the patient. -Her Coumadin will obviously be held off. -Her INR will be followed and with further bleeding will reverse with IV vitamin K.  2.  Hypertensive urgency. -We will continue amlodipine and place her on as needed IV labetalol.  3.  Hypothyroidism. -We will continue Synthroid and check TSH level.  4.  Type 2 diabetes mellitus. -We will continue her semaglutide and placed on supplement coverage with NovoLog.  5.  Dyslipidemia. -We will continue statin therapy.  6.  Overactive bladder. -We will continue Ditropan XL.  7.  Asthma. -We will continue Pulmicort and place on as needed duo nebs.   DVT prophylaxis: SCDs.  Medical prophylaxis currently contraindicated due to GI bleeding. Code Status: full code. Family Communication:  The plan of care was discussed in details with the patient who requested no further family communication at this time. I answered all questions. The patient agreed to proceed with the above mentioned plan. Further management will depend upon hospital course. Disposition Plan: Back to previous home environment Consults called: GI consultation to Dr.  Marius Ditch. All the records are reviewed and case discussed with ED provider.  Status is: Inpatient  Remains inpatient appropriate because:Ongoing diagnostic testing needed not appropriate for outpatient work up, Unsafe d/c plan, IV treatments appropriate due to intensity of illness or inability to take PO and Inpatient level of care appropriate due to severity of illness   Dispo: The patient is from: Home              Anticipated d/c is to: Home              Patient currently is not medically stable to d/c.  Difficult to place patient No   TOTAL TIME TAKING CARE OF THIS PATIENT: 55 minutes.    Christel Mormon M.D on 10/06/2020 at 4:04 AM  [facility]   From 7 PM-7 AM, contact night-coverage [email or web address]  CC: Primary care physician; Gladstone Lighter, MD

## 2020-10-06 NOTE — Progress Notes (Signed)
Subjective:    Patient ID: Angela [name], female    DOB: [birth year], 45 y.o.   MRN: [id number] Chief Complaint  Patient presents with  . Follow-up    U/S    The patient presents today for discussion about removal of IVC filter.  The patient has had previous recurrent pulmonary embolism and IVC filter was placed following bleeding after bariatric surgery.  Since that time the patient has been dealing with other medical issues as well as family requirements.  The patient is currently on Coumadin for anticoagulation however there has been difficulty with getting it regulated.  The patient has not had any extended timeframes to where she was in therapeutic range.  The patient tends to be subtherapeutic.  Today, the patient had noninvasive studies which showed a patent IVC as well as iliac veins.  There is no presence of thrombus.   Review of Systems  Cardiovascular: Positive for leg swelling.  Hematological: Bruises/bleeds easily.  All other systems reviewed and are negative.      Objective:   Physical Exam Vitals reviewed.  HENT:     Head: Normocephalic.  Cardiovascular:     Rate and Rhythm: Normal rate and regular rhythm.  Pulmonary:     Effort: Pulmonary effort is normal.  Neurological:     Mental Status: She is alert and oriented to person, place, and time.  Psychiatric:        Mood and Affect: Mood normal.        Behavior: Behavior normal.        Thought Content: Thought content normal.        Judgment: Judgment normal.     BP (!) 145/83   Pulse 70   Ht 5\' 3"  (1.6 m)   Wt 254 lb (115.2 kg)   BMI 44.99 kg/m   Past Medical History:  Diagnosis Date  . Anemia   . Anxiety   . Asthma   . COVID-19 08/26/2020   Tested positive on 08/26/2020  . Depression   . Fibromyalgia   . GERD (gastroesophageal reflux disease)   . History of GI diverticular bleed   . Hypertension   . Irritable bowel syndrome (IBS)   . Pseudotumor cerebri   . Pulmonary embolism ([place])   .  Raynaud's disease   . Thyroid disease    hypothyroid  . Vision disturbance     Social History   Socioeconomic History  . Marital status: Divorced    Spouse name: Not on file  . Number of children: 3  . Years of education: Not on file  . Highest education level: Not on file  Occupational History  . Not on file  Tobacco Use  . Smoking status: Never Smoker  . Smokeless tobacco: Never Used  Vaping Use  . Vaping Use: Never used  Substance and Sexual Activity  . Alcohol use: Yes    Comment: Occasionally Social Drinker  . Drug use: No  . Sexual activity: Yes  Other Topics Concern  . Not on file  Social History Narrative  . Not on file   Social Determinants of Health   Financial Resource Strain: Not on file  Food Insecurity: Not on file  Transportation Needs: Not on file  Physical Activity: Not on file  Stress: Not on file  Social Connections: Not on file  Intimate Partner Violence: Not on file    Past Surgical History:  Procedure Laterality Date  . ABDOMINAL HYSTERECTOMY  02/06/13  . BREAST BIOPSY Right October,  2014   right breast core biopsy, fibroadenomatous changes  . BREAST BIOPSY Right December 2014   FNA retroareolar nodule consistent with fibroadenoma.  Marland Kitchen BREAST BIOPSY Right 12/16/2016   Fibroadenoma in the retroareolar area at 6:00.  Marland Kitchen [place]  . COLONOSCOPY  2015   Dr Allen Norris  . COLONOSCOPY WITH PROPOFOL N/A 05/27/2019   Procedure: COLONOSCOPY WITH PROPOFOL;  Surgeon: Virgel Manifold, MD;  Location: [facility];  Service: Endoscopy;  Laterality: N/A;  . EMBOLIZATION N/A 10/31/2019   Procedure: EMBOLIZATION;  Surgeon: Algernon Huxley, MD;  Location: [place] [facility];  Service: Cardiovascular;  Laterality: N/A;  . ESOPHAGOGASTRODUODENOSCOPY (EGD) WITH PROPOFOL N/A 05/27/2019   Procedure: ESOPHAGOGASTRODUODENOSCOPY (EGD) WITH PROPOFOL;  Surgeon: Virgel Manifold, MD;  Location: [facility];  Service: Endoscopy;   Laterality: N/A;  . IVC FILTER INSERTION N/A 05/29/2019   Procedure: IVC FILTER INSERTION;  Surgeon: Algernon Huxley, MD;  Location: [place] [facility];  Service: Cardiovascular;  Laterality: N/A;  . RHINOPLASTY    . TONSILECTOMY, ADENOIDECTOMY, BILATERAL MYRINGOTOMY AND TUBES  2004  . UPPER GASTROINTESTINAL ENDOSCOPY  2015   Dr Allen Norris    Family History  Problem Relation Age of Onset  . Depression Mother   . Migraines Mother   . Diverticulitis Mother   . Hypertension Mother   . Heart disease Father   . Diabetes Father   . Hypertension Father   . Stroke Sister   . Cancer Maternal Aunt   . Breast cancer Maternal Aunt   . Cancer Maternal Grandmother     Allergies  Allergen Reactions  . Bee Venom Other (See Comments), Nausea Only, Rash and Shortness Of Breath  . Bupropion Other (See Comments)  . Lisinopril Cough  . Amoxicillin Hives    Did it involve swelling of the face/tongue/throat, SOB, or low BP? No Did it involve sudden or severe rash/hives, skin peeling, or any reaction on the inside of your mouth or nose? No Did you need to seek medical attention at a hospital or doctor's office? Unknown When did it last happen?5+ years If all above answers are "NO", may proceed with cephalosporin use.   Marcelline Mates     Break out in mouth  . Nortriptyline     Unknown reaction   . Orange Fruit [Citrus]     Break out in mouth  . Other     Sulfa eye drops- Scratched corneas  . Penicillins Hives    Did it involve swelling of the face/tongue/throat, SOB, or low BP? No Did it involve sudden or severe rash/hives, skin peeling, or any reaction on the inside of your mouth or nose? No Did you need to seek medical attention at a hospital or doctor's office? Unknown When did it last happen?5+ years If all above answers are "NO", may proceed with cephalosporin use.   . Sulfa Antibiotics   . Milnacipran Palpitations    CBC Latest Ref Rng & Units 10/06/2020 10/06/2020 10/06/2020  WBC  4.0 - 10.5 K/uL - - 10.5  Hemoglobin 12.0 - 15.0 g/dL 12.5 11.7(L) 11.9(L)  Hematocrit 36.0 - 46.0 % 39.7 35.4(L) 35.6(L)  Platelets 150 - 400 K/uL - - 332      CMP     Component Value Date/Time   NA 137 10/06/2020 0434   NA 140 02/07/2013 0901   K 4.0 10/06/2020 0434   K 3.7 02/07/2013 0901   CL 107 10/06/2020 0434   CL 110 (H) 02/07/2013  0901   CO2 22 10/06/2020 0434   CO2 25 02/07/2013 0901   GLUCOSE 81 10/06/2020 0434   GLUCOSE 125 (H) 02/07/2013 0901   BUN 15 10/06/2020 0434   BUN 7 02/07/2013 0901   CREATININE 0.89 10/06/2020 0434   CREATININE 1.01 02/07/2013 0901   CALCIUM 8.4 (L) 10/06/2020 0434   CALCIUM 8.7 02/07/2013 0901   PROT 7.1 10/05/2020 2352   PROT 7.6 03/18/2012 1733   ALBUMIN 3.7 10/05/2020 2352   ALBUMIN 3.4 03/22/2013 0724   AST 16 10/05/2020 2352   AST 12 (L) 03/18/2012 1733   ALT 16 10/05/2020 2352   ALT 14 03/18/2012 1733   ALKPHOS 46 10/05/2020 2352   ALKPHOS 51 03/18/2012 1733   BILITOT 0.6 10/05/2020 2352   BILITOT 0.3 03/18/2012 1733   GFRNONAA >60 10/06/2020 0434   GFRNONAA >60 02/07/2013 0901   GFRAA 44 (L) 10/31/2019 1757   GFRAA >60 02/07/2013 0901     No results found.     Assessment & Plan:   1. 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HCC) We discussed removal of the patient's IVC filter today.  Currently the patient is on Coumadin and her INR has not been stable.  The patient is fearful that she could develop a recurrent pulmonary embolism while her INR is unstable.  This is certainly reasonable.  We discussed the risk of leaving the IVC filter in place including not being able to remove it at a later date as well as filter occlusion.  Typically the risk of filter occlusion is relatively low.  We will have the patient return to the office in 6 months to reevaluate removal of her IVC filter.  2. Essential (primary) hypertension Continue antihypertensive medications as already ordered, these medications have been reviewed and there are no  changes at this time.   3. Mixed hyperlipidemia Continue statin as ordered and reviewed, no changes at this time   4. Lymphedema Today the patient's IVC and iliac veins were patent with no evidence of thrombus.  Patient's lymphedema likely resultant from previous thrombotic issues.  The patient also has a number of other comorbidities that also can increase likelihood of lymphedema.  Patient is advised to wear medical grade 1 compression stockings daily and elevate her lower extremities when possible.  We will reevaluate patient's lower extremity edema in 6 months or sooner if issues should arise.   No current facility-administered medications on file prior to visit.   Current Outpatient Medications on File Prior to Visit  Medication Sig Dispense Refill  . acetaminophen (TYLENOL) 500 MG tablet Take 1,000-1,500 mg by mouth every 6 (six) hours as needed for moderate pain or headache.    . albuterol (PROVENTIL HFA;VENTOLIN HFA) 108 (90 BASE) MCG/ACT inhaler Inhale 2 puffs into the lungs every 6 (six) hours as needed for wheezing.    Marland Kitchen amLODipine (NORVASC) 10 MG tablet Take by mouth. (Patient not taking: No sig reported)    . budesonide (PULMICORT) 0.5 MG/2ML nebulizer solution Inhale into the lungs. (Patient not taking: No sig reported)    . budesonide (PULMICORT) 0.5 MG/2ML nebulizer solution Take 2 mLs by nebulization 2 (two) times daily.    . Carboxymethylcellulose Sodium (REFRESH LIQUIGEL OP) Place 1 drop into both eyes daily as needed (dry eyes).    . Cholecalciferol 125 MCG (5000 UT) TABS Take 1 tablet by mouth daily.    . clindamycin (CLEOCIN T) 1 % lotion Apply 1 application topically 2 (two) times daily as needed (acne).    Marland Kitchen  cyclobenzaprine (FLEXERIL) 10 MG tablet Take 1 tablet (10 mg total) by mouth as needed. 30 tablet 3  . Difluprednate 0.05 % EMUL Place 1 drop into both eyes every 6 (six) hours as needed (uveitis).  (Patient not taking: Reported on 10/06/2020)    . diphenhydrAMINE  (BENADRYL) 25 mg capsule Take 25 mg by mouth 2 (two) times daily as needed for allergies or sleep.     Marland Kitchen EPINEPHrine 0.3 mg/0.3 mL IJ SOAJ injection Inject 0.3 mg into the muscle as needed for anaphylaxis.     Marland Kitchen ergocalciferol (VITAMIN D2) 1.25 MG (50000 UT) capsule Take by mouth. (Patient not taking: No sig reported)    . famotidine (PEPCID) 40 MG tablet Take 40 mg by mouth at bedtime.    . fluticasone (FLONASE) 50 MCG/ACT nasal spray Place 1 spray into both nostrils daily as needed for rhinitis. (Patient not taking: Reported on 10/06/2020)    . gabapentin (NEURONTIN) 300 MG capsule Take 300 mg by mouth 3 (three) times daily.    . Galcanezumab-gnlm 120 MG/ML SOAJ Inject 120 mg into the skin every 28 (twenty-eight) days.     . hydrocortisone 2.5 % cream Apply topically. (Patient not taking: No sig reported)    . levothyroxine (SYNTHROID) 137 MCG tablet Take 137 mcg by mouth daily.    Marland Kitchen levothyroxine (SYNTHROID) 150 MCG tablet Take 150 mcg by mouth daily before breakfast.  (Patient not taking: Reported on 10/06/2020)    . linaclotide (LINZESS) 72 MCG capsule Take 72 mcg by mouth daily as needed (constipation). (Patient not taking: Reported on 10/06/2020)    . loratadine (CLARITIN) 10 MG tablet Take 10 mg by mouth daily.    . meclizine (ANTIVERT) 25 MG tablet Take 25 mg by mouth as needed.    . Melatonin 3 MG CAPS Take 3 mg by mouth at bedtime as needed (sleep).    . montelukast (SINGULAIR) 10 MG tablet Take 10 mg by mouth at bedtime.    . Multiple Vitamin (MULTIVITAMIN WITH MINERALS) TABS tablet Take 1 tablet by mouth daily.    . mupirocin ointment (BACTROBAN) 2 % SMARTSIG:1 Application Topical 2-3 Times Daily (Patient not taking: No sig reported)    . nystatin (MYCOSTATIN/NYSTOP) powder Apply topically. (Patient not taking: No sig reported)    . ondansetron (ZOFRAN) 8 MG tablet Take 8 mg by mouth every 8 (eight) hours as needed for nausea or vomiting.    Marland Kitchen oxybutynin (DITROPAN) 5 MG tablet Take 5 mg  by mouth at bedtime.    Marland Kitchen oxybutynin (DITROPAN-XL) 5 MG 24 hr tablet Take 5 mg by mouth daily. (Patient not taking: No sig reported)    . pantoprazole (PROTONIX) 40 MG tablet Take by mouth daily.    . polyethylene glycol (MIRALAX / GLYCOLAX) 17 g packet PLEASE SEE ATTACHED FOR DETAILED DIRECTIONS (Patient not taking: No sig reported)    . prednisoLONE acetate (PRED FORTE) 1 % ophthalmic suspension Place 1 drop into both eyes every 4 (four) hours as needed (uveitis).     . promethazine (PHENERGAN) 25 MG tablet Take 25 mg by mouth every 6 (six) hours as needed for nausea or vomiting.    . rosuvastatin (CRESTOR) 20 MG tablet Take 20 mg by mouth daily.    . Semaglutide,0.25 or 0.5MG /DOS, 2 MG/1.5ML SOPN Inject 0.5 mg into the skin once a week. (Wednesday)    . sodium fluoride (FLUORISHIELD) 1.1 % GEL dental gel Place 1 application onto teeth at bedtime.     Marland Kitchen  tiZANidine (ZANAFLEX) 4 MG tablet Take 4 mg by mouth every 12 (twelve) hours as needed for muscle spasms.    Marland Kitchen topiramate (TOPAMAX) 100 MG tablet Take 100 mg by mouth 2 (two) times daily. (Patient not taking: No sig reported)    . topiramate (TOPAMAX) 50 MG tablet Take 50 mg by mouth 2 (two) times daily. (Patient not taking: Reported on 10/06/2020)    . traMADol (ULTRAM) 50 MG tablet Take 1 tablet (50 mg total) by mouth every 6 (six) hours as needed for moderate pain. 12 tablet 0  . Ubrogepant 50 MG TABS Take 1 tablet by mouth as needed. May repeat in 2 hours    . warfarin (COUMADIN) 3 MG tablet Take by mouth. (Patient not taking: No sig reported)    . enoxaparin (LOVENOX) 150 MG/ML injection Inject 0.8 mLs (120 mg total) into the skin every 12 (twelve) hours for 2 doses. Inject 0.8 Ml subcutaneously before long trips/travel (>4 hours) repeat for the return trip prn 1.6 mL 0    There are no Patient Instructions on file for this visit. No follow-ups on file.   Kris Hartmann, NP

## 2020-10-06 NOTE — Consult Note (Signed)
Cephas Darby, MD [address]  [place], [place] [address]  Main: [phone]  Fax: [phone] Pager: [phone]   Consultation  Referring Provider:     No ref. provider found Primary Care Physician:  Gladstone Lighter, MD Primary Gastroenterologist:  Dr. Lucilla Lame         Reason for Consultation:     Rectal bleeding  Date of Admission:  10/06/2020 Date of Consultation:  10/06/2020         HPI:   Angela [name] is a 45 y.o. 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  Patient denies any constipation or abdominal pain or nausea or vomiting.  Patient had a small bowel movement this afternoon associated with a dime sized blood clot only.  Her hemoglobin yesterday was 13.2, dropped to 11.7 within last 24 hours. INR was 2.6 on admission, 2.5 today. Patient underwent CT angio abdomen and pelvis on admission which did not reveal active bleeding source  Patient underwent bidirectional endoscopy in 05/2019 secondary to hematochezia when she was admitted to [facility].  She was found to have internal hemorrhoids which was thought to be the source of bleeding  CT angio chest PE protocol on 09/03/2020 did not reveal recurrence of PE  NSAIDs: None  Antiplts/Anticoagulants/Anti thrombotics: Coumadin for history of recurrent PE  GI Procedures:  Upper endoscopy and colonoscopy 05/27/2019 - No evidence of active or recent bleeding seen throughout the exam. - Normal esophagus. - Non-bleeding erosive gastropathy. - Normal duodenal bulb, second portion of the duodenum and examined duodenum. - No specimens collected.  - No evidence of active or recent bleeding seen throughout the exam. - Diverticulosis in the sigmoid colon. - Non-bleeding internal hemorrhoids. - The examination was otherwise normal. - The  rectum, sigmoid colon, descending colon, transverse colon, ascending colon, cecum and terminal ileum are normal. - The distal rectum and anal verge are normal on retroflexion view. - No specimens collected. - The source of patient's bleeding were likely the internal hemorrhoids.   Past Medical History:  Diagnosis Date  . Anemia   . Anxiety   . Asthma   . COVID-19 08/26/2020   Tested positive on 08/26/2020  . Depression   . Fibromyalgia   . GERD (gastroesophageal reflux disease)   . History of GI diverticular bleed   . Hypertension   . Irritable bowel syndrome (IBS)   . Pseudotumor cerebri   . Pulmonary embolism ([place])   . Raynaud's disease   . Thyroid disease    hypothyroid  . Vision disturbance     Past Surgical History:  Procedure Laterality Date  . ABDOMINAL HYSTERECTOMY  02/06/13  . BREAST BIOPSY Right October, 2014   right breast core biopsy, fibroadenomatous changes  . BREAST BIOPSY Right December 2014   FNA retroareolar nodule consistent with fibroadenoma.  Marland Kitchen BREAST BIOPSY Right 12/16/2016   Fibroadenoma in the retroareolar area at 6:00.  Marland Kitchen [place]  . COLONOSCOPY  2015   Dr Allen Norris  . COLONOSCOPY WITH PROPOFOL N/A 05/27/2019   Procedure: COLONOSCOPY WITH PROPOFOL;  Surgeon: Virgel Manifold, MD;  Location: [facility] ENDOSCOPY;  Service: Endoscopy;  Laterality: N/A;  . EMBOLIZATION N/A 10/31/2019   Procedure: EMBOLIZATION;  Surgeon: Algernon Huxley, MD;  Location: [place] [facility];  Service: Cardiovascular;  Laterality: N/A;  .  ESOPHAGOGASTRODUODENOSCOPY (EGD) WITH PROPOFOL N/A 05/27/2019   Procedure: ESOPHAGOGASTRODUODENOSCOPY (EGD) WITH PROPOFOL;  Surgeon: Virgel Manifold, MD;  Location: [facility] ENDOSCOPY;  Service: Endoscopy;  Laterality: N/A;  . IVC FILTER INSERTION N/A 05/29/2019   Procedure: IVC FILTER INSERTION;  Surgeon: Algernon Huxley, MD;  Location: [place] [facility];  Service: Cardiovascular;  Laterality: N/A;  . RHINOPLASTY     . TONSILECTOMY, ADENOIDECTOMY, BILATERAL MYRINGOTOMY AND TUBES  2004  . UPPER GASTROINTESTINAL ENDOSCOPY  2015   Dr Allen Norris    Prior to Admission medications   Medication Sig Start Date End Date Taking? Authorizing Provider  acetaminophen (TYLENOL) 500 MG tablet Take 1,000-1,500 mg by mouth every 6 (six) hours as needed for moderate pain or headache.   Yes [provider]  albuterol (PROVENTIL HFA;VENTOLIN HFA) 108 (90 BASE) MCG/ACT inhaler Inhale 2 puffs into the lungs every 6 (six) hours as needed for wheezing.   Yes [provider]  budesonide (PULMICORT) 0.5 MG/2ML nebulizer solution Take 2 mLs by nebulization 2 (two) times daily. 09/13/20  Yes [provider]  Carboxymethylcellulose Sodium (REFRESH LIQUIGEL OP) Place 1 drop into both eyes daily as needed (dry eyes).   Yes [provider]  Cholecalciferol 125 MCG (5000 UT) TABS Take 1 tablet by mouth daily.   Yes [provider]  clindamycin (CLEOCIN T) 1 % lotion Apply 1 application topically 2 (two) times daily as needed (acne).   Yes [provider]  cyclobenzaprine (FLEXERIL) 10 MG tablet Take 1 tablet (10 mg total) by mouth as needed. 04/10/19  Yes Lucilla Lame, MD  diphenhydrAMINE (BENADRYL) 25 mg capsule Take 25 mg by mouth 2 (two) times daily as needed for allergies or sleep.    Yes [provider]  EPINEPHrine 0.3 mg/0.3 mL IJ SOAJ injection Inject 0.3 mg into the muscle as needed for anaphylaxis.    Yes [provider]  famotidine (PEPCID) 40 MG tablet Take 40 mg by mouth at bedtime. 05/17/20  Yes [provider]  gabapentin (NEURONTIN) 300 MG capsule Take 300 mg by mouth 3 (three) times daily. 08/24/20  Yes [provider]  Galcanezumab-gnlm 120 MG/ML SOAJ Inject 120 mg into the skin every 28 (twenty-eight) days.  01/30/19  Yes [provider]  levothyroxine (SYNTHROID) 137 MCG tablet Take 137 mcg by mouth daily. 09/24/20  Yes [provider]  loratadine (CLARITIN) 10 MG tablet Take 10 mg by mouth daily.   Yes [provider]  meclizine (ANTIVERT) 25 MG tablet Take 25 mg by mouth as needed. 06/24/20  Yes [provider]  Melatonin 3 MG CAPS Take 3 mg by mouth at bedtime as needed (sleep).   Yes [provider]  montelukast (SINGULAIR) 10 MG tablet Take 10 mg by mouth at bedtime. 01/25/18  Yes [provider]  Multiple Vitamin (MULTIVITAMIN WITH MINERALS) TABS tablet Take 1 tablet by mouth daily.   Yes [provider]  ondansetron (ZOFRAN) 8 MG tablet Take 8 mg by mouth every 8 (eight) hours as needed for nausea or vomiting.   Yes [provider]  oxybutynin (DITROPAN) 5 MG tablet Take 5 mg by mouth at bedtime.   Yes [provider]  pantoprazole (PROTONIX) 40 MG tablet Take by mouth daily. 01/22/20 01/21/21 Yes [provider]  prednisoLONE acetate (PRED FORTE) 1 % ophthalmic suspension Place 1 drop into both eyes every 4 (four) hours as needed (uveitis).    Yes [provider]  promethazine (PHENERGAN) 25 MG  tablet Take 25 mg by mouth every 6 (six) hours as needed for nausea or vomiting.   Yes [provider]  rosuvastatin (CRESTOR) 20 MG tablet Take 20 mg by mouth daily.   Yes [provider]  Semaglutide,0.25 or 0.5MG/DOS, 2 MG/1.5ML SOPN Inject 0.5 mg into the skin once a week. (Wednesday) 08/12/20 10/11/20 Yes [provider]  sodium fluoride (FLUORISHIELD) 1.1 % GEL dental gel Place 1 application onto teeth at bedtime.    Yes [provider]  tiZANidine (ZANAFLEX) 4 MG tablet Take 4 mg by mouth every 12 (twelve) hours as needed for muscle spasms.   Yes [provider]  traMADol (ULTRAM) 50 MG tablet Take 1 tablet (50 mg total) by mouth every 6 (six) hours as needed for moderate pain. 05/30/19  Yes Gouru, Aruna, MD  Ubrogepant 50 MG TABS Take 1 tablet by mouth as needed. May repeat in 2 hours 06/05/20   Yes [provider]  amLODipine (NORVASC) 10 MG tablet Take by mouth. Patient not taking: No sig reported 01/25/18   [provider]  budesonide (PULMICORT) 0.5 MG/2ML nebulizer solution Inhale into the lungs. Patient not taking: No sig reported 09/13/20   [provider]  Difluprednate 0.05 % EMUL Place 1 drop into both eyes every 6 (six) hours as needed (uveitis).  Patient not taking: Reported on 10/06/2020    [provider]  enoxaparin (LOVENOX) 150 MG/ML injection Inject 0.8 mLs (120 mg total) into the skin every 12 (twelve) hours for 2 doses. Inject 0.8 Ml subcutaneously before long trips/travel (>4 hours) repeat for the return trip prn 02/08/20 02/09/20  Schnier, Dolores Lory, MD  ergocalciferol (VITAMIN D2) 1.25 MG (50000 UT) capsule Take by mouth. Patient not taking: No sig reported 10/01/19   [provider]  fluticasone (FLONASE) 50 MCG/ACT nasal spray Place 1 spray into both nostrils daily as needed for rhinitis. Patient not taking: Reported on 10/06/2020    [provider]  hydrocortisone 2.5 % cream Apply topically. Patient not taking: No sig reported 06/03/20   [provider]  levothyroxine (SYNTHROID) 150 MCG tablet Take 150 mcg by mouth daily before breakfast.  Patient not taking: Reported on 10/06/2020    [provider]  linaclotide (LINZESS) 72 MCG capsule Take 72 mcg by mouth daily as needed (constipation). Patient not taking: Reported on 10/06/2020    [provider]  mupirocin ointment (BACTROBAN) 2 % SMARTSIG:1 Application Topical 2-3 Times Daily Patient not taking: No sig reported 04/10/20   [provider]  nystatin (MYCOSTATIN/NYSTOP) powder Apply topically. Patient not taking: No sig reported 11/13/19 11/12/20  [provider]  oxybutynin (DITROPAN-XL) 5 MG 24 hr tablet Take 5 mg by mouth daily. Patient not taking: No sig reported 08/08/20   [provider]  polyethylene glycol  (MIRALAX / GLYCOLAX) 17 g packet PLEASE SEE ATTACHED FOR DETAILED DIRECTIONS Patient not taking: No sig reported 10/10/19   [provider]  topiramate (TOPAMAX) 100 MG tablet Take 100 mg by mouth 2 (two) times daily. Patient not taking: No sig reported 09/15/19   [provider]  topiramate (TOPAMAX) 50 MG tablet Take 50 mg by mouth 2 (two) times daily. Patient not taking: Reported on 10/06/2020    [provider]  warfarin (COUMADIN) 3 MG tablet Take by mouth. Patient not taking: No sig reported 11/13/19 11/12/20  [provider]    Current Facility-Administered Medications:  .  0.9 %  sodium chloride infusion, , Intravenous, Continuous,  Mansy, Jan A, MD, Last Rate: 100 mL/hr at 10/06/20 0600, Infusion Verify at 10/06/20 0600 .  acetaminophen (TYLENOL) tablet 650 mg, 650 mg, Oral, Q6H PRN, 650 mg at 10/06/20 1629 **OR** acetaminophen (TYLENOL) suppository 650 mg, 650 mg, Rectal, Q6H PRN, Mansy, Jan A, MD .  albuterol (VENTOLIN HFA) 108 (90 Base) MCG/ACT inhaler 2 puff, 2 puff, Inhalation, Q6H PRN, Mansy, Jan A, MD .  budesonide (PULMICORT) nebulizer solution 0.5 mg, 0.5 mg, Nebulization, Daily, Mansy, Jan A, MD, 0.5 mg at 10/06/20 7062 .  cyclobenzaprine (FLEXERIL) tablet 10 mg, 10 mg, Oral, TID PRN, Mansy, Jan A, MD .  EPINEPHrine (EPI-PEN) injection 0.3 mg, 0.3 mg, Intramuscular, PRN, Mansy, Jan A, MD .  famotidine (PEPCID) tablet 40 mg, 40 mg, Oral, Daily, Mansy, Jan A, MD, 40 mg at 10/06/20 0844 .  fluticasone (FLONASE) 50 MCG/ACT nasal spray 1 spray, 1 spray, Each Nare, Daily PRN, Mansy, Jan A, MD .  gabapentin (NEURONTIN) capsule 100 mg, 100 mg, Oral, QHS, Mansy, Jan A, MD .  insulin aspart (novoLOG) injection 0-9 Units, 0-9 Units, Subcutaneous, TID PC & HS, Mansy, Jan A, MD .  ipratropium-albuterol (DUONEB) 0.5-2.5 (3) MG/3ML nebulizer solution 3 mL, 3 mL, Nebulization, Q4H PRN, Mansy, Jan A, MD, 3 mL at 10/06/20 3762 .  labetalol (NORMODYNE) injection 20 mg,  20 mg, Intravenous, Q3H PRN, Mansy, Jan A, MD .  levothyroxine (SYNTHROID) tablet 137 mcg, 137 mcg, Oral, Daily, Mansy, Jan A, MD, 137 mcg at 10/06/20 [phone] .  linaclotide (LINZESS) capsule 72 mcg, 72 mcg, Oral, Daily PRN, Mansy, Jan A, MD .  loratadine (CLARITIN) tablet 10 mg, 10 mg, Oral, Daily, Mansy, Jan A, MD, 10 mg at 10/06/20 0843 .  meclizine (ANTIVERT) tablet 12.5 mg, 12.5 mg, Oral, TID PRN, Mansy, Jan A, MD .  melatonin tablet 3 mg, 3 mg, Oral, QHS PRN, Mansy, Jan A, MD .  montelukast (SINGULAIR) tablet 10 mg, 10 mg, Oral, QHS, Mansy, Jan A, MD .  morphine 2 MG/ML injection 2 mg, 2 mg, Intravenous, Q4H PRN, Mansy, Jan A, MD, 2 mg at 10/06/20 1049 .  multivitamin with minerals tablet 1 tablet, 1 tablet, Oral, Daily, Mansy, Jan A, MD, 1 tablet at 10/06/20 0844 .  ondansetron (ZOFRAN) tablet 4 mg, 4 mg, Oral, Q6H PRN **OR** ondansetron (ZOFRAN) injection 4 mg, 4 mg, Intravenous, Q6H PRN, Mansy, Jan A, MD, 4 mg at 10/06/20 1629 .  oxybutynin (DITROPAN-XL) 24 hr tablet 5 mg, 5 mg, Oral, QHS, Mansy, Jan A, MD .  pantoprazole (PROTONIX) EC tablet 40 mg, 40 mg, Oral, BID, Mansy, Jan A, MD, 40 mg at 10/06/20 0843 .  polyvinyl alcohol (LIQUIFILM TEARS) 1.4 % ophthalmic solution, , Both Eyes, Daily PRN, Mansy, Jan A, MD .  prednisoLONE acetate (PRED FORTE) 1 % ophthalmic suspension 1 drop, 1 drop, Both Eyes, Q4H PRN, Mansy, Jan A, MD .  rosuvastatin (CRESTOR) tablet 20 mg, 20 mg, Oral, Daily, Mansy, Jan A, MD, 20 mg at 10/06/20 0844 .  tiZANidine (ZANAFLEX) tablet 4 mg, 4 mg, Oral, Q12H PRN, Mansy, Jan A, MD .  traMADol Veatrice Bourbon) tablet 50 mg, 50 mg, Oral, Q6H PRN, Mansy, Jan A, MD, 50 mg at 10/06/20 0843 .  traZODone (DESYREL) tablet 25 mg, 25 mg, Oral, QHS PRN, Mansy, Arvella Merles, MD  Family History  Problem Relation Age of Onset  . Depression Mother   . Migraines Mother   . Diverticulitis Mother   . Hypertension Mother   . Heart disease Father   .  Diabetes Father   . Hypertension Father   .  Stroke Sister   . Cancer Maternal Aunt   . Breast cancer Maternal Aunt   . Cancer Maternal Grandmother      Social History   Tobacco Use  . Smoking status: Never Smoker  . Smokeless tobacco: Never Used  Vaping Use  . Vaping Use: Never used  Substance Use Topics  . Alcohol use: Yes    Comment: Occasionally Social Drinker  . Drug use: No    Allergies as of 10/05/2020 - Review Complete 10/05/2020  Allergen Reaction Noted  . Bee venom Other (See Comments), Nausea Only, Rash, and Shortness Of Breath 10/23/2019  . Bupropion Other (See Comments) 10/11/2019  . Lisinopril Cough 11/26/2016  . Amoxicillin Hives 05/29/2013  . Zalma  05/25/2019  . Nortriptyline  12/20/2018  . Orange fruit [citrus]  05/25/2019  . Other  05/29/2013  . Penicillins Hives 05/29/2013  . Sulfa antibiotics  12/20/2018  . Milnacipran Palpitations 09/07/2019    Review of Systems:    All systems reviewed and negative except where noted in HPI.   Physical Exam:  Vital signs in last 24 hours: Temp:  [97.6 F (36.4 C)-98.6 F (37 C)] 98 F (36.7 C) (02/27 1633) Pulse Rate:  [66-89] 68 (02/27 1633) Resp:  [11-18] 18 (02/27 1633) BP: (122-165)/(75-105) 135/97 (02/27 1633) SpO2:  [97 %-100 %] 99 % (02/27 1633) Weight:  [114.8 kg] 114.8 kg (02/26 2326) Last BM Date: 10/06/20 General:   Pleasant, cooperative in NAD Head:  Normocephalic and atraumatic. Eyes:   No icterus.   Conjunctiva pink. PERRLA. Ears:  Normal auditory acuity. Neck:  Supple; no masses or thyroidomegaly Lungs: Respirations even and unlabored. Lungs clear to auscultation bilaterally.   No wheezes, crackles, or rhonchi.  Heart:  Regular rate and rhythm;  Without murmur, clicks, rubs or gallops Abdomen:  Soft, nondistended, nontender. Normal bowel sounds. No appreciable masses or hepatomegaly.  No rebound or guarding.  Rectal:  Not performed. Msk:  Symmetrical without gross deformities.  Strength normal Extremities:  Without edema, cyanosis  or clubbing. Neurologic:  Alert and oriented x3;  grossly normal neurologically. Skin:  Intact without significant lesions or rashes. Psych:  Alert and cooperative. Normal affect.  LAB RESULTS: CBC Latest Ref Rng & Units 10/06/2020 10/06/2020 10/06/2020  WBC 4.0 - 10.5 K/uL - 10.5 -  Hemoglobin 12.0 - 15.0 g/dL 11.7(L) 11.9(L) 11.9(L)  Hematocrit 36.0 - 46.0 % 35.4(L) 35.6(L) 35.0(L)  Platelets 150 - 400 K/uL - 332 -    BMET BMP Latest Ref Rng & Units 10/06/2020 10/05/2020 09/03/2020  Glucose 70 - 99 mg/dL 81 87 -  BUN 6 - 20 mg/dL 15 18 -  Creatinine 0.44 - 1.00 mg/dL 0.89 0.81 0.90  Sodium 135 - 145 mmol/L 137 139 -  Potassium 3.5 - 5.1 mmol/L 4.0 3.9 -  Chloride 98 - 111 mmol/L 107 109 -  CO2 22 - 32 mmol/L 22 22 -  Calcium 8.9 - 10.3 mg/dL 8.4(L) 8.8(L) -    LFT Hepatic Function Latest Ref Rng & Units 10/05/2020 10/31/2019 09/06/2019  Total Protein 6.5 - 8.1 g/dL 7.1 6.3(L) 7.4  Albumin 3.5 - 5.0 g/dL 3.7 3.3(L) 4.0  AST 15 - 41 U/L 16 17 14(L)  ALT 0 - 44 U/L '16 15 14  ' Alk Phosphatase 38 - 126 U/L 46 35(L) 46  Total Bilirubin 0.3 - 1.2 mg/dL 0.6 0.7 0.4     STUDIES: CT Angio Abd/Pel w/ and/or w/o  Result Date: 10/06/2020 CLINICAL DATA:  Gastrointestinal hemorrhage, rectal bleeding, left upper quadrant abdominal pain, fatigue EXAM: CTA ABDOMEN AND PELVIS WITHOUT AND WITH CONTRAST TECHNIQUE: Multidetector CT imaging of the abdomen and pelvis was performed using the standard protocol during bolus administration of intravenous contrast. Multiplanar reconstructed images and MIPs were obtained and reviewed to evaluate the vascular anatomy. CONTRAST:  138m OMNIPAQUE IOHEXOL 350 MG/ML SOLN COMPARISON:  10/31/2019 FINDINGS: VASCULAR Aorta: Unremarkable Celiac: Widely patent.  Classic anatomic configuration. SMA: Widely patent Renals: Single renal arteries are widely patent and demonstrate normal vascular morphology. No aneurysm. IMA: Widely patent Inflow: Widely patent. Internal iliac  arteries are patent bilaterally. Proximal Outflow: Minimal atherosclerotic calcification within the left common femoral artery. Widely patent bilaterally. Left inferior epigastric artery has been coil embolized in the interval. Veins: Infrarenal inferior vena cava filter is in place. The abdominal venous vasculature is otherwise unremarkable. Review of the MIP images confirms the above findings. NON-VASCULAR Lower chest: Visualized lungs are clear. Visualized heart and pericardium are unremarkable peer Hepatobiliary: No focal liver abnormality is seen. No gallstones, gallbladder wall thickening, or biliary dilatation. Pancreas: Unremarkable Spleen: Unremarkable Adrenals/Urinary Tract: Adrenal glands are unremarkable. Kidneys are normal, without renal calculi, focal lesion, or hydronephrosis. Bladder is unremarkable. Stomach/Bowel: Surgical changes of a gastric sleeve resection are identified. The stomach, small bowel, and large bowel are unremarkable save for mild descending and sigmoid colonic diverticulosis. Appendix normal. No free intraperitoneal gas or fluid. Previously noted intraperitoneal hematoma have resolved. Lymphatic: No pathologic adenopathy within the abdomen and pelvis. Reproductive: Uterus and bilateral adnexa are unremarkable. Other: Previously noted subcutaneous hematoma within the anterior abdominal wall have nearly completely resolved. Tiny umbilical fat containing hernia. Rectum unremarkable. Musculoskeletal: No acute bone abnormality. No lytic or blastic bone lesions. IMPRESSION: VASCULAR No active gastrointestinal hemorrhage identified. Interval left inferior epigastric arterial embolization NON-VASCULAR Interval resolution of previously noted intraperitoneal hematoma. Status post gastric sleeve resection. Mild distal colonic diverticulosis Electronically Signed   By: AFidela SalisburyMD   On: 10/06/2020 03:35      Impression / Plan:   TSharonica Kraszewskiis a 45y.o. female with history  of pseudotumor cerebri, IBS, recurrent PE on Coumadin, obesity, known history of bleeding from internal hemorrhoids is admitted with rectal bleeding, hemodynamically insignificant.  Her rectal bleeding has significantly resolved spontaneously within last 24 hours.  Patient had a mild drop in hemoglobin and has been stable.  Painless rectal bleeding Recurrent bleeding from internal hemorrhoids in setting of anticoagulation I do not recommend repeat colonoscopy at this time as her previous colonoscopy was in 05/2019, with adequate bowel prep Repeat hemoglobin is stable, therefore patient can be discharged home today and can follow-up with me as outpatient to discuss about outpatient hemorrhoid ligation.  Recommend Anusol cream or suppository per rectum twice daily for 7 to 10 days upon discharge   Thank you for involving me in the care of this patient.      LOS: 0 days   RSherri Sear MD  10/06/2020, 4:49 PM   Note: This dictation was prepared with Dragon dictation along with smaller phrase technology. Any transcriptional errors that result from this process are unintentional.

## 2020-10-07 ENCOUNTER — Telehealth: Payer: Self-pay | Admitting: Internal Medicine

## 2020-10-07 ENCOUNTER — Telehealth: Payer: Self-pay | Admitting: *Deleted

## 2020-10-07 DIAGNOSIS — Z86711 Personal history of pulmonary embolism: Secondary | ICD-10-CM

## 2020-10-07 DIAGNOSIS — Z7901 Long term (current) use of anticoagulants: Secondary | ICD-10-CM | POA: Diagnosis not present

## 2020-10-07 DIAGNOSIS — K922 Gastrointestinal hemorrhage, unspecified: Secondary | ICD-10-CM | POA: Diagnosis not present

## 2020-10-07 LAB — PROTIME-INR
INR: 2.4 — ABNORMAL HIGH (ref 0.8–1.2)
Prothrombin Time: 25.6 seconds — ABNORMAL HIGH (ref 11.4–15.2)

## 2020-10-07 LAB — CBC
HCT: 34.4 % — ABNORMAL LOW (ref 36.0–46.0)
Hemoglobin: 11.5 g/dL — ABNORMAL LOW (ref 12.0–15.0)
MCH: 29.7 pg (ref 26.0–34.0)
MCHC: 33.4 g/dL (ref 30.0–36.0)
MCV: 88.9 fL (ref 80.0–100.0)
Platelets: 296 10*3/uL (ref 150–400)
RBC: 3.87 MIL/uL (ref 3.87–5.11)
RDW: 14 % (ref 11.5–15.5)
WBC: 9 10*3/uL (ref 4.0–10.5)
nRBC: 0 % (ref 0.0–0.2)

## 2020-10-07 LAB — GLUCOSE, CAPILLARY: Glucose-Capillary: 70 mg/dL (ref 70–99)

## 2020-10-07 MED ORDER — FERROUS SULFATE 325 (65 FE) MG PO TBEC: 325.0000 mg | DELAYED_RELEASE_TABLET | Freq: Two times a day (BID) | ORAL | 3 refills | Status: AC

## 2020-10-07 MED ORDER — HYDROCORTISONE ACETATE 25 MG RE SUPP: 25.0000 mg | Freq: Two times a day (BID) | RECTAL | 0 refills | Status: AC

## 2020-10-07 MED ORDER — HYDROCORTISONE ACETATE 25 MG RE SUPP
25.0000 mg | Freq: Two times a day (BID) | RECTAL | Status: DC
  Administered 2020-10-07: 25 mg via RECTAL
  Filled 2020-10-07 (×2): qty 1

## 2020-10-07 NOTE — Telephone Encounter (Signed)
  Patient seen by GI.  Etiology of bleeding felt secondary to hemorrhoids.  Patient discharged today.  Can follow-up in clinic.  M

## 2020-10-07 NOTE — Plan of Care (Signed)

## 2020-10-07 NOTE — Care Management CC44 (Signed)
Condition Code 44 Documentation Completed  Patient Details  Name: Angela [name] MRN: [id number] Date of Birth: Oct 19, 1975   Condition Code 44 given:  Yes Patient signature on Condition Code 44 notice:  Yes Documentation of 2 MD's agreement:  Yes Code 44 added to claim:  Yes    Kerin Salen, RN 10/07/2020, 10:05 AM

## 2020-10-07 NOTE — TOC Transition Note (Signed)
Transition of Care [facility]) - CM/SW Discharge Note   Patient Details  Name: Angela [name] MRN: [id number] Date of Birth: [birth year]  [facility] CM/SW Contact:  Kerin Salen, RN Phone Number: 10/07/2020, 4:09 PM   Clinical Narrative:  Patient understands she will need to see PCP for further evaluation, her current condition does not qualify for inpatient status. Patient states she did not have money to pay for medications until March 3rd. Discussed barrier with my Manager, [facility] [place] was called, medication cost is $82. Patient was given $40 cash, cried and says thank you, I did advise patient to ask Pharmacist if she could get half meds filled, or if there was an OTC equivalent. Patient verbalizes understanding and appreciative. RN working with patient told patient was ready for discharge.    Final next level of care: Home/Self Care Barriers to Discharge: Barriers Resolved   Patient Goals and CMS Choice Patient states their goals for this hospitalization and ongoing recovery are:: To return home.   Choice offered to / list presented to : NA  Discharge Placement                Patient to be transferred to facility by: Son Name of family member notified: Patient states she called her Son. Patient and family notified of of transfer: 10/07/20  Discharge Plan and Services                DME Arranged: N/A DME Agency: NA       HH Arranged: NA HH Agency: NA        Social Determinants of Health (SDOH) Interventions     Readmission Risk Interventions No flowsheet data found.

## 2020-10-07 NOTE — Progress Notes (Signed)
Pt d/c'd today prior to exam by myself.  Per RN, patient changed her mind about d/c after discussing case with SW.  She has f/u appt with GI. If needed surgery will be available on outpt basis

## 2020-10-07 NOTE — Telephone Encounter (Signed)
Patient called reporting that she has been  the hospital since Saturday passing numerous clots from her rectum multiple times and it continues. She is upset because they want to discharge her home without finding the cause for them and told her it was hemorrhoids bleeding, she does believe this to be the case and she will have to go back on her blood thinner when discharged which will cause more bleeding. She is asking for Dr Humberto Seals to intervene and do testing to find the cause of her bleeding

## 2020-10-07 NOTE — Discharge Summary (Signed)
Physician Discharge Summary  Dekisha Mesmer FTD:[id number] DOB: 1975/12/20 DOA: 10/06/2020  PCP: Gladstone Lighter, MD  Admit date: 10/06/2020 Discharge date: 10/07/2020  Admitted From: Home Disposition: Home  Recommendations for Outpatient Follow-up:  1. Follow up with PCP in 1-2 weeks 2. Follow-up with gastroenterology within next week 3. Please obtain BMP/CBC in one week 4. Please follow up on the following pending results: None  Home Health: No Equipment/Devices: None Discharge Condition: Stable CODE STATUS: Full Diet recommendation: Heart Healthy   Brief/Interim Summary: Margaruite Top Reidis a 45 y.o.African-American femalewith medical history significant formultiple medical problems  including asthma, depression, sleeve surgery followed by GI bleed requiring embolization, fibromyalgia, GERD, hypertension, diverticulosis, chronic PE on Coumadin and hypothyroidism, who presented to the ER with a [place]is[place] of bright red bleeding per rectum that started around 7 PM with associated abdominal discomfort and flatulence. She had mild dysuria without oliguria or hematuria or flank pain. She admits to nausea without vomiting. She has been having chills and has not checked her temperature. She had COVID-19 on 08/26/2020 and recovered.  On presentation she was afebrile, hemoglobin at 11.9 with baseline around 13.  INR of 2.5.  Her home dose of Coumadin was held initially and GI was consulted.  CTA abdomen with no obvious source of bleeding and some diverticulosis.  Patient had recent colonoscopy which shows diverticulosis and internal hemorrhoids.  Bleeding was thought to be secondary to internal hemorrhoids.  GI is recommending Anusol suppositories and a close follow-up as an outpatient for banding of her internal hemorrhoids.  Hemoglobin was 11.5 on the day of discharge.  GI discussed her anticoagulation with hematology and they were recommending continuation of Coumadin.  Patient  will continue rest of her home medications and will follow up with her providers as an outpatient.  Discharge Diagnoses:  Active Problems:   Warfarin anticoagulation   GI bleed   Lower GI bleed   History of pulmonary embolism  Discharge Instructions  Discharge Instructions    Diet - low sodium heart healthy   Complete by: As directed    Discharge instructions   Complete by: As directed    It was pleasure taking care of you. It looks like your bleeding from your internal hemorrhoids, you are being given suppositories to be used twice daily by your gastroenterologist. Follow-up with gastroenterology as an outpatient for definitive treatment of your internal hemorrhoids. Continue using your Coumadin and follow-up with your primary care provider for the duration of your anticoagulation with Coumadin, whether you can stop it or you have to continue rest of your life.   Increase activity slowly   Complete by: As directed      Allergies as of 10/07/2020      Reactions   Bee Venom Other (See Comments), Nausea Only, Rash, Shortness Of Breath   Bupropion Other (See Comments)   Lisinopril Cough   Amoxicillin Hives   Did it involve swelling of the face/tongue/throat, SOB, or low BP? No Did it involve sudden or severe rash/hives, skin peeling, or any reaction on the inside of your mouth or nose? No Did you need to seek medical attention at a hospital or doctor's office? Unknown When did it last happen?5+ years If all above answers are "NO", may proceed with cephalosporin use.   [name]    Break out in mouth   Nortriptyline    Unknown reaction    Orange Fruit [citrus]    Break out in mouth   Other    Sulfa  eye drops- Scratched corneas   Penicillins Hives   Did it involve swelling of the face/tongue/throat, SOB, or low BP? No Did it involve sudden or severe rash/hives, skin peeling, or any reaction on the inside of your mouth or nose? No Did you need to seek medical attention at a  hospital or doctor's office? Unknown When did it last happen?5+ years If all above answers are "NO", may proceed with cephalosporin use.   Sulfa Antibiotics    Milnacipran Palpitations      Medication List    STOP taking these medications   amLODipine 10 MG tablet Commonly known as: NORVASC   Difluprednate 0.05 % Emul   enoxaparin 150 MG/ML injection Commonly known as: Lovenox   ergocalciferol 1.25 MG (50000 UT) capsule Commonly known as: VITAMIN D2   hydrocortisone 2.5 % cream   mupirocin ointment 2 % Commonly known as: BACTROBAN   nystatin powder Commonly known as: MYCOSTATIN/NYSTOP   topiramate 100 MG tablet Commonly known as: TOPAMAX   topiramate 50 MG tablet Commonly known as: TOPAMAX     TAKE these medications   acetaminophen 500 MG tablet Commonly known as: TYLENOL Take 1,000-1,500 mg by mouth every 6 (six) hours as needed for moderate pain or headache.   albuterol 108 (90 Base) MCG/ACT inhaler Commonly known as: VENTOLIN HFA Inhale 2 puffs into the lungs every 6 (six) hours as needed for wheezing.   budesonide 0.5 MG/2ML nebulizer solution Commonly known as: PULMICORT Take 2 mLs by nebulization 2 (two) times daily. What changed: Another medication with the same name was removed. Continue taking this medication, and follow the directions you see here.   Cholecalciferol 125 MCG (5000 UT) Tabs Take 1 tablet by mouth daily.   clindamycin 1 % lotion Commonly known as: CLEOCIN T Apply 1 application topically 2 (two) times daily as needed (acne).   cyclobenzaprine 10 MG tablet Commonly known as: FLEXERIL Take 1 tablet (10 mg total) by mouth as needed.   diphenhydrAMINE 25 mg capsule Commonly known as: BENADRYL Take 25 mg by mouth 2 (two) times daily as needed for allergies or sleep.   EPINEPHrine 0.3 mg/0.3 mL Soaj injection Commonly known as: EPI-PEN Inject 0.3 mg into the muscle as needed for anaphylaxis.   famotidine 40 MG  tablet Commonly known as: PEPCID Take 40 mg by mouth at bedtime.   ferrous sulfate 325 (65 FE) MG EC tablet Take 1 tablet (325 mg total) by mouth 2 (two) times daily.   fluticasone 50 MCG/ACT nasal spray Commonly known as: FLONASE Place 1 spray into both nostrils daily as needed for rhinitis.   gabapentin 300 MG capsule Commonly known as: NEURONTIN Take 300 mg by mouth 3 (three) times daily.   Galcanezumab-gnlm 120 MG/ML Soaj Inject 120 mg into the skin every 28 (twenty-eight) days.   hydrocortisone 25 MG suppository Commonly known as: ANUSOL-HC Place 1 suppository (25 mg total) rectally 2 (two) times daily.   levothyroxine 137 MCG tablet Commonly known as: SYNTHROID Take 137 mcg by mouth daily. What changed: Another medication with the same name was removed. Continue taking this medication, and follow the directions you see here.   linaclotide 72 MCG capsule Commonly known as: LINZESS Take 72 mcg by mouth daily as needed (constipation).   loratadine 10 MG tablet Commonly known as: CLARITIN Take 10 mg by mouth daily.   meclizine 25 MG tablet Commonly known as: ANTIVERT Take 25 mg by mouth as needed.   Melatonin 3 MG Caps Take 3  mg by mouth at bedtime as needed (sleep).   montelukast 10 MG tablet Commonly known as: SINGULAIR Take 10 mg by mouth at bedtime.   multivitamin with minerals Tabs tablet Take 1 tablet by mouth daily.   ondansetron 8 MG tablet Commonly known as: ZOFRAN Take 8 mg by mouth every 8 (eight) hours as needed for nausea or vomiting.   oxybutynin 5 MG 24 hr tablet Commonly known as: DITROPAN-XL Take 5 mg by mouth daily. What changed: Another medication with the same name was removed. Continue taking this medication, and follow the directions you see here.   pantoprazole 40 MG tablet Commonly known as: PROTONIX Take by mouth daily.   polyethylene glycol 17 g packet Commonly known as: MIRALAX / GLYCOLAX PLEASE SEE ATTACHED FOR DETAILED  DIRECTIONS   prednisoLONE acetate 1 % ophthalmic suspension Commonly known as: PRED FORTE Place 1 drop into both eyes every 4 (four) hours as needed (uveitis).   promethazine 25 MG tablet Commonly known as: PHENERGAN Take 25 mg by mouth every 6 (six) hours as needed for nausea or vomiting.   REFRESH LIQUIGEL OP Place 1 drop into both eyes daily as needed (dry eyes).   rosuvastatin 20 MG tablet Commonly known as: CRESTOR Take 20 mg by mouth daily.   Semaglutide(0.25 or 0.5MG /DOS) 2 MG/1.5ML Sopn Inject 0.5 mg into the skin once a week. (Wednesday)   sodium fluoride 1.1 % Gel dental gel Commonly known as: FLUORISHIELD Place 1 application onto teeth at bedtime.   tiZANidine 4 MG tablet Commonly known as: ZANAFLEX Take 4 mg by mouth every 12 (twelve) hours as needed for muscle spasms.   traMADol 50 MG tablet Commonly known as: ULTRAM Take 1 tablet (50 mg total) by mouth every 6 (six) hours as needed for moderate pain.   Ubrogepant 50 MG Tabs Take 1 tablet by mouth as needed. May repeat in 2 hours   warfarin 3 MG tablet Commonly known as: COUMADIN Take by mouth.       Follow-up Information    Gladstone Lighter, MD. Schedule an appointment as soon as possible for a visit on 10/11/2020.   Specialty: Internal Medicine Why: @ 3:45pm Contact information: [place] [address] [phone]        Lin Landsman, MD On 10/22/2020.   Specialty: Gastroenterology Why: @ 1:15pm Contact information: [place] [address] [phone]              Allergies  Allergen Reactions  . Bee Venom Other (See Comments), Nausea Only, Rash and Shortness Of Breath  . Bupropion Other (See Comments)  . Lisinopril Cough  . Amoxicillin Hives    Did it involve swelling of the face/tongue/throat, SOB, or low BP? No Did it involve sudden or severe rash/hives, skin peeling, or any reaction on the inside of your mouth or nose? No Did you  need to seek medical attention at a hospital or doctor's office? Unknown When did it last happen?5+ years If all above answers are "NO", may proceed with cephalosporin use.   Angela [name]     Break out in mouth  . Nortriptyline     Unknown reaction   . Orange Fruit [Citrus]     Break out in mouth  . Other     Sulfa eye drops- Scratched corneas  . Penicillins Hives    Did it involve swelling of the face/tongue/throat, SOB, or low BP? No Did it involve sudden or severe rash/hives, skin  peeling, or any reaction on the inside of your mouth or nose? No Did you need to seek medical attention at a hospital or doctor's office? Unknown When did it last happen?5+ years If all above answers are "NO", may proceed with cephalosporin use.   . Sulfa Antibiotics   . Milnacipran Palpitations    Consultations:  GI  Procedures/Studies: VAS [place] IVC/ILIAC (VENOUS ONLY)  Result Date: 09/30/2020 IVC/ILIAC STUDY Indications: IVC filter Limitations: Air/bowel gas and obesity.  Performing Technologist: Charlane Ferretti RT (R)(VS)  Examination Guidelines: A complete evaluation includes B-mode imaging, spectral Doppler, color Doppler, and power Doppler as needed of all accessible portions of each vessel. Bilateral testing is considered an integral part of a complete examination. Limited examinations for reoccurring indications may be performed as noted.  IVC/Iliac Findings: +----------+------+--------+--------+    IVC    PatentThrombusComments +----------+------+--------+--------+ IVC Prox  patent                 +----------+------+--------+--------+ IVC Mid   patent                 +----------+------+--------+--------+ IVC Distalpatent                 +----------+------+--------+--------+  +-------------------+---------+-----------+---------+-----------+--------+         CIV        RT-PatentRT-ThrombusLT-PatentLT-ThrombusComments  +-------------------+---------+-----------+---------+-----------+--------+ Common Iliac Prox   patent              patent                      +-------------------+---------+-----------+---------+-----------+--------+ Common Iliac Distal patent              patent                      +-------------------+---------+-----------+---------+-----------+--------+  +-----------------------+---------+-----------+---------+-----------+--------+           EIV          RT-PatentRT-ThrombusLT-PatentLT-ThrombusComments +-----------------------+---------+-----------+---------+-----------+--------+ External Iliac Vein Mid patent              patent                      +-----------------------+---------+-----------+---------+-----------+--------+  Summary: IVC/Iliac: No evidence of thrombus in IVC and Iliac veins.  *See table(s) above for measurements and observations.  Electronically signed by Hortencia Pilar MD on 09/30/2020 at 5:06:41 PM.   Final    CT Angio Abd/Pel w/ and/or w/o  Result Date: 10/06/2020 CLINICAL DATA:  Gastrointestinal hemorrhage, rectal bleeding, left upper quadrant abdominal pain, fatigue EXAM: CTA ABDOMEN AND PELVIS WITHOUT AND WITH CONTRAST TECHNIQUE: Multidetector CT imaging of the abdomen and pelvis was performed using the standard protocol during bolus administration of intravenous contrast. Multiplanar reconstructed images and MIPs were obtained and reviewed to evaluate the vascular anatomy. CONTRAST:  114mL OMNIPAQUE IOHEXOL 350 MG/ML SOLN COMPARISON:  10/31/2019 FINDINGS: VASCULAR Aorta: Unremarkable Celiac: Widely patent.  Classic anatomic configuration. SMA: Widely patent Renals: Single renal arteries are widely patent and demonstrate normal vascular morphology. No aneurysm. IMA: Widely patent Inflow: Widely patent. Internal iliac arteries are patent bilaterally. Proximal Outflow: Minimal atherosclerotic calcification within the left common femoral artery. Widely  patent bilaterally. Left inferior epigastric artery has been coil embolized in the interval. Veins: Infrarenal inferior vena cava filter is in place. The abdominal venous vasculature is otherwise unremarkable. Review of the MIP images confirms the above findings. NON-VASCULAR Lower chest: Visualized lungs are clear. Visualized heart and pericardium are unremarkable peer Hepatobiliary: No  focal liver abnormality is seen. No gallstones, gallbladder wall thickening, or biliary dilatation. Pancreas: Unremarkable Spleen: Unremarkable Adrenals/Urinary Tract: Adrenal glands are unremarkable. Kidneys are normal, without renal calculi, focal lesion, or hydronephrosis. Bladder is unremarkable. Stomach/Bowel: Surgical changes of a gastric sleeve resection are identified. The stomach, small bowel, and large bowel are unremarkable save for mild descending and sigmoid colonic diverticulosis. Appendix normal. No free intraperitoneal gas or fluid. Previously noted intraperitoneal hematoma have resolved. Lymphatic: No pathologic adenopathy within the abdomen and pelvis. Reproductive: Uterus and bilateral adnexa are unremarkable. Other: Previously noted subcutaneous hematoma within the anterior abdominal wall have nearly completely resolved. Tiny umbilical fat containing hernia. Rectum unremarkable. Musculoskeletal: No acute bone abnormality. No lytic or blastic bone lesions. IMPRESSION: VASCULAR No active gastrointestinal hemorrhage identified. Interval left inferior epigastric arterial embolization NON-VASCULAR Interval resolution of previously noted intraperitoneal hematoma. Status post gastric sleeve resection. Mild distal colonic diverticulosis Electronically Signed   By: Fidela Salisbury MD   On: 10/06/2020 03:35     Subjective: Patient was seen and examined today.  She was very concerned about the discharge, stating that she is still seeing some blood during wiping.  Tried explaining about internal hemorrhoids and she need  to follow-up with GI within next couple of days for definitive treatment and continue using Anusol till that time.  Discharge Exam: Vitals:   10/07/20 0930 10/07/20 1144  BP: (!) 154/92 (!) 145/83  Pulse: 99 65  Resp: 20 20  Temp: 98.2 F (36.8 C) 98.1 F (36.7 C)  SpO2: 100% 100%   Vitals:   10/06/20 2013 10/07/20 0346 10/07/20 0930 10/07/20 1144  BP: 128/72 130/76 (!) 154/92 (!) 145/83  Pulse: 64 60 99 65  Resp: 15 15 20 20   Temp: 97.8 F (36.6 C) 98.2 F (36.8 C) 98.2 F (36.8 C) 98.1 F (36.7 C)  TempSrc: Oral  Oral Oral  SpO2: 100% 100% 100% 100%  Weight:  115.4 kg    Height:        General: Pt is alert, awake, not in acute distress Cardiovascular: RRR, S1/S2 +, no rubs, no gallops Respiratory: CTA bilaterally, no wheezing, no rhonchi Abdominal: Soft, NT, ND, bowel sounds + Extremities: no edema, no cyanosis   The results of significant diagnostics from this hospitalization (including imaging, microbiology, ancillary and laboratory) are listed below for reference.    Microbiology: Recent Results (from the past 240 hour(s))  Resp Panel by RT-PCR (Flu A&B, Covid) Nasopharyngeal Swab     Status: None   Collection Time: 10/06/20  3:01 AM   Specimen: Nasopharyngeal Swab; Nasopharyngeal(NP) swabs in vial transport medium  Result Value Ref Range Status   SARS Coronavirus 2 by RT PCR NEGATIVE NEGATIVE Final    Comment: (NOTE) SARS-CoV-2 target nucleic acids are NOT DETECTED.  The SARS-CoV-2 RNA is generally detectable in upper respiratory specimens during the acute phase of infection. The lowest concentration of SARS-CoV-2 viral copies this assay can detect is 138 copies/mL. A negative result does not preclude SARS-Cov-2 infection and should not be used as the sole basis for treatment or other patient management decisions. A negative result may occur with  improper specimen collection/handling, submission of specimen other than nasopharyngeal swab, presence of  viral mutation(s) within the areas targeted by this assay, and inadequate number of viral copies(<138 copies/mL). A negative result must be combined with clinical observations, patient history, and epidemiological information. The expected result is Negative.  Fact Sheet for Patients:  EntrepreneurPulse.com.au  Fact Sheet for Healthcare Providers:  IncredibleEmployment.be  This test is no t yet approved or cleared by the [place] and  has been authorized for detection and/or diagnosis of SARS-CoV-2 by FDA under an Emergency Use Authorization (EUA). This EUA will remain  in effect (meaning this test can be used) for the duration of the COVID-19 declaration under Section 564(b)(1) of the Act, 21 U.S.C.section 360bbb-3(b)(1), unless the authorization is terminated  or revoked sooner.       Influenza A by PCR NEGATIVE NEGATIVE Final   Influenza B by PCR NEGATIVE NEGATIVE Final    Comment: (NOTE) The Xpert Xpress SARS-CoV-2/FLU/RSV plus assay is intended as an aid in the diagnosis of influenza from Nasopharyngeal swab specimens and should not be used as a sole basis for treatment. Nasal washings and aspirates are unacceptable for Xpert Xpress SARS-CoV-2/FLU/RSV testing.  Fact Sheet for Patients: EntrepreneurPulse.com.au  Fact Sheet for Healthcare Providers: IncredibleEmployment.be  This test is not yet approved or cleared by the [place] FDA and has been authorized for detection and/or diagnosis of SARS-CoV-2 by FDA under an Emergency Use Authorization (EUA). This EUA will remain in effect (meaning this test can be used) for the duration of the COVID-19 declaration under Section 564(b)(1) of the Act, 21 U.S.C. section 360bbb-3(b)(1), unless the authorization is terminated or revoked.  Performed at [facility]: [address] [place], [place] [address]      Labs: BNP (last 3  results) Recent Labs    10/31/19 0403  BNP 36.1   Basic Metabolic Panel: Recent Labs  Lab 10/05/20 2352 10/06/20 0434  NA 139 137  K 3.9 4.0  CL 109 107  CO2 22 22  GLUCOSE 87 81  BUN 18 15  CREATININE 0.81 0.89  CALCIUM 8.8* 8.4*   Liver Function Tests: Recent Labs  Lab 10/05/20 2352  AST 16  ALT 16  ALKPHOS 46  BILITOT 0.6  PROT 7.1  ALBUMIN 3.7   No results for input(s): LIPASE, AMYLASE in the last 168 hours. No results for input(s): AMMONIA in the last 168 hours. CBC: Recent Labs  Lab 10/05/20 2352 10/06/20 0434 10/06/20 0608 10/06/20 1358 10/06/20 2019 10/07/20 0202  WBC 11.6* 12.9* 10.5  --   --  9.0  HGB 13.2 12.1 11.9*  11.9* 11.7* 12.5 11.5*  HCT 37.7 36.3 35.6*  35.0* 35.4* 39.7 34.4*  MCV 86.5 88.5 89.4  --   --  88.9  PLT 341 326 332  --   --  296   Cardiac Enzymes: No results for input(s): CKTOTAL, CKMB, CKMBINDEX, TROPONINI in the last 168 hours. BNP: Invalid input(s): POCBNP CBG: Recent Labs  Lab 10/06/20 0803 10/06/20 1139 10/06/20 1635 10/06/20 2013 10/07/20 0832  GLUCAP 80 103* 88 101* 70   D-Dimer No results for input(s): DDIMER in the last 72 hours. Hgb A1c Recent Labs    10/06/20 0608  HGBA1C 5.4   Lipid Profile No results for input(s): CHOL, HDL, LDLCALC, TRIG, CHOLHDL, LDLDIRECT in the last 72 hours. Thyroid function studies Recent Labs    10/06/20 0608  TSH 6.300*   Anemia work up Recent Labs    10/06/20 0608  VITAMINB12 610  FOLATE 11.4  FERRITIN 111  TIBC 237*  IRON 50   Urinalysis    Component Value Date/Time   COLORURINE YELLOW 04/03/2019 1153   APPEARANCEUR HAZY (A) 04/03/2019 1153   APPEARANCEUR Hazy 03/18/2012 1732   LABSPEC 1.015 04/03/2019 1153   LABSPEC 1.016 03/18/2012 1732   PHURINE 5.5 04/03/2019 1153  GLUCOSEU NEGATIVE 04/03/2019 1153   GLUCOSEU Negative 03/18/2012 1732   HGBUR NEGATIVE 04/03/2019 1153   BILIRUBINUR NEGATIVE 04/03/2019 1153   BILIRUBINUR Negative 03/18/2012  1732   KETONESUR NEGATIVE 04/03/2019 1153   PROTEINUR NEGATIVE 04/03/2019 1153   UROBILINOGEN 0.2 11/04/2010 1809   NITRITE NEGATIVE 04/03/2019 1153   LEUKOCYTESUR NEGATIVE 04/03/2019 1153   LEUKOCYTESUR Negative 03/18/2012 1732   Sepsis Labs Invalid input(s): PROCALCITONIN,  WBC,  LACTICIDVEN Microbiology Recent Results (from the past 240 hour(s))  Resp Panel by RT-PCR (Flu A&B, Covid) Nasopharyngeal Swab     Status: None   Collection Time: 10/06/20  3:01 AM   Specimen: Nasopharyngeal Swab; Nasopharyngeal(NP) swabs in vial transport medium  Result Value Ref Range Status   SARS Coronavirus 2 by RT PCR NEGATIVE NEGATIVE Final    Comment: (NOTE) SARS-CoV-2 target nucleic acids are NOT DETECTED.  The SARS-CoV-2 RNA is generally detectable in upper respiratory specimens during the acute phase of infection. The lowest concentration of SARS-CoV-2 viral copies this assay can detect is 138 copies/mL. A negative result does not preclude SARS-Cov-2 infection and should not be used as the sole basis for treatment or other patient management decisions. A negative result may occur with  improper specimen collection/handling, submission of specimen other than nasopharyngeal swab, presence of viral mutation(s) within the areas targeted by this assay, and inadequate number of viral copies(<138 copies/mL). A negative result must be combined with clinical observations, patient history, and epidemiological information. The expected result is Negative.  Fact Sheet for Patients:  EntrepreneurPulse.com.au  Fact Sheet for Healthcare Providers:  IncredibleEmployment.be  This test is no t yet approved or cleared by the [place] FDA and  has been authorized for detection and/or diagnosis of SARS-CoV-2 by FDA under an Emergency Use Authorization (EUA). This EUA will remain  in effect (meaning this test can be used) for the duration of the COVID-19 declaration  under Section 564(b)(1) of the Act, 21 U.S.C.section 360bbb-3(b)(1), unless the authorization is terminated  or revoked sooner.       Influenza A by PCR NEGATIVE NEGATIVE Final   Influenza B by PCR NEGATIVE NEGATIVE Final    Comment: (NOTE) The Xpert Xpress SARS-CoV-2/FLU/RSV plus assay is intended as an aid in the diagnosis of influenza from Nasopharyngeal swab specimens and should not be used as a sole basis for treatment. Nasal washings and aspirates are unacceptable for Xpert Xpress SARS-CoV-2/FLU/RSV testing.  Fact Sheet for Patients: EntrepreneurPulse.com.au  Fact Sheet for Healthcare Providers: IncredibleEmployment.be  This test is not yet approved or cleared by the [place] FDA and has been authorized for detection and/or diagnosis of SARS-CoV-2 by FDA under an Emergency Use Authorization (EUA). This EUA will remain in effect (meaning this test can be used) for the duration of the COVID-19 declaration under Section 564(b)(1) of the Act, 21 U.S.C. section 360bbb-3(b)(1), unless the authorization is terminated or revoked.  Performed at [facility], [address] [place], [place] [address]     Time coordinating discharge: Over 30 minutes  SIGNED:  Lorella Nimrod, MD  [facility] 10/07/2020, 3:34 PM  If 7PM-7AM, please contact night-coverage [email or web address]  This record has been created using Systems analyst. Errors have been sought and corrected,but may not always be located. Such creation errors do not reflect on the standard of care.

## 2020-10-07 NOTE — Progress Notes (Signed)
Patient discharge instructions and medications reviewed with patient. Patient telemetry d/c, cardiac monitoring notified. IV removed Catheter in tact. Patient escorted via volunteer services.

## 2020-10-07 NOTE — Telephone Encounter (Signed)
On 2/26-received a call for the patient regarding blood in stools.  Reviewed the chart previous history of diverticulosis.  Recommend stopping Coumadin; INR at home 3.    Recommend going to the emergency room-if bleeding continues.  FYI-Dr.Corcoran- pt in hospital.please follow up.

## 2020-10-07 NOTE — Care Management Obs Status (Signed)
[place] NOTIFICATION   Patient Details  Name: Leathie Weich MRN: [id number] Date of Birth: 09-17-1975   Medicare Observation Status Notification Given:  Yes    Kerin Salen, RN 10/07/2020, 10:04 AM

## 2020-10-08 NOTE — Telephone Encounter (Signed)
10/08/2020 Spoke w/ pt and informed her that appt for hospital f/u was made for 3/9 @ 3:45 per Dr. Loletha Grayer. Pt confirmed appt [facility]

## 2020-10-14 ENCOUNTER — Encounter: Payer: Self-pay | Admitting: Hematology and Oncology

## 2020-10-15 NOTE — Progress Notes (Signed)
[facility]  [address], [address] [place], [place] [address] Phone: [phone]  Fax: [phone]   Telemedicine Office Visit:  10/16/2020  Referring physician: Gladstone Lighter, MD  I connected with Lajuana Carry on 06/24/2020 at 4:25 PM by videoconferencing and verified that I was speaking with the correct person using 2 identifiers.  The patient was at home.  I discussed the limitations, risk, security and privacy concerns of performing an evaluation and management service by videoconferencing and the availability of in person appointments.  I also discussed with the patient that there may be a patient responsible charge related to this service.  The patient expressed understanding and agreed to proceed.   Chief Complaint: Angela [name] is a 45 y.o. female s/p gastric bypass surgery and a history of recurrent pulmonary embolism who is seen assessment after hospitalization.  HPI: The patient was last seen in the hematology clinic on 06/24/2020 via telemedicine. At that time, she had been "ok". She had been fatigued x 2 weeks. She reported leg pain and "knots." Her feet swelled on and off. INR testing at home fluctuated. She alternated Coumadin 10 mg and 7.5 mg. She did not have an appetite; the sizes of her clothes had decreased but her weight had plateaued.  The patient tested positive for COVID-19 on 08/23/2020. She received the Sotrovimab monoclonal antibody infusion on 08/29/2020.  The patient was admitted to [facility] from 10/06/2020 - 10/07/2020 for blood in her stool, chills, and nausea. Coumadin was held. Abdominal CT angiogram showed some diverticulosis but no obvious source of bleeding. The bleeding was thought to be secondary to internal hemorrhoids. GI recommended Anusol suppositories and close follow-up as outpatient for banding of internal hemorrhoids. She was to restart Coumadin. Hemoglobin was 11.5 on discharge.  During the interim, she has  been "ok". She was in the hospital for blood in the stool. She states that it was a lot of blood with clots. She felt like she had to have a bowel movement but only blood was coming out. It took her a few days to stop bleeding after she left the hospital. Her INR was 1.0 when she checked it at home today.  She is losing weight very slowly because of a lot of stress in her life. She has been nauseous and has had no appetite. She takes oral iron. She takes a multivitamin that has vitamin B12 in it. She talks to a dietician at [facility] regularly.  She has rare sweats. Her palpitations are stable; she was put on propanolol. Her back pain is "terrible." Her dizziness is still bad and she is now taking meclizine. Her leg swelling has resolved.   Past Medical History:  Diagnosis Date  . Anemia   . Anxiety   . Asthma   . COVID-19 08/26/2020   Tested positive on 08/26/2020  . Depression   . Fibromyalgia   . GERD (gastroesophageal reflux disease)   . History of GI diverticular bleed   . Hypertension   . Irritable bowel syndrome (IBS)   . Pseudotumor cerebri   . Pulmonary embolism ([place])   . Raynaud's disease   . Thyroid disease    hypothyroid  . Vision disturbance     Past Surgical History:  Procedure Laterality Date  . ABDOMINAL HYSTERECTOMY  02/06/13  . BREAST BIOPSY Right October, 2014   right breast core biopsy, fibroadenomatous changes  . BREAST BIOPSY Right December 2014   FNA retroareolar nodule consistent with fibroadenoma.  Marland Kitchen  BREAST BIOPSY Right 12/16/2016   Fibroadenoma in the retroareolar area at 6:00.  Marland Kitchen [place]  . COLONOSCOPY  2015   Dr Allen Norris  . COLONOSCOPY WITH PROPOFOL N/A 05/27/2019   Procedure: COLONOSCOPY WITH PROPOFOL;  Surgeon: Virgel Manifold, MD;  Location: [facility] ENDOSCOPY;  Service: Endoscopy;  Laterality: N/A;  . EMBOLIZATION N/A 10/31/2019   Procedure: EMBOLIZATION;  Surgeon: Algernon Huxley, MD;  Location: [place] [facility];  Service:  Cardiovascular;  Laterality: N/A;  . ESOPHAGOGASTRODUODENOSCOPY (EGD) WITH PROPOFOL N/A 05/27/2019   Procedure: ESOPHAGOGASTRODUODENOSCOPY (EGD) WITH PROPOFOL;  Surgeon: Virgel Manifold, MD;  Location: [facility] ENDOSCOPY;  Service: Endoscopy;  Laterality: N/A;  . IVC FILTER INSERTION N/A 05/29/2019   Procedure: IVC FILTER INSERTION;  Surgeon: Algernon Huxley, MD;  Location: [place] [facility];  Service: Cardiovascular;  Laterality: N/A;  . RHINOPLASTY    . TONSILECTOMY, ADENOIDECTOMY, BILATERAL MYRINGOTOMY AND TUBES  2004  . UPPER GASTROINTESTINAL ENDOSCOPY  2015   Dr Allen Norris    Family History  Problem Relation Age of Onset  . Depression Mother   . Migraines Mother   . Diverticulitis Mother   . Hypertension Mother   . Heart disease Father   . Diabetes Father   . Hypertension Father   . Stroke Sister   . Cancer Maternal Aunt   . Breast cancer Maternal Aunt   . Cancer Maternal Grandmother     Social History:  reports that she has never smoked. She has never used smokeless tobacco. She reports current alcohol use. She reports that she does not use drugs. She drinks alcohol occasionally, in social settings. She has 3 children and 3 grandchildren.She lives in [place]. The patient is alone today.  Participants in the patient's visit and their role in the encounter included the patient and Baxter Flattery Day CMA, today.  The intake visit was provided by Baxter Flattery Day, CMA.  Allergies:  Allergies  Allergen Reactions  . Bee Venom Other (See Comments), Nausea Only, Rash and Shortness Of Breath  . Bupropion Other (See Comments)  . Lisinopril Cough  . Amoxicillin Hives    Did it involve swelling of the face/tongue/throat, SOB, or low BP? No Did it involve sudden or severe rash/hives, skin peeling, or any reaction on the inside of your mouth or nose? No Did you need to seek medical attention at a hospital or doctor's office? Unknown When did it last happen?5+ years If all above answers are "NO",  may proceed with cephalosporin use.   Marcelline Mates     Break out in mouth  . Nortriptyline     Unknown reaction   . Orange Fruit [Citrus]     Break out in mouth  . Other     Sulfa eye drops- Scratched corneas  . Penicillins Hives    Did it involve swelling of the face/tongue/throat, SOB, or low BP? No Did it involve sudden or severe rash/hives, skin peeling, or any reaction on the inside of your mouth or nose? No Did you need to seek medical attention at a hospital or doctor's office? Unknown When did it last happen?5+ years If all above answers are "NO", may proceed with cephalosporin use.   . Sulfa Antibiotics   . Milnacipran Palpitations    Current Medications: Current Outpatient Medications  Medication Sig Dispense Refill  . acetaminophen (TYLENOL) 500 MG tablet Take 1,000-1,500 mg by mouth every 6 (six) hours as needed for moderate pain or headache.    Marland Kitchen  albuterol (PROVENTIL HFA;VENTOLIN HFA) 108 (90 BASE) MCG/ACT inhaler Inhale 2 puffs into the lungs every 6 (six) hours as needed for wheezing.    . budesonide (PULMICORT) 0.5 MG/2ML nebulizer solution Take 2 mLs by nebulization 2 (two) times daily.    . Carboxymethylcellulose Sodium (REFRESH LIQUIGEL OP) Place 1 drop into both eyes daily as needed (dry eyes).    . Cholecalciferol 125 MCG (5000 UT) TABS Take 1 tablet by mouth daily.    . clindamycin (CLEOCIN T) 1 % lotion Apply 1 application topically 2 (two) times daily as needed (acne).    . cyclobenzaprine (FLEXERIL) 10 MG tablet Take 1 tablet (10 mg total) by mouth as needed. 30 tablet 3  . diphenhydrAMINE (BENADRYL) 25 mg capsule Take 25 mg by mouth 2 (two) times daily as needed for allergies or sleep.     . famotidine (PEPCID) 40 MG tablet Take 40 mg by mouth at bedtime.    . ferrous sulfate 325 (65 FE) MG EC tablet Take 1 tablet (325 mg total) by mouth 2 (two) times daily. 60 tablet 3  . gabapentin (NEURONTIN) 300 MG capsule Take 300 mg by mouth 3 (three) times daily.     . Galcanezumab-gnlm 120 MG/ML SOAJ Inject 120 mg into the skin every 28 (twenty-eight) days.     Marland Kitchen levothyroxine (SYNTHROID) 137 MCG tablet Take 137 mcg by mouth daily.    Marland Kitchen linaclotide (LINZESS) 72 MCG capsule Take 72 mcg by mouth daily as needed (constipation).    . meclizine (ANTIVERT) 25 MG tablet Take 25 mg by mouth as needed.    . Melatonin 3 MG CAPS Take 3 mg by mouth at bedtime as needed (sleep).    . Multiple Vitamin (MULTIVITAMIN WITH MINERALS) TABS tablet Take 1 tablet by mouth daily.    . ondansetron (ZOFRAN) 8 MG tablet Take 8 mg by mouth every 8 (eight) hours as needed for nausea or vomiting.    Marland Kitchen oxybutynin (DITROPAN-XL) 5 MG 24 hr tablet Take 5 mg by mouth daily.    . pantoprazole (PROTONIX) 40 MG tablet Take by mouth daily.    . promethazine (PHENERGAN) 25 MG tablet Take 25 mg by mouth every 6 (six) hours as needed for nausea or vomiting.    . rosuvastatin (CRESTOR) 20 MG tablet Take 20 mg by mouth daily.    Marland Kitchen tiZANidine (ZANAFLEX) 4 MG tablet Take 4 mg by mouth every 12 (twelve) hours as needed for muscle spasms.    . traMADol (ULTRAM) 50 MG tablet Take 1 tablet (50 mg total) by mouth every 6 (six) hours as needed for moderate pain. 12 tablet 0  . Ubrogepant 50 MG TABS Take 1 tablet by mouth as needed. May repeat in 2 hours    . warfarin (COUMADIN) 3 MG tablet Take by mouth.    . EPINEPHrine 0.3 mg/0.3 mL IJ SOAJ injection Inject 0.3 mg into the muscle as needed for anaphylaxis.  (Patient not taking: Reported on 10/16/2020)    . fluticasone (FLONASE) 50 MCG/ACT nasal spray Place 1 spray into both nostrils daily as needed for rhinitis. (Patient not taking: No sig reported)    . hydrocortisone (ANUSOL-HC) 25 MG suppository Place 1 suppository (25 mg total) rectally 2 (two) times daily. (Patient not taking: Reported on 10/16/2020) 12 suppository 0  . loratadine (CLARITIN) 10 MG tablet Take 10 mg by mouth daily. (Patient not taking: Reported on 10/16/2020)    . montelukast (SINGULAIR) 10  MG tablet Take 10 mg by mouth  at bedtime. (Patient not taking: Reported on 10/16/2020)    . polyethylene glycol (MIRALAX / GLYCOLAX) 17 g packet PLEASE SEE ATTACHED FOR DETAILED DIRECTIONS (Patient not taking: No sig reported)    . prednisoLONE acetate (PRED FORTE) 1 % ophthalmic suspension Place 1 drop into both eyes every 4 (four) hours as needed (uveitis).  (Patient not taking: Reported on 10/16/2020)    . sodium fluoride (FLUORISHIELD) 1.1 % GEL dental gel Place 1 application onto teeth at bedtime.  (Patient not taking: Reported on 10/16/2020)     No current facility-administered medications for this visit.    Review of Systems  Constitutional: Positive for diaphoresis (rare) and weight loss (152 lbs per pt). Negative for chills, fever and malaise/fatigue.  HENT: Negative for congestion, ear discharge, ear pain, hearing loss, nosebleeds, sinus pain, sore throat and tinnitus.   Eyes: Negative for blurred vision.  Respiratory: Positive for shortness of breath (chronic). Negative for cough, hemoptysis and sputum production.   Cardiovascular: Positive for palpitations (on propanolol). Negative for chest pain and leg swelling.  Gastrointestinal: Positive for nausea. Negative for abdominal pain, blood in stool (resolved), constipation, diarrhea, heartburn, melena and vomiting.       S/p gastric bypass surgery.  Irritable bowel syndrome (IBS). Poor appetite.  Genitourinary: Negative for dysuria, frequency, hematuria and urgency.  Musculoskeletal: Positive for back pain ("terrible"). Negative for joint pain, myalgias and neck pain.       Fibromyalgia.  Skin: Negative for itching and rash.  Neurological: Positive for dizziness. Negative for tingling, sensory change, weakness and headaches.  Endo/Heme/Allergies: Bruises/bleeds easily (bruising on legs).  Psychiatric/Behavioral: Negative for depression and memory loss. The patient is not nervous/anxious and does not have insomnia.        Stress.  All  other systems reviewed and are negative.   Performance status (ECOG):  1  Vitals There were no vitals taken for this visit.   Physical Exam Nursing note reviewed.  Constitutional:      Appearance: She is well-developed.  HENT:     Head:     Comments: Long curly black hair. Eyes:     Comments: Glasses.  Neurological:     Mental Status: She is alert and oriented to person, place, and time.  Psychiatric:        Behavior: Behavior normal.        Thought Content: Thought content normal.        Judgment: Judgment normal.    No visits with results within 3 Day(s) from this visit.  Latest known visit with results is:  Admission on 10/06/2020, Discharged on 10/07/2020  Component Date Value Ref Range Status  . Sodium 10/05/2020 139  135 - 145 mmol/L Final  . Potassium 10/05/2020 3.9  3.5 - 5.1 mmol/L Final  . Chloride 10/05/2020 109  98 - 111 mmol/L Final  . CO2 10/05/2020 22  22 - 32 mmol/L Final  . Glucose, Bld 10/05/2020 87  70 - 99 mg/dL Final   Glucose reference range applies only to samples taken after fasting for at least 8 hours.  . BUN 10/05/2020 18  6 - 20 mg/dL Final  . Creatinine, Ser 10/05/2020 0.81  0.44 - 1.00 mg/dL Final  . Calcium 10/05/2020 8.8* 8.9 - 10.3 mg/dL Final  . Total Protein 10/05/2020 7.1  6.5 - 8.1 g/dL Final  . Albumin 10/05/2020 3.7  3.5 - 5.0 g/dL Final  . AST 10/05/2020 16  15 - 41 U/L Final  . ALT 10/05/2020 16  0 - 44 U/L Final  . Alkaline Phosphatase 10/05/2020 46  38 - 126 U/L Final  . Total Bilirubin 10/05/2020 0.6  0.3 - 1.2 mg/dL Final  . GFR, Estimated 10/05/2020 >60  >60 mL/min Final   Comment: (NOTE) Calculated using the CKD-EPI Creatinine Equation (2021)   . Anion gap 10/05/2020 8  5 - 15 Final   Performed at [facility], [address] [place], [place] [address]  . WBC 10/05/2020 11.6* 4.0 - 10.5 K/uL Final  . RBC 10/05/2020 4.36  3.87 - 5.11 MIL/uL Final  . Hemoglobin 10/05/2020 13.2  12.0 - 15.0 g/dL Final  . HCT  10/05/2020 37.7  36.0 - 46.0 % Final  . MCV 10/05/2020 86.5  80.0 - 100.0 fL Final  . MCH 10/05/2020 30.3  26.0 - 34.0 pg Final  . MCHC 10/05/2020 35.0  30.0 - 36.0 g/dL Final  . RDW 10/05/2020 13.8  11.5 - 15.5 % Final  . Platelets 10/05/2020 341  150 - 400 K/uL Final  . nRBC 10/05/2020 0.0  0.0 - 0.2 % Final   Performed at [facility], [address] [place], [place] [address]  . ABO/RH(D) 10/05/2020 O POS   Final  . Antibody Screen 10/05/2020 NEG   Final  . Sample Expiration 10/05/2020    Final                   Value:10/08/2020,2359 Performed at [facility], [address] [place], [place] [address]   . Prothrombin Time 10/05/2020 26.7* 11.4 - 15.2 seconds Final  . INR 10/05/2020 2.6* 0.8 - 1.2 Final   Comment: (NOTE) INR goal varies based on device and disease states. Performed at [facility], [address], [place], [place] [address]   . SARS Coronavirus 2 by RT PCR 10/06/2020 NEGATIVE  NEGATIVE Final   Comment: (NOTE) SARS-CoV-2 target nucleic acids are NOT DETECTED.  The SARS-CoV-2 RNA is generally detectable in upper respiratory specimens during the acute phase of infection. The lowest concentration of SARS-CoV-2 viral copies this assay can detect is 138 copies/mL. A negative result does not preclude SARS-Cov-2 infection and should not be used as the sole basis for treatment or other patient management decisions. A negative result may occur with  improper specimen collection/handling, submission of specimen other than nasopharyngeal swab, presence of viral mutation(s) within the areas targeted by this assay, and inadequate number of viral copies(<138 copies/mL). A negative result must be combined with clinical observations, patient history, and epidemiological information. The expected result is Negative.  Fact Sheet for Patients:  EntrepreneurPulse.com.au  Fact Sheet for Healthcare Providers:   IncredibleEmployment.be  This test is no                          t yet approved or cleared by the [place] FDA and  has been authorized for detection and/or diagnosis of SARS-CoV-2 by FDA under an Emergency Use Authorization (EUA). This EUA will remain  in effect (meaning this test can be used) for the duration of the COVID-19 declaration under Section 564(b)(1) of the Act, 21 U.S.C.section 360bbb-3(b)(1), unless the authorization is terminated  or revoked sooner.      . Influenza A by PCR 10/06/2020 NEGATIVE  NEGATIVE Final  . Influenza B by PCR 10/06/2020 NEGATIVE  NEGATIVE Final   Comment: (NOTE) The Xpert Xpress SARS-CoV-2/FLU/RSV plus assay is intended as an aid in the diagnosis of influenza from Nasopharyngeal swab  specimens and should not be used as a sole basis for treatment. Nasal washings and aspirates are unacceptable for Xpert Xpress SARS-CoV-2/FLU/RSV testing.  Fact Sheet for Patients: EntrepreneurPulse.com.au  Fact Sheet for Healthcare Providers: IncredibleEmployment.be  This test is not yet approved or cleared by the [place] FDA and has been authorized for detection and/or diagnosis of SARS-CoV-2 by FDA under an Emergency Use Authorization (EUA). This EUA will remain in effect (meaning this test can be used) for the duration of the COVID-19 declaration under Section 564(b)(1) of the Act, 21 U.S.C. section 360bbb-3(b)(1), unless the authorization is terminated or revoked.  Performed at [facility], [address], [place], [place] [address]   . HIV Screen 4th Generation wRfx 10/06/2020 Non Reactive  Non Reactive Final   Performed at [facility], [place] [address] [place], [place] [address]  . Sodium 10/06/2020 137  135 - 145 mmol/L Final  . Potassium 10/06/2020 4.0  3.5 - 5.1 mmol/L Final  . Chloride 10/06/2020 107  98 - 111 mmol/L Final  . CO2 10/06/2020 22  22 - 32 mmol/L  Final  . Glucose, Bld 10/06/2020 81  70 - 99 mg/dL Final   Glucose reference range applies only to samples taken after fasting for at least 8 hours.  . BUN 10/06/2020 15  6 - 20 mg/dL Final  . Creatinine, Ser 10/06/2020 0.89  0.44 - 1.00 mg/dL Final  . Calcium 10/06/2020 8.4* 8.9 - 10.3 mg/dL Final  . GFR, Estimated 10/06/2020 >60  >60 mL/min Final   Comment: (NOTE) Calculated using the CKD-EPI Creatinine Equation (2021)   . Anion gap 10/06/2020 8  5 - 15 Final   Performed at [facility], [address] [place], [place] [address]  . WBC 10/06/2020 12.9* 4.0 - 10.5 K/uL Final  . RBC 10/06/2020 4.10  3.87 - 5.11 MIL/uL Final  . Hemoglobin 10/06/2020 12.1  12.0 - 15.0 g/dL Final  . HCT 10/06/2020 36.3  36.0 - 46.0 % Final  . MCV 10/06/2020 88.5  80.0 - 100.0 fL Final  . MCH 10/06/2020 29.5  26.0 - 34.0 pg Final  . MCHC 10/06/2020 33.3  30.0 - 36.0 g/dL Final  . RDW 10/06/2020 13.8  11.5 - 15.5 % Final  . Platelets 10/06/2020 326  150 - 400 K/uL Final  . nRBC 10/06/2020 0.0  0.0 - 0.2 % Final   Performed at [facility], [address], [place], [place] [address]  . Prothrombin Time 10/06/2020 26.3* 11.4 - 15.2 seconds Final  . INR 10/06/2020 2.5* 0.8 - 1.2 Final   Comment: (NOTE) INR goal varies based on device and disease states. Performed at [facility], [address] [place], [place] [address]   . aPTT 10/06/2020 99* 24 - 36 seconds Final   Comment:        IF BASELINE aPTT IS ELEVATED, SUGGEST PATIENT RISK ASSESSMENT BE USED TO DETERMINE APPROPRIATE ANTICOAGULANT THERAPY. Performed at [facility], [address], [place], [place] [address]   . Hemoglobin 10/06/2020 11.9* 12.0 - 15.0 g/dL Final  . HCT 10/06/2020 35.0* 36.0 - 46.0 % Final   Performed at [facility], [address] [place], [place] [address]  . Hemoglobin 10/06/2020 12.5  12.0 - 15.0 g/dL Final  . HCT 10/06/2020 39.7  36.0 - 46.0 % Final   Performed at  [facility], [address] [place], [place] [address]  . TSH 10/06/2020 6.300* 0.350 - 4.500 uIU/mL Final  Comment: Performed by a 3rd Generation assay with a functional sensitivity of <=0.01 uIU/mL. Performed at [facility], [address] [place], [place] [address]   . Hgb A1c MFr Bld 10/06/2020 5.4  4.8 - 5.6 % Final   Comment: (NOTE) Pre diabetes:          5.7%-6.4%  Diabetes:              >6.4%  Glycemic control for   <7.0% adults with diabetes   . Mean Plasma Glucose 10/06/2020 108.28  mg/dL Final   Performed at [place] [address], [place], [place] [address]  . Glucose-Capillary 10/06/2020 80  70 - 99 mg/dL Final   Glucose reference range applies only to samples taken after fasting for at least 8 hours.  . WBC 10/06/2020 10.5  4.0 - 10.5 K/uL Final  . RBC 10/06/2020 3.98  3.87 - 5.11 MIL/uL Final  . Hemoglobin 10/06/2020 11.9* 12.0 - 15.0 g/dL Final  . HCT 10/06/2020 35.6* 36.0 - 46.0 % Final  . MCV 10/06/2020 89.4  80.0 - 100.0 fL Final  . MCH 10/06/2020 29.9  26.0 - 34.0 pg Final  . MCHC 10/06/2020 33.4  30.0 - 36.0 g/dL Final  . RDW 10/06/2020 14.0  11.5 - 15.5 % Final  . Platelets 10/06/2020 332  150 - 400 K/uL Final  . nRBC 10/06/2020 0.0  0.0 - 0.2 % Final   Performed at [facility], [address], [place], [place] [address]  . Glucose-Capillary 10/06/2020 103* 70 - 99 mg/dL Final   Glucose reference range applies only to samples taken after fasting for at least 8 hours.  . Ferritin 10/06/2020 111  11 - 307 ng/mL Final   Performed at [facility], [address] [place], [place] [address]  . Iron 10/06/2020 50  28 - 170 ug/dL Final  . TIBC 10/06/2020 237* 250 - 450 ug/dL Final  . Saturation Ratios 10/06/2020 21  10.4 - 31.8 % Final  . UIBC 10/06/2020 187  ug/dL Final   Performed at [facility], [address], [place], [place] [address]  . Vitamin B-12 10/06/2020 610  180 - 914 pg/mL Final    Comment: (NOTE) This assay is not validated for testing neonatal or myeloproliferative syndrome specimens for Vitamin B12 levels. Performed at [facility], [place] [address] [place], [place] [address]   . Folate 10/06/2020 11.4  >5.9 ng/mL Final   Performed at [facility], [address] [place], [place] [address]  . Hemoglobin 10/06/2020 11.7* 12.0 - 15.0 g/dL Final  . HCT 10/06/2020 35.4* 36.0 - 46.0 % Final   Performed at [facility], [address] [place], [place] [address]  . Glucose-Capillary 10/06/2020 88  70 - 99 mg/dL Final   Glucose reference range applies only to samples taken after fasting for at least 8 hours.  . Prothrombin Time 10/07/2020 25.6* 11.4 - 15.2 seconds Final  . INR 10/07/2020 2.4* 0.8 - 1.2 Final   Comment: (NOTE) INR goal varies based on device and disease states. Performed at [facility], [address] [place], [place] [address]   . WBC 10/07/2020 9.0  4.0 - 10.5 K/uL Final  . RBC 10/07/2020 3.87  3.87 - 5.11 MIL/uL Final  . Hemoglobin 10/07/2020 11.5* 12.0 - 15.0 g/dL Final  . HCT 10/07/2020 34.4* 36.0 - 46.0 % Final  . MCV 10/07/2020 88.9  80.0 - 100.0 fL Final  . MCH  10/07/2020 29.7  26.0 - 34.0 pg Final  . MCHC 10/07/2020 33.4  30.0 - 36.0 g/dL Final  . RDW 10/07/2020 14.0  11.5 - 15.5 % Final  . Platelets 10/07/2020 296  150 - 400 K/uL Final  . nRBC 10/07/2020 0.0  0.0 - 0.2 % Final   Performed at [facility], [address] [place], [place] [address]  . Glucose-Capillary 10/06/2020 101* 70 - 99 mg/dL Final   Glucose reference range applies only to samples taken after fasting for at least 8 hours.  . Glucose-Capillary 10/07/2020 70  70 - 99 mg/dL Final   Glucose reference range applies only to samples taken after fasting for at least 8 hours.     Assessment:  Wandy Bossler is a 45 y.o. 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 She is on Coumadin.  Work-up on  12/22/2018 included the following normal/negative labs: CBC with diff, lupus anticoagulant, protein C activity, and protein S activity/antigen.Normal labson 01/06/2019 included: Factor V Leiden, prothrombin gene mutation, anticardiolipin antibodies, and beta2-glycoprotein antibodies.  Chest CT angiogramon 12/20/2018 revealed a wedge-shaped opacity in the right lower lobelateral basal segment consistent with a pulmonary infarct. There was relative hypoenhancement within the associated segmental artery probablysecondary toa small pulmonary embolus, though it wasdifficult to assessgiven limitationsof beam attenuation caused by the patient's body habitus. There was a small right pleural effusion with associated atelectasis.  Bilateral lower extremity duplexon 12/21/2018 revealed no femoropopliteal and no calf DVT in the visualized calf veins.  AbdomenandpelvisCTon 01/16/2019 revealed no acute findings. There was a new 2 cm pleural-based nodule in posterior right lower lobe.  PET scanon 01/26/2019 revealed no hypermetabolic findings highly suspicious for a malignant process. 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  [facility] for diverticulosis and a lower GI bleed. CTA abdomen and pelvison 05/26/2019 showed scattered diverticulosis, large stool burden. Tagged RBCscanon 05/26/2019 showed no evidence of GI bleed. IVC filterwas placedon 05/29/2019. EGDand colonoscopyon 05/27/2019 showed no evidence of active or recent bleeding. The source of patient's bleeding was likely internal hemorrhoids.  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  She  underwent embolization of the left inferior epigastric artery.   She was admitted to [facility] from 10/06/2020 - 10/07/2020 for blood in her stool, chills, and nausea. Abdominal CT angiogram showed some diverticulosis but no obvious source of bleeding. The bleeding was thought to be secondary to internal hemorrhoids. GI recommended Anusol suppositories and close follow-up as outpatient for banding of internal hemorrhoids.   Bilateral mammogramandleft breast ultrasoundon 06/26/2019 revealed a probably benign,12 mm fibrocystic lesionat the 9:30 o'clock position, 7 cm the nipple. Ultrasound revealeda cystic and solid lesion with echogenic foci consistent with calcifications. The appearancewas consistent with a fibrocystic lesion.Left unilateral diagnostic mammogram and ultrasound on 12/26/2019 revealed indeterminate 1.3 cm x 1 x 1.2 cm mixed cystic and solid mass in the left breast at the 9:30 position 10 cm from the nipple.  Ultrasound-guided biopsy on 01/03/2020 revealed fibrocystic changes with papillary apocrine metaplasia.  There was no atypia or malignancy.  Symptomatically, she feels "ok". She is losing weight very slowly because of a lot of stress in her life. She has been nauseous and has had no appetite. She takes oral iron. She takes a multivitamin that has vitamin B12.  Back pain is "terrible." Her dizziness is still bad and she is now taking meclizine. Leg swelling has resolved.  Plan: 1.   Review interval labs. 2.Recurrent pulmonary embolism Patientwithpulmonary embolism in 2006and2020.  Hypercoagulable work-up was negative.  PET scan revealed no evidence of malignancy She has an IVC filter in place. She is on chronic Coumadin. INR is monitored at home secondary to transportation issues.  Goal INR is 2-3.             Continue to monitor INR closely with Dr Tressia Miners.  No plan for Xarelto  or Eliquis s/p gastric bypass surgery. 3.  S/p gastric sleeve surgery             Discuss need for close monitoring of ferritin and B12 secondary to gastric bypass surgery.  Anticipate need for B12 and iron supplementation in future. 4.   RTC prn.  I discussed the assessment and treatment plan with the patient.  The patient was provided an opportunity to ask questions and all were answered.  The patient agreed with the plan and demonstrated an understanding of the instructions.  The patient was advised to call back if the symptoms worsen or if the condition fails to improve as anticipated.  I provided 16 minutes (4:08 PM - 4:24 PM) of face-to-face video visit time during this this encounter and > 50% was spent counseling as documented under my assessment and plan.  I provided these services from the [facility] office.  An additional 10 minutes were spent reviewing her chart (Epic and Care Everywhere) including notes, labs, and imaging studies.    Lequita Asal, MD, PhD    10/16/2020, 4:25 PM  I, Mirian Mo Tufford, am acting as a Education administrator for [employer]. Mike Gip, MD.   I, Tameah Mihalko C. Mike Gip, MD, have reviewed the above documentation for accuracy and completeness, and I agree with the above.

## 2020-10-16 ENCOUNTER — Encounter: Payer: Self-pay | Admitting: Hematology and Oncology

## 2020-10-16 ENCOUNTER — Inpatient Hospital Stay: Payer: Medicare Other | Attending: Hematology and Oncology | Admitting: Hematology and Oncology

## 2020-10-16 DIAGNOSIS — Z903 Acquired absence of stomach [part of]: Secondary | ICD-10-CM

## 2020-10-16 DIAGNOSIS — I2699 Other pulmonary embolism without acute cor pulmonale: Secondary | ICD-10-CM | POA: Diagnosis not present

## 2020-10-21 ENCOUNTER — Other Ambulatory Visit: Payer: Self-pay

## 2020-10-22 ENCOUNTER — Ambulatory Visit: Payer: Medicare Other | Admitting: Gastroenterology

## 2020-10-30 ENCOUNTER — Other Ambulatory Visit: Payer: Self-pay | Admitting: Internal Medicine

## 2020-10-30 DIAGNOSIS — N649 Disorder of breast, unspecified: Secondary | ICD-10-CM

## 2020-10-30 DIAGNOSIS — N644 Mastodynia: Secondary | ICD-10-CM

## 2020-11-28 ENCOUNTER — Other Ambulatory Visit: Payer: Self-pay | Admitting: Internal Medicine

## 2020-11-28 DIAGNOSIS — N649 Disorder of breast, unspecified: Secondary | ICD-10-CM

## 2020-11-28 DIAGNOSIS — N644 Mastodynia: Secondary | ICD-10-CM

## 2020-12-02 ENCOUNTER — Other Ambulatory Visit: Payer: Medicare Other

## 2020-12-03 ENCOUNTER — Ambulatory Visit
Admission: RE | Admit: 2020-12-03 | Discharge: 2020-12-03 | Disposition: A | Discharge status: Home / Self Care | Payer: Medicare Other | Source: Ambulatory Visit | Attending: Internal Medicine | Admitting: Internal Medicine

## 2020-12-03 ENCOUNTER — Other Ambulatory Visit: Payer: Self-pay

## 2020-12-03 DIAGNOSIS — N644 Mastodynia: Secondary | ICD-10-CM

## 2020-12-03 DIAGNOSIS — N649 Disorder of breast, unspecified: Secondary | ICD-10-CM

## 2021-02-04 ENCOUNTER — Telehealth: Payer: Self-pay | Admitting: *Deleted

## 2021-02-04 NOTE — Telephone Encounter (Signed)
Patient called stating she is having a procedure and has been told to stop her Coumadin 3 days prior to procedure. Her PCP manages her INR and coumadin dose and she is now on as needed follow up with [place] per Dr Kem Parkinson last note. Patient is inquiring if she will need Lovenox bridge for this time or not. Please advise

## 2021-02-05 NOTE — Telephone Encounter (Signed)
I attempted to call patient, but got voice mail, I left message to please call me back

## 2021-02-05 NOTE — Telephone Encounter (Signed)
Patient unable to come in due to being in [place] for unspecified time. She was unaware Dr Mike Gip left. She states eh will call the other doctor she sees at PCP office for assistance with this

## 2021-02-12 ENCOUNTER — Encounter
Admission: RE | Disposition: A | Discharge status: Home / Self Care | Payer: Self-pay | Source: Home / Self Care | Attending: Internal Medicine

## 2021-02-12 ENCOUNTER — Ambulatory Visit
Admission: RE | Admit: 2021-02-12 | Discharge: 2021-02-12 | Disposition: A | Discharge status: Home / Self Care | Payer: Medicare Other | Attending: Internal Medicine | Admitting: Internal Medicine

## 2021-02-12 ENCOUNTER — Ambulatory Visit: Payer: Medicare Other | Admitting: Certified Registered"

## 2021-02-12 ENCOUNTER — Encounter: Payer: Self-pay | Admitting: Internal Medicine

## 2021-02-12 DIAGNOSIS — K625 Hemorrhage of anus and rectum: Secondary | ICD-10-CM | POA: Diagnosis present

## 2021-02-12 DIAGNOSIS — Z8616 Personal history of COVID-19: Secondary | ICD-10-CM | POA: Diagnosis not present

## 2021-02-12 DIAGNOSIS — Z79899 Other long term (current) drug therapy: Secondary | ICD-10-CM | POA: Insufficient documentation

## 2021-02-12 DIAGNOSIS — Z882 Allergy status to sulfonamides status: Secondary | ICD-10-CM | POA: Diagnosis not present

## 2021-02-12 DIAGNOSIS — Z91030 Bee allergy status: Secondary | ICD-10-CM | POA: Diagnosis not present

## 2021-02-12 DIAGNOSIS — Z888 Allergy status to other drugs, medicaments and biological substances status: Secondary | ICD-10-CM | POA: Insufficient documentation

## 2021-02-12 DIAGNOSIS — Z79890 Hormone replacement therapy (postmenopausal): Secondary | ICD-10-CM | POA: Diagnosis not present

## 2021-02-12 DIAGNOSIS — Z881 Allergy status to other antibiotic agents status: Secondary | ICD-10-CM | POA: Diagnosis not present

## 2021-02-12 DIAGNOSIS — K5730 Diverticulosis of large intestine without perforation or abscess without bleeding: Secondary | ICD-10-CM | POA: Diagnosis not present

## 2021-02-12 DIAGNOSIS — K580 Irritable bowel syndrome with diarrhea: Secondary | ICD-10-CM | POA: Insufficient documentation

## 2021-02-12 DIAGNOSIS — Z880 Allergy status to penicillin: Secondary | ICD-10-CM | POA: Diagnosis not present

## 2021-02-12 DIAGNOSIS — K219 Gastro-esophageal reflux disease without esophagitis: Secondary | ICD-10-CM | POA: Diagnosis present

## 2021-02-12 DIAGNOSIS — Z7901 Long term (current) use of anticoagulants: Secondary | ICD-10-CM | POA: Insufficient documentation

## 2021-02-12 DIAGNOSIS — K2970 Gastritis, unspecified, without bleeding: Secondary | ICD-10-CM | POA: Diagnosis not present

## 2021-02-12 DIAGNOSIS — Z9884 Bariatric surgery status: Secondary | ICD-10-CM | POA: Diagnosis not present

## 2021-02-12 DIAGNOSIS — Z91018 Allergy to other foods: Secondary | ICD-10-CM | POA: Diagnosis not present

## 2021-02-12 DIAGNOSIS — K921 Melena: Secondary | ICD-10-CM | POA: Diagnosis not present

## 2021-02-12 DIAGNOSIS — K3189 Other diseases of stomach and duodenum: Secondary | ICD-10-CM | POA: Diagnosis not present

## 2021-02-12 DIAGNOSIS — K641 Second degree hemorrhoids: Secondary | ICD-10-CM | POA: Insufficient documentation

## 2021-02-12 HISTORY — PX: COLONOSCOPY WITH PROPOFOL: SHX5780

## 2021-02-12 HISTORY — PX: ESOPHAGOGASTRODUODENOSCOPY: SHX5428

## 2021-02-12 SURGERY — COLONOSCOPY WITH PROPOFOL
Anesthesia: General

## 2021-02-12 MED ORDER — PROPOFOL 500 MG/50ML IV EMUL
INTRAVENOUS | Status: DC | PRN
  Administered 2021-02-12: 150 ug/kg/min via INTRAVENOUS

## 2021-02-12 MED ORDER — KETAMINE HCL 10 MG/ML IJ SOLN
INTRAMUSCULAR | Status: DC | PRN
  Administered 2021-02-12: 30 mg via INTRAVENOUS

## 2021-02-12 MED ORDER — DEXMEDETOMIDINE (PRECEDEX) IN NS 20 MCG/5ML (4 MCG/ML) IV SYRINGE
PREFILLED_SYRINGE | INTRAVENOUS | Status: DC | PRN
  Administered 2021-02-12: 8 ug via INTRAVENOUS

## 2021-02-12 MED ORDER — LIDOCAINE HCL (CARDIAC) PF 100 MG/5ML IV SOSY
PREFILLED_SYRINGE | INTRAVENOUS | Status: DC | PRN
  Administered 2021-02-12: 100 mg via INTRAVENOUS

## 2021-02-12 MED ORDER — PROPOFOL 10 MG/ML IV BOLUS
INTRAVENOUS | Status: DC | PRN
  Administered 2021-02-12: 70 mg via INTRAVENOUS

## 2021-02-12 MED ORDER — GLYCOPYRROLATE 0.2 MG/ML IJ SOLN
INTRAMUSCULAR | Status: DC | PRN
  Administered 2021-02-12: .2 mg via INTRAVENOUS

## 2021-02-12 MED ORDER — PROPOFOL 10 MG/ML IV BOLUS
INTRAVENOUS | Status: AC
  Filled 2021-02-12: qty 20

## 2021-02-12 MED ORDER — KETAMINE HCL 50 MG/5ML IJ SOSY
PREFILLED_SYRINGE | INTRAMUSCULAR | Status: AC
  Filled 2021-02-12: qty 5

## 2021-02-12 MED ORDER — SODIUM CHLORIDE 0.9 % IV SOLN
INTRAVENOUS | Status: DC
  Administered 2021-02-12: 1000 mL via INTRAVENOUS

## 2021-02-12 NOTE — Op Note (Addendum)
[facility] Gastroenterology Patient Name: Angela [name] Procedure Date: 02/12/2021 12:00 PM MRN: [id number] Account #: [id number] Date of Birth: Jul 26, 1976 Admit Type: Outpatient Age: 45 Room: [facility] ENDO ROOM 2 Gender: Female Note Status: Supervisor Override Procedure:             Upper GI endoscopy Indications:           Preoperative assessment, Gastro-esophageal reflux                         disease Providers:             Benay Pike. Brylynn Hanssen MD, MD Medicines:             Propofol per Anesthesia Complications:         No immediate complications. Procedure:             Pre-Anesthesia Assessment:                        - The risks and benefits of the procedure and the                         sedation options and risks were discussed with the                         patient. All questions were answered and informed                         consent was obtained.                        - Patient identification and proposed procedure were                         verified prior to the procedure by the nurse. The                         procedure was verified in the procedure room.                        - ASA Grade Assessment: III - A patient with severe                         systemic disease.                        - After reviewing the risks and benefits, the patient                         was deemed in satisfactory condition to undergo the                         procedure.                        After obtaining informed consent, the endoscope was                         passed under direct vision. Throughout the procedure,  the patient's blood pressure, pulse, and oxygen                         saturations were monitored continuously. The Endoscope                         was introduced through the mouth, and advanced to the                         third part of duodenum. The upper GI endoscopy was                         accomplished  without difficulty. The patient tolerated                         the procedure well. Findings:      The esophagus was normal.      Evidence of a sleeve gastrectomy was found in the gastric body. This was       characterized by healthy appearing mucosa.      Patchy mildly erythematous mucosa without bleeding was found in the       gastric antrum. Biopsies were taken with a cold forceps for Helicobacter       pylori testing.      The examined duodenum was normal.      The exam was otherwise without abnormality. Impression:            - Normal esophagus.                        - A sleeve gastrectomy was found, characterized by                         healthy appearing mucosa.                        - Erythematous mucosa in the antrum. Biopsied.                        - Normal examined duodenum.                        - The examination was otherwise normal. Recommendation:        - Await pathology results.                        - Proceed with colonoscopy Procedure Code(s):     --- Professional ---                        [phone], Esophagogastroduodenoscopy, flexible,                         transoral; with biopsy, single or multiple Diagnosis Code(s):     --- Professional ---                        R11.0, Nausea                        R10.13, Epigastric pain  K31.89, Other diseases of stomach and duodenum                        Z98.84, Bariatric surgery status CPT copyright 2019 American Medical Association. All rights reserved. The codes documented in this report are preliminary and upon coder review may  be revised to meet current compliance requirements. Efrain Sella MD, MD 02/12/2021 12:14:50 PM This report has been signed electronically. Number of Addenda: 0 Note Initiated On: 02/12/2021 12:00 PM Estimated Blood Loss:  Estimated blood loss: none.      [facility]

## 2021-02-12 NOTE — Anesthesia Preprocedure Evaluation (Signed)
Anesthesia Evaluation  Patient identified by MRN, date of birth, ID band Patient awake    Reviewed: Allergy & Precautions, H&P , NPO status , Patient's Chart, lab work & pertinent test results, reviewed documented beta blocker date and time   History of Anesthesia Complications Negative for: history of anesthetic complications  Airway Mallampati: III  TM Distance: >3 FB Neck ROM: full    Dental  (+) Dental Advidsory Given, Caps, Teeth Intact   Pulmonary neg shortness of breath, asthma , neg sleep apnea, neg recent URI,    Pulmonary exam normal breath sounds clear to auscultation       Cardiovascular Exercise Tolerance: Good hypertension, (-) angina(-) Past MI and (-) Cardiac Stents Normal cardiovascular exam(-) dysrhythmias (-) Valvular Problems/Murmurs Rhythm:regular Rate:Normal     Neuro/Psych  Headaches, neg Seizures PSYCHIATRIC DISORDERS Anxiety Depression  Neuromuscular disease (fibromyalgia)    GI/Hepatic Neg liver ROS, GERD  ,  Endo/Other  neg diabetesHypothyroidism Morbid obesity  Renal/GU Renal disease (kidney stone)  negative genitourinary   Musculoskeletal   Abdominal   Peds  Hematology  (+) Blood dyscrasia, anemia ,   Anesthesia Other Findings Past Medical History: No date: Anemia No date: Anxiety No date: Asthma No date: Depression No date: Fibromyalgia No date: GERD (gastroesophageal reflux disease) No date: Hypertension No date: Irritable bowel syndrome (IBS) No date: Pseudotumor cerebri No date: Pulmonary embolism (HCC) No date: Raynaud's disease No date: Thyroid disease     Comment:  hypothyroid No date: Vision disturbance   Reproductive/Obstetrics negative OB ROS                             Anesthesia Physical  Anesthesia Plan  ASA: III  Anesthesia Plan: General   Post-op Pain Management:    Induction: Intravenous  PONV Risk Score and Plan: 3 and  Propofol infusion and TIVA  Airway Management Planned: Natural Airway and Nasal Cannula  Additional Equipment:   Intra-op Plan:   Post-operative Plan:   Informed Consent: I have reviewed the patients History and Physical, chart, labs and discussed the procedure including the risks, benefits and alternatives for the proposed anesthesia with the patient or authorized representative who has indicated his/her understanding and acceptance.     Dental Advisory Given  Plan Discussed with: Anesthesiologist, CRNA and Surgeon  Anesthesia Plan Comments:         Anesthesia Quick Evaluation

## 2021-02-12 NOTE — Op Note (Addendum)
[facility] Gastroenterology Patient Name: Angela [name] Procedure Date: 02/12/2021 11:59 AM MRN: [id number] Account #: [id number] Date of Birth: [birth year] Admit Type: Outpatient Age: 45 Room: [facility] ENDO ROOM 2 Gender: Female Note Status: Supervisor Override Procedure:             Colonoscopy Indications:           Rectal bleeding, Mixed irritable bowel syndrome,                         Abdominal pain Providers:             Benay Pike. Jenet Durio MD, MD Medicines:             Propofol per Anesthesia Complications:         No immediate complications. Procedure:             Pre-Anesthesia Assessment:                        - The risks and benefits of the procedure and the                         sedation options and risks were discussed with the                         patient. All questions were answered and informed                         consent was obtained.                        - Patient identification and proposed procedure were                         verified prior to the procedure by the nurse. The                         procedure was verified in the procedure room.                        - ASA Grade Assessment: III - A patient with severe                         systemic disease.                        - After reviewing the risks and benefits, the patient                         was deemed in satisfactory condition to undergo the                         procedure.                        After obtaining informed consent, the colonoscope was                         passed under direct vision. Throughout the procedure,  the patient's blood pressure, pulse, and oxygen                         saturations were monitored continuously. The                         Colonoscope was introduced through the anus and                         advanced to the the cecum, identified by appendiceal                         orifice and ileocecal valve.  The colonoscopy was                         performed without difficulty. The patient tolerated                         the procedure well. The quality of the bowel                         preparation was good. The ileocecal valve, appendiceal                         orifice, and rectum were photographed. Findings:      The perianal and digital rectal examinations were normal. Pertinent       negatives include normal sphincter tone and no palpable rectal lesions.      Many small and large-mouthed diverticula were found in the sigmoid colon.      Normal mucosa was found in the entire colon. Biopsies for histology were       taken with a cold forceps from the random colon for evaluation of       microscopic colitis.      There is no endoscopic evidence of bleeding, erythema, mass or polyps in       the entire colon.      Non-bleeding internal hemorrhoids were found during retroflexion. The       hemorrhoids were Grade II (internal hemorrhoids that prolapse but reduce       spontaneously).      The exam was otherwise without abnormality. Impression:            - Diverticulosis in the sigmoid colon.                        - Normal mucosa in the entire examined colon. Biopsied.                        - Non-bleeding internal hemorrhoids.                        - The examination was otherwise normal. Recommendation:        - Await pathology results from EGD, also performed                         today.                        - Patient has a contact number available for  emergencies. The signs and symptoms of potential                         delayed complications were discussed with the patient.                         Return to normal activities tomorrow. Written                         discharge instructions were provided to the patient.                        - Resume previous diet.                        - Continue present medications.                        -  Await pathology results.                        - Repeat colonoscopy in 10 years for screening                         purposes.                        - Return to Dr. Barrie Dunker at the next available                         appointment.                        - Return to GI office in 3 months.                        - Follow up with Laurine Blazer, PA-C at [facility]. [phone].                        - The findings and recommendations were discussed with                         the patient. Procedure Code(s):     --- Professional ---                        [phone], Colonoscopy, flexible; with biopsy, single or                         multiple Diagnosis Code(s):     --- Professional ---                        K57.30, Diverticulosis of large intestine without                         perforation or abscess without bleeding                        R19.4, Change in bowel  habit                        K92.1, Melena (includes Hematochezia)                        R10.30, Lower abdominal pain, unspecified                        K64.1, Second degree hemorrhoids CPT copyright 2019 American Medical Association. All rights reserved. The codes documented in this report are preliminary and upon coder review may  be revised to meet current compliance requirements. Efrain Sella MD, MD 02/12/2021 12:46:22 PM This report has been signed electronically. Number of Addenda: 0 Note Initiated On: 02/12/2021 11:59 AM Scope Withdrawal Time: 0 hours 8 minutes 32 seconds  Total Procedure Duration: 0 hours 12 minutes 29 seconds  Estimated Blood Loss:  Estimated blood loss: none.      [facility]

## 2021-02-12 NOTE — H&P (Signed)
Outpatient short stay form Pre-procedure 02/12/2021 11:46 AM Angela [name] K. Alice Reichert, M.D.  Primary Physician: Gladstone Lighter, M.D.  Reason for visit:  Abdominal pain  History of present illness:  45 y/o patient presents with IBS symptoms with diarrhea with hematochezia. Has history of hemorrhoids.  10/2020-CMP normal. CBC w/ WBC 11.7. TSH 5.481.   05/2019-normal esophagus, normal GE junction, gastritis, normal duodenum. Path-Gastric mucosa with no significant pathologic abnormality. No evidence of Helicobacter pylori.  colonoscopy on 05/27/2019-she has revealed nonbleeding internal hemorrhoids recommending Anusol suppository twice a day for 10 days  05/2019-IMPRESSION:  No evidence of GI bleed     Current Facility-Administered Medications:    0.9 %  sodium chloride infusion, , Intravenous, Continuous, Lillith Mcneff, Benay Pike, MD, Last Rate: 20 mL/hr at 02/12/21 1047, 1,000 mL at 02/12/21 1047  Medications Prior to Admission  Medication Sig Dispense Refill Last Dose   acetaminophen (TYLENOL) 500 MG tablet Take 1,000-1,500 mg by mouth every 6 (six) hours as needed for moderate pain or headache.   Past Week   albuterol (PROVENTIL HFA;VENTOLIN HFA) 108 (90 BASE) MCG/ACT inhaler Inhale 2 puffs into the lungs every 6 (six) hours as needed for wheezing.   Past Week   budesonide (PULMICORT) 0.5 MG/2ML nebulizer solution Take 2 mLs by nebulization 2 (two) times daily.   Past Week   Carboxymethylcellulose Sodium (REFRESH LIQUIGEL OP) Place 1 drop into both eyes daily as needed (dry eyes).   02/11/2021   Cholecalciferol 125 MCG (5000 UT) TABS Take 1 tablet by mouth daily.   Past Week   clindamycin (CLEOCIN T) 1 % lotion Apply 1 application topically 2 (two) times daily as needed (acne).   02/11/2021   cyclobenzaprine (FLEXERIL) 10 MG tablet Take 1 tablet (10 mg total) by mouth as needed. 30 tablet 3 Past Week   diphenhydrAMINE (BENADRYL) 25 mg capsule Take 25 mg by mouth 2 (two) times daily as needed for  allergies or sleep.    Past Week   enoxaparin (LOVENOX) 100 MG/ML injection Inject 100 mg into the skin.   02/11/2021   EPINEPHrine 0.3 mg/0.3 mL IJ SOAJ injection Inject 0.3 mg into the muscle as needed for anaphylaxis.   Past Month   famotidine (PEPCID) 40 MG tablet Take 40 mg by mouth at bedtime.   Past Week   ferrous sulfate 325 (65 FE) MG EC tablet Take 1 tablet (325 mg total) by mouth 2 (two) times daily. 60 tablet 3 Past Week   fluticasone (FLONASE) 50 MCG/ACT nasal spray Place 1 spray into both nostrils daily as needed for rhinitis.   Past Week   gabapentin (NEURONTIN) 300 MG capsule Take 300 mg by mouth 3 (three) times daily.   Past Week   Galcanezumab-gnlm 120 MG/ML SOAJ Inject 120 mg into the skin every 28 (twenty-eight) days.    Past Week   hydrocortisone (ANUSOL-HC) 25 MG suppository Place 1 suppository (25 mg total) rectally 2 (two) times daily. 12 suppository 0 Past Month   hyoscyamine (LEVSIN) 0.125 MG/5ML ELIX Take 0.125 mg by mouth.   Past Month   levothyroxine (SYNTHROID) 137 MCG tablet Take 137 mcg by mouth daily.   02/11/2021   linaclotide (LINZESS) 72 MCG capsule Take 72 mcg by mouth daily as needed (constipation).   Past Month   loratadine (CLARITIN) 10 MG tablet Take 10 mg by mouth daily.   Past Week   meclizine (ANTIVERT) 25 MG tablet Take 25 mg by mouth as needed.   Past Week   Melatonin 3  MG CAPS Take 3 mg by mouth at bedtime as needed (sleep).   Past Week   Multiple Vitamin (MULTIVITAMIN WITH MINERALS) TABS tablet Take 1 tablet by mouth daily.   Past Week   ondansetron (ZOFRAN) 8 MG tablet Take 8 mg by mouth every 8 (eight) hours as needed for nausea or vomiting.   Past Week   oxybutynin (DITROPAN-XL) 5 MG 24 hr tablet Take 5 mg by mouth daily.   Past Week   polyethylene glycol (MIRALAX / GLYCOLAX) 17 g packet PLEASE SEE ATTACHED FOR DETAILED DIRECTIONS   02/11/2021   prednisoLONE acetate (PRED FORTE) 1 % ophthalmic suspension Place 1 drop into both eyes every 4 (four) hours  as needed (uveitis).   Past Month   promethazine (PHENERGAN) 25 MG tablet Take 25 mg by mouth every 6 (six) hours as needed for nausea or vomiting.   Past Week   propranolol (INDERAL) 10 MG tablet    Past Week   rosuvastatin (CRESTOR) 20 MG tablet Take 20 mg by mouth daily.   Past Week   tiZANidine (ZANAFLEX) 4 MG tablet Take 4 mg by mouth every 12 (twelve) hours as needed for muscle spasms.   Past Week   topiramate (TOPAMAX) 50 MG tablet Take 50 mg by mouth 2 (two) times daily.      traMADol (ULTRAM) 50 MG tablet Take 1 tablet (50 mg total) by mouth every 6 (six) hours as needed for moderate pain. 12 tablet 0 Past Month   Ubrogepant 50 MG TABS Take 1 tablet by mouth as needed. May repeat in 2 hours   Past Week   venlafaxine XR (EFFEXOR-XR) 37.5 MG 24 hr capsule Take 37.5 mg by mouth daily with breakfast.      montelukast (SINGULAIR) 10 MG tablet Take 10 mg by mouth at bedtime. (Patient not taking: Reported on 10/16/2020)      sodium fluoride (FLUORISHIELD) 1.1 % GEL dental gel Place 1 application onto teeth at bedtime.  (Patient not taking: Reported on 10/16/2020)      warfarin (COUMADIN) 10 MG tablet Take 10 mg by mouth daily.   02/07/2021 at 1500   warfarin (COUMADIN) 3 MG tablet Take by mouth.        Allergies  Allergen Reactions   Bee Venom Other (See Comments), Nausea Only, Rash and Shortness Of Breath   Bupropion Other (See Comments)   Lisinopril Cough   Amoxicillin Hives    Did it involve swelling of the face/tongue/throat, SOB, or low BP? No Did it involve sudden or severe rash/hives, skin peeling, or any reaction on the inside of your mouth or nose? No Did you need to seek medical attention at a hospital or doctor's office? Unknown When did it last happen?      5+ years If all above answers are "NO", may proceed with cephalosporin use.    [name]     Break out in mouth   Nortriptyline     Unknown reaction    Orange Fruit [Citrus]     Break out in mouth   Other     Sulfa eye  drops- Scratched corneas   Penicillins Hives    Did it involve swelling of the face/tongue/throat, SOB, or low BP? No Did it involve sudden or severe rash/hives, skin peeling, or any reaction on the inside of your mouth or nose? No Did you need to seek medical attention at a hospital or doctor's office? Unknown When did it last happen?  5+ years If all above answers are "NO", may proceed with cephalosporin use.    Sulfa Antibiotics    Milnacipran Palpitations     Past Medical History:  Diagnosis Date   Anemia    Anxiety    Asthma    COVID-19 08/26/2020   Tested positive on 08/26/2020   Depression    Fibromyalgia    GERD (gastroesophageal reflux disease)    History of GI diverticular bleed    Hypertension    Irritable bowel syndrome (IBS)    Pseudotumor cerebri    Pulmonary embolism (HCC)    Raynaud's disease    Thyroid disease    hypothyroid   Vision disturbance     Review of systems:  Otherwise negative.    Physical Exam  Gen: Alert, oriented. Appears stated age.  HEENT: [place]/AT. PERRLA. Lungs: CTA, no wheezes. CV: RR nl S1, S2. Abd: soft, benign, no masses. BS+ Ext: No edema. Pulses 2+    Planned procedures: Proceed with EGD and colonoscopy. The patient understands the nature of the planned procedure, indications, risks, alternatives and potential complications including but not limited to bleeding, infection, perforation, damage to internal organs and possible oversedation/side effects from anesthesia. The patient agrees and gives consent to proceed.  Please refer to procedure notes for findings, recommendations and patient disposition/instructions.     Tylia Ewell K. Alice Reichert, M.D. Gastroenterology 02/12/2021  11:46 AM

## 2021-02-12 NOTE — Anesthesia Postprocedure Evaluation (Signed)
Anesthesia Post Note  Patient: Angela [name]  Procedure(s) Performed: COLONOSCOPY WITH PROPOFOL ESOPHAGOGASTRODUODENOSCOPY (EGD)  Patient location during evaluation: Endoscopy Anesthesia Type: General Level of consciousness: awake and alert Pain management: pain level controlled Vital Signs Assessment: post-procedure vital signs reviewed and stable Respiratory status: spontaneous breathing, nonlabored ventilation, respiratory function stable and patient connected to nasal cannula oxygen Cardiovascular status: blood pressure returned to baseline and stable Postop Assessment: no apparent nausea or vomiting Anesthetic complications: no   No notable events documented.   Last Vitals:  Vitals:   02/12/21 1232 02/12/21 1252  BP: 121/81 (!) 130/99  Pulse: 77   Resp: 19   Temp: (!) 36.3 C   SpO2: 99%     Last Pain:  Vitals:   02/12/21 1302  TempSrc:   PainSc: 0-No pain                 Martha Clan

## 2021-02-12 NOTE — Transfer of Care (Signed)
Immediate Anesthesia Transfer of Care Note  Patient: Angela [name]  Procedure(s) Performed: COLONOSCOPY WITH PROPOFOL ESOPHAGOGASTRODUODENOSCOPY (EGD)  Patient Location: PACU  Anesthesia Type:General  Level of Consciousness: awake  Airway & Oxygen Therapy: Patient Spontanous Breathing  Post-op Assessment: Report given to RN and Post -op Vital signs reviewed and stable  Post vital signs: stable  Last Vitals:  Vitals Value Taken Time  BP 121/81 02/12/21 1233  Temp    Pulse 76 02/12/21 1233  Resp 18 02/12/21 1233  SpO2 100 % 02/12/21 1233  Vitals shown include unvalidated device data.  Last Pain:  Vitals:   02/12/21 1034  TempSrc: Temporal  PainSc: 6          Complications: No notable events documented.

## 2021-02-13 ENCOUNTER — Encounter: Payer: Self-pay | Admitting: Internal Medicine

## 2021-02-13 LAB — SURGICAL PATHOLOGY

## 2021-02-13 NOTE — OR Nursing (Signed)
PT. States that she had a temparture of 100.6 and was shaking @one  AM today. She left a message on my chart but did not call. I will let DR. [place] know.

## 2021-02-18 ENCOUNTER — Inpatient Hospital Stay: Payer: Medicare Other | Admitting: Oncology

## 2021-03-06 ENCOUNTER — Ambulatory Visit: Payer: Medicare Other | Admitting: Oncology

## 2021-04-01 ENCOUNTER — Ambulatory Visit (INDEPENDENT_AMBULATORY_CARE_PROVIDER_SITE_OTHER): Payer: Medicare Other | Admitting: Vascular Surgery

## 2022-06-08 ENCOUNTER — Encounter (INDEPENDENT_AMBULATORY_CARE_PROVIDER_SITE_OTHER): Payer: Self-pay
